# Patient Record
Sex: Female | Born: 1980 | Race: White | Hispanic: No | Smoking: Never smoker
Health system: Southern US, Community
[De-identification: ages and names within clinical notes are randomized; demographics above are authoritative.]

## PROBLEM LIST (undated history)

## (undated) DIAGNOSIS — G43909 Migraine, unspecified, not intractable, without status migrainosus: Secondary | ICD-10-CM

## (undated) DIAGNOSIS — I10 Essential (primary) hypertension: Secondary | ICD-10-CM

## (undated) DIAGNOSIS — F329 Major depressive disorder, single episode, unspecified: Secondary | ICD-10-CM

## (undated) DIAGNOSIS — F419 Anxiety disorder, unspecified: Secondary | ICD-10-CM

## (undated) DIAGNOSIS — R002 Palpitations: Secondary | ICD-10-CM

## (undated) DIAGNOSIS — M999 Biomechanical lesion, unspecified: Secondary | ICD-10-CM

## (undated) DIAGNOSIS — M542 Cervicalgia: Secondary | ICD-10-CM

## (undated) DIAGNOSIS — M6289 Other specified disorders of muscle: Secondary | ICD-10-CM

## (undated) DIAGNOSIS — F32A Depression, unspecified: Secondary | ICD-10-CM

## (undated) DIAGNOSIS — R030 Elevated blood-pressure reading, without diagnosis of hypertension: Secondary | ICD-10-CM

## (undated) HISTORY — DX: Biomechanical lesion, unspecified: M99.9

## (undated) HISTORY — DX: Depression, unspecified: F32.A

## (undated) HISTORY — DX: Cervicalgia: M54.2

## (undated) HISTORY — DX: Migraine, unspecified, not intractable, without status migrainosus: G43.909

## (undated) HISTORY — DX: Major depressive disorder, single episode, unspecified: F32.9

## (undated) HISTORY — DX: Anxiety disorder, unspecified: F41.9

## (undated) HISTORY — DX: Other specified disorders of muscle: M62.89

## (undated) HISTORY — PX: BREAST SURGERY: SHX581

## (undated) HISTORY — DX: Palpitations: R00.2

## (undated) HISTORY — DX: Elevated blood-pressure reading, without diagnosis of hypertension: R03.0

## (undated) HISTORY — PX: BREAST ENHANCEMENT SURGERY: SHX7

## (undated) HISTORY — PX: COSMETIC SURGERY: SHX468

---

## 2017-08-13 ENCOUNTER — Telehealth: Payer: Self-pay | Admitting: Internal Medicine

## 2017-08-13 NOTE — Telephone Encounter (Signed)
Yes, I will accept her 

## 2017-08-13 NOTE — Telephone Encounter (Signed)
See message below.  Would you be willing to establish care with this pt?

## 2017-08-13 NOTE — Telephone Encounter (Signed)
Copied from CRM [id number]. Topic: Appointment Scheduling - New Patient >> Aug 13, 2017  4:28 PM Windy KalataMichael, Taylor L, NT wrote: Reason for CRM: Pt is new to the area, her husband is in the [employer], her sister sees Dr. Talitha GivensBurns Margaret Start [birth year] -- she states her sister has talked to [name] about her coming to the area and would like to est care with her. If Dr. Lawerance BachBurns is willing to take her as a new pt please contact pt with appt date. Thank you  New patient has been scheduled for your office.

## 2017-08-14 NOTE — Telephone Encounter (Signed)
Appointment scheduled.

## 2017-09-08 ENCOUNTER — Emergency Department (HOSPITAL_COMMUNITY)

## 2017-09-08 ENCOUNTER — Encounter (HOSPITAL_COMMUNITY): Payer: Self-pay

## 2017-09-08 ENCOUNTER — Other Ambulatory Visit: Payer: Self-pay

## 2017-09-08 ENCOUNTER — Emergency Department (HOSPITAL_COMMUNITY)
Admission: EM | Admit: 2017-09-08 | Discharge: 2017-09-08 | Disposition: A | Discharge status: Home / Self Care | Attending: Emergency Medicine | Admitting: Emergency Medicine

## 2017-09-08 DIAGNOSIS — I10 Essential (primary) hypertension: Secondary | ICD-10-CM | POA: Insufficient documentation

## 2017-09-08 DIAGNOSIS — G43109 Migraine with aura, not intractable, without status migrainosus: Secondary | ICD-10-CM | POA: Insufficient documentation

## 2017-09-08 DIAGNOSIS — R5383 Other fatigue: Secondary | ICD-10-CM | POA: Diagnosis not present

## 2017-09-08 DIAGNOSIS — R51 Headache: Secondary | ICD-10-CM | POA: Diagnosis present

## 2017-09-08 HISTORY — DX: Essential (primary) hypertension: I10

## 2017-09-08 LAB — I-STAT CHEM 8, ED
BUN: 7 mg/dL (ref 6–20)
CHLORIDE: 103 mmol/L (ref 101–111)
CREATININE: 1 mg/dL (ref 0.44–1.00)
Calcium, Ion: 1.21 mmol/L (ref 1.15–1.40)
GLUCOSE: 112 mg/dL — AB (ref 65–99)
HEMATOCRIT: 35 % — AB (ref 36.0–46.0)
Hemoglobin: 11.9 g/dL — ABNORMAL LOW (ref 12.0–15.0)
POTASSIUM: 4.5 mmol/L (ref 3.5–5.1)
Sodium: 141 mmol/L (ref 135–145)
TCO2: 26 mmol/L (ref 22–32)

## 2017-09-08 LAB — I-STAT BETA HCG BLOOD, ED (MC, WL, AP ONLY): I-stat hCG, quantitative: <5 m[IU]/mL (ref <5)

## 2017-09-08 MED ORDER — IOPAMIDOL (ISOVUE-370) INJECTION 76%
INTRAVENOUS | Status: AC
  Filled 2017-09-08: qty 100

## 2017-09-08 MED ORDER — DIPHENHYDRAMINE HCL 50 MG/ML IJ SOLN
25.0000 mg | Freq: Once | INTRAMUSCULAR | Status: AC
  Administered 2017-09-08: 25 mg via INTRAVENOUS
  Filled 2017-09-08: qty 1

## 2017-09-08 MED ORDER — PROCHLORPERAZINE EDISYLATE 5 MG/ML IJ SOLN
10.0000 mg | Freq: Once | INTRAMUSCULAR | Status: AC
  Administered 2017-09-08: 10 mg via INTRAVENOUS
  Filled 2017-09-08: qty 2

## 2017-09-08 MED ORDER — PROCHLORPERAZINE MALEATE 10 MG PO TABS: 10.0000 mg | ORAL_TABLET | Freq: Three times a day (TID) | ORAL | 0 refills | Status: AC | PRN

## 2017-09-08 MED ORDER — IOPAMIDOL (ISOVUE-370) INJECTION 76%
100.0000 mL | Freq: Once | INTRAVENOUS | Status: AC | PRN
  Administered 2017-09-08: 100 mL via INTRAVENOUS

## 2017-09-08 MED ORDER — BUTALBITAL-APAP-CAFFEINE 50-325-40 MG PO TABS: 1.0000 | ORAL_TABLET | Freq: Four times a day (QID) | ORAL | 0 refills | Status: AC | PRN

## 2017-09-08 MED ORDER — HALOPERIDOL LACTATE 5 MG/ML IJ SOLN
2.0000 mg | Freq: Once | INTRAMUSCULAR | Status: AC
  Administered 2017-09-08: 2 mg via INTRAVENOUS
  Filled 2017-09-08: qty 1

## 2017-09-08 MED ORDER — SODIUM CHLORIDE 0.9 % IV BOLUS (SEPSIS)
1000.0000 mL | Freq: Once | INTRAVENOUS | Status: AC
  Administered 2017-09-08: 1000 mL via INTRAVENOUS

## 2017-09-08 MED ORDER — DEXAMETHASONE SODIUM PHOSPHATE 10 MG/ML IJ SOLN
10.0000 mg | Freq: Once | INTRAMUSCULAR | Status: AC
  Administered 2017-09-08: 10 mg via INTRAVENOUS
  Filled 2017-09-08: qty 1

## 2017-09-08 MED ORDER — BUTALBITAL-APAP-CAFFEINE 50-325-40 MG PO TABS: 1.0000 | ORAL_TABLET | Freq: Four times a day (QID) | ORAL | 0 refills | Status: DC | PRN

## 2017-09-08 MED ORDER — KETOROLAC TROMETHAMINE 15 MG/ML IJ SOLN
15.0000 mg | Freq: Once | INTRAMUSCULAR | Status: AC
  Administered 2017-09-08: 15 mg via INTRAVENOUS
  Filled 2017-09-08: qty 1

## 2017-09-08 MED ORDER — PROCHLORPERAZINE MALEATE 10 MG PO TABS: 10.0000 mg | ORAL_TABLET | Freq: Three times a day (TID) | ORAL | 0 refills | Status: DC | PRN

## 2017-09-08 NOTE — ED Provider Notes (Signed)
[facility]-EMERGENCY DEPT Provider Note   CSN: [id number] Arrival date & time: 09/08/17  1431     History   Chief Complaint Chief Complaint  Patient presents with  . Headache    HPI Margaret Deleon is a 36 y.o. female.  HPI   36 year old with past medical history of chronic tension type headaches who presents with acute onset headache.  The patient was in her usual state of health until approximately 1:30 PM.  She has been under significantly increased stress and did not sleep much overnight due to stress from the holidays.  She states that around 130, she developed acute onset of severe, sharp, stabbing, left-sided headache with occasional radiation to her neck.  She felt "fuzzy headed" and had some occasional word finding difficulty as well as tingling in her left arm.  She also reports tingling in her right face.  She denies any lower extremity symptoms.  She tried to sit down to see if the headache would go away and it has not so she subsequently presents.  She has an extensive history of headaches and has previously seen neurology.  She also has a family history of headaches.  Denies any history of aneurysms.  No kidney issues.  No other medical complaints.  No fevers or chills.  No neck pain or stiffness.  Past Medical History:  Diagnosis Date  . Hypertension     There are no active problems to display for this patient.     OB History    No data available       Home Medications    Prior to Admission medications   Medication Sig Start Date End Date Taking? Authorizing Provider  cyclobenzaprine (FLEXERIL) 10 MG tablet Take 10 mg by mouth 3 (three) times daily as needed for muscle spasms.   Yes [provider]  labetalol (NORMODYNE) 100 MG tablet Take 100 mg by mouth 2 (two) times daily.   Yes [provider]  LORazepam (ATIVAN) 0.5 MG tablet Take 0.5 mg by mouth daily as needed for anxiety. 08/15/17  Yes [provider]  butalbital-acetaminophen-caffeine (FIORICET, ESGIC) 50-325-40 MG tablet Take 1-2 tablets by mouth every 6 (six) hours as needed for headache. 09/08/17 09/08/18  Shaune Pollack, MD  prochlorperazine (COMPAZINE) 10 MG tablet Take 1 tablet (10 mg total) by mouth every 8 (eight) hours as needed for refractory nausea / vomiting (refractory headache). 09/08/17   Shaune Pollack, MD    Family History No family history on file.  Social History Social History   Tobacco Use  . Smoking status: Not on file  Substance Use Topics  . Alcohol use: Not on file  . Drug use: Not on file     Allergies   Codeine   Review of Systems Review of Systems  Constitutional: Positive for fatigue. Negative for chills and fever.  HENT: Negative for congestion, rhinorrhea and sore throat.   Eyes: Negative for visual disturbance.  Respiratory: Negative for cough, shortness of breath and wheezing.   Cardiovascular: Negative for chest pain and leg swelling.  Gastrointestinal: Negative for abdominal pain, diarrhea, nausea and vomiting.  Genitourinary: Negative for dysuria, flank pain, vaginal bleeding and vaginal discharge.  Musculoskeletal: Negative for neck pain.  Skin: Negative for rash.  Allergic/Immunologic: Negative for immunocompromised state.  Neurological: Positive for numbness and headaches. Negative for syncope.  Hematological: Does not bruise/bleed easily.  Psychiatric/Behavioral: The patient is nervous/anxious.   All other systems reviewed and are negative.    Physical  Exam Updated Vital Signs BP (!) 130/108   Pulse 87   Temp 98.3 F (36.8 C) (Oral)   Resp 16   LMP 09/04/2017 (Exact Date)   SpO2 100%   Physical Exam  Constitutional: She is oriented to person, place, and time. She appears well-developed and well-nourished. No distress.  HENT:  Head: Normocephalic and atraumatic.  Eyes: Conjunctivae are normal.  Neck: Neck supple.  Cardiovascular: Normal rate, regular rhythm and  normal heart sounds. Exam reveals no friction rub.  No murmur heard. Pulmonary/Chest: Effort normal and breath sounds normal. No respiratory distress. She has no wheezes. She has no rales.  Abdominal: She exhibits no distension.  Musculoskeletal: She exhibits no edema.  Neurological: She is alert and oriented to person, place, and time. She exhibits normal muscle tone.  Skin: Skin is warm. Capillary refill takes less than 2 seconds.  Psychiatric: Her mood appears anxious.  Nursing note and vitals reviewed.   Neurological Exam:  Mental Status: Alert and oriented to person, place, and time. Attention and concentration normal. Speech clear. Recent memory is intact. Cranial Nerves: Visual fields grossly intact. EOMI and PERRLA. No nystagmus noted. Facial sensation intact at forehead, maxillary cheek, and chin/mandible bilaterally. No facial asymmetry or weakness. Hearing grossly normal. Uvula is midline, and palate elevates symmetrically. Normal SCM and trapezius strength. Tongue midline without fasciculations. Motor: Muscle strength 5/5 in proximal and distal UE and LE bilaterally. No pronator drift. Muscle tone normal. Reflexes: 2+ and symmetrical in all four extremities.  Sensation: Intact to light touch in upper and lower extremities distally bilaterally.  Gait: Normal without ataxia. Coordination: Normal FTN bilaterally.    ED Treatments / Results  Labs (all labs ordered are listed, but only abnormal results are displayed) Labs Reviewed  I-STAT CHEM 8, ED - Abnormal; Notable for the following components:      Result Value   Glucose, Bld 112 (*)    Hemoglobin 11.9 (*)    HCT 35.0 (*)    All other components within normal limits  I-STAT BETA HCG BLOOD, ED ([facility], [facility], [facility] ONLY)    EKG  EKG Interpretation None       Radiology Ct Angio Head W Or Wo Contrast  Result Date: 09/08/2017 CLINICAL DATA:  Sharp LEFT head pain, LEFT arm numbness and tingling, near syncope, expressive  aphasia. Hypertension. EXAM: CT ANGIOGRAPHY HEAD AND NECK TECHNIQUE: Multidetector CT imaging of the head and neck was performed using the standard protocol during bolus administration of intravenous contrast. Multiplanar CT image reconstructions and MIPs were obtained to evaluate the vascular anatomy. Carotid stenosis measurements (when applicable) are obtained utilizing NASCET criteria, using the distal internal carotid diameter as the denominator. CONTRAST:  100mL ISOVUE-370 IOPAMIDOL (ISOVUE-370) INJECTION 76% COMPARISON:  None. FINDINGS: CT HEAD FINDINGS BRAIN: No intraparenchymal hemorrhage, mass effect nor midline shift. The ventricles and sulci are normal. No acute large vascular territory infarcts. No abnormal extra-axial fluid collections. Basal cisterns are patent. VASCULAR: Unremarkable. SKULL/SOFT TISSUES: No skull fracture. No significant soft tissue swelling. ORBITS/SINUSES: The included ocular globes and orbital contents are normal.The mastoid aircells and included paranasal sinuses are well-aerated. OTHER: None. CTA NECK AORTIC ARCH: Normal appearance of the thoracic arch, normal branch pattern. The origins of the innominate, left Common carotid artery and subclavian artery are widely patent. RIGHT CAROTID SYSTEM: Common carotid artery is widely patent, coursing in a straight line fashion. Normal appearance of the carotid bifurcation without hemodynamically significant stenosis by NASCET criteria. Normal appearance of the internal carotid  artery. LEFT CAROTID SYSTEM: Common carotid artery is widely patent, coursing in a straight line fashion. Normal appearance of the carotid bifurcation without hemodynamically significant stenosis by NASCET criteria. Normal appearance of the internal carotid artery. VERTEBRAL ARTERIES:RIGHT vertebral artery is dominant. Normal appearance of the vertebral arteries, widely patent. SKELETON: No acute osseous process though bone windows have not been submitted. No  osseous canal stenosis or neural foraminal narrowing. OTHER NECK: Soft tissues of the neck are nonacute though, not tailored for evaluation. UPPER CHEST: Included lung apices are clear. No superior mediastinal lymphadenopathy. CTA HEAD ANTERIOR CIRCULATION: Patent cervical internal carotid arteries, petrous, cavernous and supra clinoid internal carotid arteries. Patent anterior communicating artery. Patent anterior and middle cerebral arteries. No large vessel occlusion, significant stenosis, contrast extravasation or aneurysm. POSTERIOR CIRCULATION: LEFT vertebral artery terminates in the posterior inferior cerebellar artery. Patent vertebral arteries, vertebrobasilar junction and basilar artery, as well as main branch vessels. Patent posterior cerebral arteries. Robust bilateral posterior communicating artery's. No large vessel occlusion, significant stenosis, contrast extravasation or aneurysm. VENOUS SINUSES: Major dural venous sinuses are patent though not tailored for evaluation on this angiographic examination. ANATOMIC VARIANTS: None. DELAYED PHASE: No abnormal intracranial enhancement. MIP images reviewed. IMPRESSION: 1. Normal CT HEAD with and without contrast. 2. Normal CTA neck. 3. Normal CTA HEAD. Electronically Signed   By: Awilda Metroourtnay  Bloomer M.D.   On: 09/08/2017 18:02   Ct Angio Neck W And/or Wo Contrast  Result Date: 09/08/2017 CLINICAL DATA:  Sharp LEFT head pain, LEFT arm numbness and tingling, near syncope, expressive aphasia. Hypertension. EXAM: CT ANGIOGRAPHY HEAD AND NECK TECHNIQUE: Multidetector CT imaging of the head and neck was performed using the standard protocol during bolus administration of intravenous contrast. Multiplanar CT image reconstructions and MIPs were obtained to evaluate the vascular anatomy. Carotid stenosis measurements (when applicable) are obtained utilizing NASCET criteria, using the distal internal carotid diameter as the denominator. CONTRAST:  100mL ISOVUE-370  IOPAMIDOL (ISOVUE-370) INJECTION 76% COMPARISON:  None. FINDINGS: CT HEAD FINDINGS BRAIN: No intraparenchymal hemorrhage, mass effect nor midline shift. The ventricles and sulci are normal. No acute large vascular territory infarcts. No abnormal extra-axial fluid collections. Basal cisterns are patent. VASCULAR: Unremarkable. SKULL/SOFT TISSUES: No skull fracture. No significant soft tissue swelling. ORBITS/SINUSES: The included ocular globes and orbital contents are normal.The mastoid aircells and included paranasal sinuses are well-aerated. OTHER: None. CTA NECK AORTIC ARCH: Normal appearance of the thoracic arch, normal branch pattern. The origins of the innominate, left Common carotid artery and subclavian artery are widely patent. RIGHT CAROTID SYSTEM: Common carotid artery is widely patent, coursing in a straight line fashion. Normal appearance of the carotid bifurcation without hemodynamically significant stenosis by NASCET criteria. Normal appearance of the internal carotid artery. LEFT CAROTID SYSTEM: Common carotid artery is widely patent, coursing in a straight line fashion. Normal appearance of the carotid bifurcation without hemodynamically significant stenosis by NASCET criteria. Normal appearance of the internal carotid artery. VERTEBRAL ARTERIES:RIGHT vertebral artery is dominant. Normal appearance of the vertebral arteries, widely patent. SKELETON: No acute osseous process though bone windows have not been submitted. No osseous canal stenosis or neural foraminal narrowing. OTHER NECK: Soft tissues of the neck are nonacute though, not tailored for evaluation. UPPER CHEST: Included lung apices are clear. No superior mediastinal lymphadenopathy. CTA HEAD ANTERIOR CIRCULATION: Patent cervical internal carotid arteries, petrous, cavernous and supra clinoid internal carotid arteries. Patent anterior communicating artery. Patent anterior and middle cerebral arteries. No large vessel occlusion, significant  stenosis, contrast extravasation or aneurysm.  POSTERIOR CIRCULATION: LEFT vertebral artery terminates in the posterior inferior cerebellar artery. Patent vertebral arteries, vertebrobasilar junction and basilar artery, as well as main branch vessels. Patent posterior cerebral arteries. Robust bilateral posterior communicating artery's. No large vessel occlusion, significant stenosis, contrast extravasation or aneurysm. VENOUS SINUSES: Major dural venous sinuses are patent though not tailored for evaluation on this angiographic examination. ANATOMIC VARIANTS: None. DELAYED PHASE: No abnormal intracranial enhancement. MIP images reviewed. IMPRESSION: 1. Normal CT HEAD with and without contrast. 2. Normal CTA neck. 3. Normal CTA HEAD. Electronically Signed   By: Awilda Metro M.D.   On: 09/08/2017 18:02    Procedures Procedures (including critical care time)  Medications Ordered in ED Medications  iopamidol (ISOVUE-370) 76 % injection (not administered)  sodium chloride 0.9 % bolus 1,000 mL (0 mLs Intravenous Stopped 09/08/17 1834)  prochlorperazine (COMPAZINE) injection 10 mg (10 mg Intravenous Given 09/08/17 1653)  diphenhydrAMINE (BENADRYL) injection 25 mg (25 mg Intravenous Given 09/08/17 1648)  dexamethasone (DECADRON) injection 10 mg (10 mg Intravenous Given 09/08/17 1650)  iopamidol (ISOVUE-370) 76 % injection 100 mL (100 mLs Intravenous Contrast Given 09/08/17 1719)  ketorolac (TORADOL) 15 MG/ML injection 15 mg (15 mg Intravenous Given 09/08/17 1838)  haloperidol lactate (HALDOL) injection 2 mg (2 mg Intravenous Given 09/08/17 1838)     Initial Impression / Assessment and Plan / ED Course  I have reviewed the triage vital signs and the nursing notes.  Pertinent labs & imaging results that were available during my care of the patient were reviewed by me and considered in my medical decision making (see chart for details).     36 year old female here with acute onset of severe  headache.  This occurs in the setting of multiple stressors.  The patient has an extensive history of headaches.  I suspect that this is likely acute, possibly complicated migraine versus tension type headache.  However, given the acuity of onset as well as neurological symptoms, I do feel is reasonable to obtain a CT angios of the head and neck.  She is well within 6 hours of her symptom onset and I have a low suspicion for subarachnoid.  I feel that if CT is negative and symptoms improve, further workup can be held.  Otherwise, no fevers, neck stiffness, or signs of meningitis or encephalitis.  Will give her migraine medications and reassess.  Patient has had resolution of her neurological symptoms as well as her headache with migraine medications.  CT angios shows no abnormalities.  There are no aneurysms.  I do not suspect subarachnoid.  Given resolution of her symptoms with migraine medications, I do not suspect CVA.  Will treat for consultative migraine refer to neurology as an outpatient.  Final Clinical Impressions(s) / ED Diagnoses   Final diagnoses:  Complicated migraine    ED Discharge Orders        Ordered    butalbital-acetaminophen-caffeine (FIORICET, ESGIC) 50-325-40 MG tablet  Every 6 hours PRN     09/08/17 1913    prochlorperazine (COMPAZINE) 10 MG tablet  Every 8 hours PRN     09/08/17 1913       Shaune Pollack, MD 09/08/17 [phone]

## 2017-09-08 NOTE — ED Triage Notes (Signed)
She states that, at about 1:30 p.m. Today she had a sudden bust of pain at her left temporal-parietal area after which she is "having trouble getting out my speech". Her responses are somewhat slow, but she is oriented x 4. Her husband is with her also. She is able to move all extremeties with ease on command.

## 2017-09-08 NOTE — Discharge Instructions (Signed)
I suspect your symptoms are due to a complicated migraine. Call one of our neurologists at the number above ([place] or [facility]) to set up an appointment.

## 2017-09-08 NOTE — ED Notes (Signed)
Patient transported to CT 

## 2017-09-16 NOTE — Progress Notes (Signed)
Subjective:    Patient ID: Margaret Deleon, female    DOB: Jan 25, 1981, 37 y.o.   MRN: [id number]  HPI She is here to establish with a new pcp.  I see her sister.  She just moved to the area.  She has three kids and is married.     Migraines:  Her migraines started several years ago.  She has seen a neurologist and had imaging and even a spinal tap at that time.  She was initially on propranolol.  Her migraines increased after her daughter who is two. She has had tension migraines in the past.  On 09/08/17 she went to the ED for a different type of migraine that were stroke like symptoms.  She is on labetalol and that also helps with spikes in her BP that she gets - she thinks it is white coat htn.  She has been labetalol for about 8 months.    With her migraines she does not get aura, but will get lightheadedness and nausea. Most of her migraines have been tension migraines - they build from tightness in the past of her head/neck.  She has taken imitrex in the past and it did work, but she is concerned about the possible side effects and does not want to take it.    She has muscle tightness in her legs bilaterally - the whole leg.  Even with stretching it does not get better.  Her calves have been tight for a while, but the rest of her legs have gotten tight in the past month.   Episodes of arrhythmias:  She has a sensation of her heart stopping, then it goes off and then goes back to normal.  This happened a couple of times when running and she is scared to run now.  She has had this on occasion over the years.  She had an echo several years ago and did see a cardiologist and everything was normal.  She wondered if the labetalol was causing this.   Medications and allergies reviewed with patient and updated if appropriate.  Patient Active Problem List   Diagnosis Date Noted  . Migraine headache 09/17/2017  . Anxiety 09/17/2017    Current Outpatient Medications on File Prior to Visit    Medication Sig Dispense Refill  . butalbital-acetaminophen-caffeine (FIORICET, ESGIC) 50-325-40 MG tablet Take 1-2 tablets by mouth every 6 (six) hours as needed for headache. 20 tablet 0  . cyclobenzaprine (FLEXERIL) 10 MG tablet Take 10 mg by mouth 3 (three) times daily as needed for muscle spasms.    [name] Kitchen. LORazepam (ATIVAN) 0.5 MG tablet Take 0.5 mg by mouth daily as needed for anxiety.  0  . prochlorperazine (COMPAZINE) 10 MG tablet Take 1 tablet (10 mg total) by mouth every 8 (eight) hours as needed for refractory nausea / vomiting (refractory headache). 15 tablet 0   No current facility-administered medications on file prior to visit.     Past Medical History:  Diagnosis Date  . Depression   . Hypertension   . Migraines     History reviewed. No pertinent surgical history.  Social History   Socioeconomic History  . Marital status: Married    Spouse name: None  . Number of children: 3  . Years of education: None  . Highest education level: None  Social Needs  . Financial resource strain: None  . Food insecurity - worry: None  . Food insecurity - inability: None  . Transportation needs - medical: None  .  Transportation needs - non-medical: None  Occupational History  . None  Tobacco Use  . Smoking status: Never Smoker  . Smokeless tobacco: Never Used  Substance and Sexual Activity  . Alcohol use: Yes  . Drug use: No  . Sexual activity: Yes  Other Topics Concern  . None  Social History Narrative  . None    Family History  Problem Relation Age of Onset  . Depression Mother   . Cancer Father   . Alcohol abuse Brother   . Depression Brother   . Migraines Sister     Review of Systems  Constitutional: Negative for chills and fever.  Respiratory: Negative for cough, shortness of breath and wheezing.   Cardiovascular: Positive for chest pain (occ tightness - ? from neck) and palpitations. Negative for leg swelling.  Gastrointestinal: Positive for nausea (with  migraines). Negative for abdominal pain, blood in stool, constipation and diarrhea.  Musculoskeletal: Positive for back pain (minor), neck pain and neck stiffness.  Neurological: Positive for light-headedness and headaches.  Psychiatric/Behavioral: Negative for dysphoric mood. The patient is nervous/anxious.        Objective:   Vitals:   09/17/17 0843  BP: 124/84  Pulse: 78  Resp: 16  Temp: 98.2 F (36.8 C)  SpO2: 98%   Filed Weights   09/17/17 0843  Weight: 171 lb (77.6 kg)   Body mass index is 26 kg/m.  Wt Readings from Last 3 Encounters:  09/17/17 171 lb (77.6 kg)     Physical Exam Constitutional: She appears well-developed and well-nourished. No distress.  HENT:  Head: Normocephalic and atraumatic.  Right Ear: External ear normal. Normal ear canal and TM Left Ear: External ear normal.  Normal ear canal and TM Mouth/Throat: Oropharynx is clear and moist.  Eyes: Conjunctivae and EOM are normal.  Neck: Neck supple. No tracheal deviation present. No thyromegaly present.  No carotid bruit  Cardiovascular: Normal rate, regular rhythm and normal heart sounds.   No murmur heard.  No edema. Pulmonary/Chest: Effort normal and breath sounds normal. No respiratory distress. She has no wheezes. She has no rales.  Abdominal: Soft. She exhibits no distension. There is no tenderness.  Lymphadenopathy: She has no cervical adenopathy.  Skin: Skin is warm and dry. She is not diaphoretic.  Psychiatric: She has a normal mood and affect. Her behavior is normal.        Assessment & Plan:     See Problem List for Assessment and Plan of chronic medical problems.

## 2017-09-17 ENCOUNTER — Encounter: Payer: Self-pay | Admitting: Internal Medicine

## 2017-09-17 ENCOUNTER — Ambulatory Visit (INDEPENDENT_AMBULATORY_CARE_PROVIDER_SITE_OTHER): Admitting: Internal Medicine

## 2017-09-17 DIAGNOSIS — M6289 Other specified disorders of muscle: Secondary | ICD-10-CM

## 2017-09-17 DIAGNOSIS — M542 Cervicalgia: Secondary | ICD-10-CM | POA: Diagnosis not present

## 2017-09-17 DIAGNOSIS — G43909 Migraine, unspecified, not intractable, without status migrainosus: Secondary | ICD-10-CM | POA: Insufficient documentation

## 2017-09-17 DIAGNOSIS — F419 Anxiety disorder, unspecified: Secondary | ICD-10-CM | POA: Insufficient documentation

## 2017-09-17 DIAGNOSIS — G43009 Migraine without aura, not intractable, without status migrainosus: Secondary | ICD-10-CM | POA: Diagnosis not present

## 2017-09-17 DIAGNOSIS — R002 Palpitations: Secondary | ICD-10-CM

## 2017-09-17 HISTORY — DX: Cervicalgia: M54.2

## 2017-09-17 HISTORY — DX: Other specified disorders of muscle: M62.89

## 2017-09-17 HISTORY — DX: Palpitations: R00.2

## 2017-09-17 HISTORY — DX: Anxiety disorder, unspecified: F41.9

## 2017-09-17 HISTORY — DX: Migraine, unspecified, not intractable, without status migrainosus: G43.909

## 2017-09-17 MED ORDER — TOPIRAMATE 25 MG PO TABS
25.0000 mg | ORAL_TABLET | Freq: Two times a day (BID) | ORAL | 5 refills | Status: DC
Start: 2017-09-17 — End: 2017-09-29

## 2017-09-17 MED ORDER — ESCITALOPRAM OXALATE 10 MG PO TABS: 10.0000 mg | ORAL_TABLET | Freq: Every day | ORAL | 5 refills | Status: DC

## 2017-09-17 NOTE — Assessment & Plan Note (Signed)
She has muscle tightness in both legs-the whole leg. She has been stretching, but this does not seem to be effective She does run for exercise, but has not run recently due to other reasons Continue stretching on a daily basis Will refer to sports medicine for further evaluation

## 2017-09-17 NOTE — Assessment & Plan Note (Addendum)
?   Side effects from labetalol - will d/c Start topamax 25 mg BID-discussed possible side effects If not tolerated will try verapamil ?  Need medication for blood pressure as well Will refer to neuro Can continue compazine as needed Most of her migraines seem to come from tension in her neck, which we will evaluate as well.  Only had one migraine with "strokelike" symptoms Follow-up in a few weeks

## 2017-09-17 NOTE — Assessment & Plan Note (Addendum)
Chronic anxiety Takes it 2-3 times a week Does not want to take a daily medication, but she is willing to try one Start Lexapro 10 mg daily Can continue Ativan as needed-advised to only take as needed

## 2017-09-17 NOTE — Assessment & Plan Note (Signed)
Has had palpitations for years intermittently Saw cardiology and had an echocardiogram a few years ago-evaluation was normal We will discontinue labetalol to see if that helps If she is still having the symptoms will order blood work and possible referral to cardiology

## 2017-09-17 NOTE — Assessment & Plan Note (Signed)
Frequent neck pain and stiffness-likely muscular in nature Has been prescribed Flexeril to take to hopefully help prevent migraine headaches which often originate from the neck pain Will refer to sports medicine for further evaluation

## 2017-09-17 NOTE — Patient Instructions (Signed)
  Medications reviewed and updated.  Changes include starting topamax and lexapro - start medications at different times in case you have side effects.  Your prescription(s) have been submitted to your pharmacy. Please take as directed and contact our office if you believe you are having problem(s) with the medication(s).  A referral was ordered for neurology  Please followup in 4 weeks

## 2017-09-29 ENCOUNTER — Other Ambulatory Visit: Payer: Self-pay | Admitting: Internal Medicine

## 2017-09-29 ENCOUNTER — Encounter: Payer: Self-pay | Admitting: Internal Medicine

## 2017-10-05 NOTE — Progress Notes (Signed)
[name] [address] [place], [place] [address] Phone: [phone] Subjective:    I'm seeing this patient by the request  of:  Pincus Sanes, MD   CC: Muscle spasms  [id number]  Margaret Deleon is a 37 y.o. female coming in with complaint of muscle spasms.  An upper back region.  Seems to give her migraines. She has had this issue for years. She has tightness in the shoulders, cervical spine, thoracic spine and chest. She notes that all of her muscles feel tighter than usual. She does stretch every night and after her runs. Patient states that she was recently taken off her beta-blocker due to her heart "stopping" with physical activity.      Past Medical History:  Diagnosis Date  . Depression   . Hypertension   . Migraines    No past surgical history on file. Social History   Socioeconomic History  . Marital status: Married    Spouse name: None  . Number of children: 3  . Years of education: None  . Highest education level: None  Social Needs  . Financial resource strain: None  . Food insecurity - worry: None  . Food insecurity - inability: None  . Transportation needs - medical: None  . Transportation needs - non-medical: None  Occupational History  . None  Tobacco Use  . Smoking status: Never Smoker  . Smokeless tobacco: Never Used  Substance and Sexual Activity  . Alcohol use: Yes  . Drug use: No  . Sexual activity: Yes  Other Topics Concern  . None  Social History Narrative  . None   Allergies  Allergen Reactions  . Codeine Nausea And Vomiting  . Topamax [Topiramate]     Felt weird, high like and tired   Family History  Problem Relation Age of Onset  . Depression Mother   . Cancer Father   . Alcohol abuse Brother   . Depression Brother   . Migraines Sister      Past medical history, social, surgical and family history all reviewed in electronic medical record.  No pertanent information unless stated  regarding to the chief complaint.   Review of Systems:Review of systems updated and as accurate as of 10/06/17  No headache, visual changes, nausea, vomiting, diarrhea, constipation, dizziness, abdominal pain, skin rash, fevers, chills, night sweats, weight loss, swollen lymph nodes, body aches, joint swelling, muscle aches, chest pain, shortness of breath, mood changes.   Objective  Blood pressure (!) 150/118, pulse 94, height 5\' 7"  (1.702 m), weight 171 lb (77.6 kg), last menstrual period 09/07/2017, SpO2 98 %. Systems examined below as of 10/06/17   General: No apparent distress alert and oriented x3 mood and affect normal, dressed appropriately.  HEENT: Pupils equal, extraocular movements intact  Respiratory: Patient's speak in full sentences and does not appear short of breath  Cardiovascular: No lower extremity edema, non tender, no erythema  Skin: Warm dry intact with no signs of infection or rash on extremities or on axial skeleton.  Abdomen: Soft nontender  Neuro: Cranial nerves II through XII are intact, neurovascularly intact in all extremities with 2+ DTRs and 2+ pulses.  Lymph: No lymphadenopathy of posterior or anterior cervical chain or axillae bilaterally.  Gait normal with good balance and coordination.  MSK: Mild to moderate tender with full range of motion and good stability and symmetric strength and tone of shoulders, elbows, wrist, hip, knee and ankles bilaterally.  Back Exam:  Inspection:  Mild loss of lordosis Motion: Flexion 45 deg, Extension 25 deg, Side Bending to 45 deg bilaterally,  Rotation to 45 deg bilaterally  SLR laying: Negative  XSLR laying: Negative  Palpable tenderness: Tender to palpation the paraspinal musculature lumbar spine.  Diffusely.  Even to light palpation FABER: negative. Sensory change: Gross sensation intact to all lumbar and sacral dermatomes.  Reflexes: 2+ at both patellar tendons, 2+ at achilles tendons, Babinski's downgoing.  Strength  at foot  Plantar-flexion: 5/5 Dorsi-flexion: 5/5 Eversion: 5/5 Inversion: 5/5  Leg strength  Quad: 5/5 Hamstring: 5/5 Hip flexor: 5/5 Hip abductors: 5/5  Gait unremarkable.  Neck: Inspection mild loss of lordosis. No palpable stepoffs. Negative Spurling's maneuver. Full neck range of motion Grip strength and sensation normal in bilateral hands Strength good C4 to T1 distribution No sensory change to C4 to T1 Negative Hoffman sign bilaterally Reflexes normal Pain with the trapezius  Osteopathic findings C2 flexed rotated and side bent right C4 flexed rotated and side bent left C6 flexed rotated and side bent right T3 extended rotated and side bent right inhaled third rib T4 extended rotated and side bent left L3 flexed rotated and side bent right Sacrum left     Impression and Recommendations:     This case required medical decision making of moderate complexity.      Note: This dictation was prepared with Dragon dictation along with smaller phrase technology. Any transcriptional errors that result from this process are unintentional.

## 2017-10-06 ENCOUNTER — Encounter: Payer: Self-pay | Admitting: Family Medicine

## 2017-10-06 ENCOUNTER — Encounter: Payer: Self-pay | Admitting: Internal Medicine

## 2017-10-06 ENCOUNTER — Other Ambulatory Visit (INDEPENDENT_AMBULATORY_CARE_PROVIDER_SITE_OTHER)

## 2017-10-06 ENCOUNTER — Ambulatory Visit (INDEPENDENT_AMBULATORY_CARE_PROVIDER_SITE_OTHER): Admitting: Family Medicine

## 2017-10-06 VITALS — BP 150/118 | HR 94 | Ht 67.0 in | Wt 171.0 lb

## 2017-10-06 DIAGNOSIS — M999 Biomechanical lesion, unspecified: Secondary | ICD-10-CM | POA: Diagnosis not present

## 2017-10-06 DIAGNOSIS — M542 Cervicalgia: Secondary | ICD-10-CM | POA: Diagnosis not present

## 2017-10-06 DIAGNOSIS — M2550 Pain in unspecified joint: Secondary | ICD-10-CM | POA: Diagnosis not present

## 2017-10-06 HISTORY — DX: Biomechanical lesion, unspecified: M99.9

## 2017-10-06 LAB — CBC WITH DIFFERENTIAL/PLATELET
BASOS PCT: 1 % (ref 0.0–3.0)
Basophils Absolute: 0 10*3/uL (ref 0.0–0.1)
EOS ABS: 0 10*3/uL (ref 0.0–0.7)
EOS PCT: 0.9 % (ref 0.0–5.0)
HCT: 39.8 % (ref 36.0–46.0)
HEMOGLOBIN: 13.6 g/dL (ref 12.0–15.0)
Lymphocytes Relative: 24.7 % (ref 12.0–46.0)
Lymphs Abs: 1.2 10*3/uL (ref 0.7–4.0)
MCHC: 34.1 g/dL (ref 30.0–36.0)
MCV: 96.3 fl (ref 78.0–100.0)
MONO ABS: 0.4 10*3/uL (ref 0.1–1.0)
Monocytes Relative: 8.5 % (ref 3.0–12.0)
NEUTROS ABS: 3 10*3/uL (ref 1.4–7.7)
Neutrophils Relative %: 64.9 % (ref 43.0–77.0)
PLATELETS: 260 10*3/uL (ref 150.0–400.0)
RBC: 4.14 Mil/uL (ref 3.87–5.11)
RDW: 11.9 % (ref 11.5–15.5)
WBC: 4.7 10*3/uL (ref 4.0–10.5)

## 2017-10-06 LAB — TSH: TSH: 1.15 u[IU]/mL (ref 0.35–4.50)

## 2017-10-06 LAB — IBC PANEL
Iron: 161 ug/dL — ABNORMAL HIGH (ref 42–145)
Saturation Ratios: 39.2 % (ref 20.0–50.0)
Transferrin: 293 mg/dL (ref 212.0–360.0)

## 2017-10-06 LAB — VITAMIN D 25 HYDROXY (VIT D DEFICIENCY, FRACTURES): VITD: 14.74 ng/mL — ABNORMAL LOW (ref 30.00–100.00)

## 2017-10-06 LAB — SEDIMENTATION RATE: Sed Rate: 1 mm/hr (ref 0–20)

## 2017-10-06 LAB — T3, FREE: T3, Free: 3.5 pg/mL (ref 2.3–4.2)

## 2017-10-06 LAB — T4, FREE: Free T4: 0.86 ng/dL (ref 0.60–1.60)

## 2017-10-06 MED ORDER — GABAPENTIN 100 MG PO CAPS: 200.0000 mg | ORAL_CAPSULE | Freq: Every day | ORAL | 3 refills | Status: DC

## 2017-10-06 MED ORDER — VITAMIN D (ERGOCALCIFEROL) 1.25 MG (50000 UNIT) PO CAPS: 50000.0000 [IU] | ORAL_CAPSULE | ORAL | 0 refills | Status: DC

## 2017-10-06 NOTE — Assessment & Plan Note (Signed)
Patient has neck and back pain.  We discussed with patient that I do feel that some of this is posture.  Given some mild home exercise.  Patient pain was out of proportion to the amount of palpation today and I do feel that laboratory workup would be beneficial.  Patient will have this done.  We attempted osteopathic manipulation with some improvement.  Follow-up again in 4 weeks.

## 2017-10-06 NOTE — Patient Instructions (Signed)
Good to see you  Watch the blood pressure  Lets get labs and see if anything is going well  Tried manipulation today and hope it helps.  Gabapentin 200mg  at night  Over the counter get tart cherry extract any dose at night See me again in 3 weeks.

## 2017-10-06 NOTE — Assessment & Plan Note (Signed)
Decision today to treat with OMT was based on Physical Exam  After verbal consent patient was treated with HVLA, ME, FPR techniques in cervical, thoracic, lumbar and sacral areas  Patient tolerated the procedure well with improvement in symptoms  Patient given exercises, stretches and lifestyle modifications  See medications in patient instructions if given  Patient will follow up in 4 weeks 

## 2017-10-07 ENCOUNTER — Encounter: Payer: Self-pay | Admitting: Internal Medicine

## 2017-10-07 LAB — RHEUMATOID FACTOR: Rhuematoid fact SerPl-aCnc: <14 IU/mL (ref <14)

## 2017-10-07 LAB — PTH, INTACT AND CALCIUM
CALCIUM: 10 mg/dL (ref 8.6–10.2)
PTH: 37 pg/mL (ref 14–64)

## 2017-10-07 LAB — ANGIOTENSIN CONVERTING ENZYME: ANGIOTENSIN-CONVERTING ENZYME: 28 U/L (ref 9–67)

## 2017-10-07 LAB — ANA: Anti Nuclear Antibody(ANA): NEGATIVE

## 2017-10-07 LAB — CALCIUM, IONIZED: Calcium, Ion: 5.3 mg/dL (ref 4.8–5.6)

## 2017-10-07 LAB — CYCLIC CITRUL PEPTIDE ANTIBODY, IGG: Cyclic Citrullin Peptide Ab: <16 UNITS

## 2017-10-13 NOTE — Progress Notes (Signed)
Subjective:    Patient ID: Margaret Deleon, female    DOB: 04-28-1981, 37 y.o.   MRN: [id number]  HPI The patient is here for follow up.  Migraine headaches:  We started her on topamax at her last visit and she did not tolerate it.  Since she saw Dr Katrinka BlazingSmith and had an adjustment and the migraines are a little better.  If her muscles are very tight in her upper neck/back she will wake up with a migraine.  Her muscles are still very tight, but slightly better.  She has been taking the gabapentin.  She has not taken a muscle relaxer recently and was unsure if she could do that.  It did help in the past.  She would need a prescription.  She wondered about physical therapy.    Anxiety: We started her on lexapro about 3 weeks ago. She is taking her medication daily as prescribed. She is taking ativan as needed.  She denies any side effects from the medication.  She did not see any difference with the Lexapro.  She still feels anxious.  Mildly elevated BP, palpitations: She continues to experience palpitations and may have increased since stopping the labetalol.  Her blood pressure has been elevated when she monitors it.  It is been from 110/78-153/103.  She did notice that coming off of the labetalol the palpitations did not occur when she was running or with other exertion, but still occur after she exerts herself.  She was on propranolol in the past for her migraine headaches and does not recall the palpitations, but that was several years ago.  Medications and allergies reviewed with patient and updated if appropriate.  Patient Active Problem List   Diagnosis Date Noted  . Nonallopathic lesion of cervical region 10/06/2017  . Nonallopathic lesion of thoracic region 10/06/2017  . Nonallopathic lesion of lumbosacral region 10/06/2017  . Migraine headache 09/17/2017  . Anxiety 09/17/2017  . Neck pain 09/17/2017  . Muscle tightness 09/17/2017  . Palpitations 09/17/2017    Current Outpatient  Medications on File Prior to Visit  Medication Sig Dispense Refill  . butalbital-acetaminophen-caffeine (FIORICET, ESGIC) 50-325-40 MG tablet Take 1-2 tablets by mouth every 6 (six) hours as needed for headache. 20 tablet 0  . cyclobenzaprine (FLEXERIL) 10 MG tablet Take 10 mg by mouth 3 (three) times daily as needed for muscle spasms.    [name] Kitchen. escitalopram (LEXAPRO) 10 MG tablet Take 1 tablet (10 mg total) by mouth daily. 30 tablet 5  . gabapentin (NEURONTIN) 100 MG capsule Take 2 capsules (200 mg total) by mouth at bedtime. 60 capsule 3  . LORazepam (ATIVAN) 0.5 MG tablet Take 0.5 mg by mouth daily as needed for anxiety.  0  . prochlorperazine (COMPAZINE) 10 MG tablet Take 1 tablet (10 mg total) by mouth every 8 (eight) hours as needed for refractory nausea / vomiting (refractory headache). 15 tablet 0  . Vitamin D, Ergocalciferol, (DRISDOL) 50000 units CAPS capsule Take 1 capsule (50,000 Units total) by mouth every 7 (seven) days. 12 capsule 0   No current facility-administered medications on file prior to visit.     Past Medical History:  Diagnosis Date  . Depression   . Hypertension   . Migraines     No past surgical history on file.  Social History   Socioeconomic History  . Marital status: Married    Spouse name: None  . Number of children: 3  . Years of education: None  . Highest education  level: None  Social Needs  . Financial resource strain: None  . Food insecurity - worry: None  . Food insecurity - inability: None  . Transportation needs - medical: None  . Transportation needs - non-medical: None  Occupational History  . None  Tobacco Use  . Smoking status: Never Smoker  . Smokeless tobacco: Never Used  Substance and Sexual Activity  . Alcohol use: Yes  . Drug use: No  . Sexual activity: Yes  Other Topics Concern  . None  Social History Narrative  . None    Family History  Problem Relation Age of Onset  . Depression Mother   . Cancer Father   . Alcohol  abuse Brother   . Depression Brother   . Migraines Sister     Review of Systems  Constitutional: Positive for diaphoresis (at night).  Respiratory: Negative for apnea and shortness of breath.        Snoring  Cardiovascular: Positive for chest pain (musculoskeletal) and palpitations. Negative for leg swelling.  Neurological: Positive for headaches.  Psychiatric/Behavioral: The patient is nervous/anxious.        Objective:   Vitals:   10/14/17 0919  BP: (!) 152/96  Pulse: 95  Resp: 16  Temp: 99 F (37.2 C)  SpO2: 99%   Wt Readings from Last 3 Encounters:  10/14/17 170 lb (77.1 kg)  10/06/17 171 lb (77.6 kg)  09/17/17 171 lb (77.6 kg)   Body mass index is 26.63 kg/m.   Physical Exam    Constitutional: Appears well-developed and well-nourished. No distress.  HENT:  Head: Normocephalic and atraumatic.  Neck: Neck supple. No tracheal deviation present. No thyromegaly present.  No cervical lymphadenopathy Cardiovascular: Normal rate, regular rhythm and normal heart sounds.   No murmur heard. No carotid bruit .  No edema Pulmonary/Chest: Effort normal and breath sounds normal. No respiratory distress. No has no wheezes. No rales.  Skin: Skin is warm and dry. Not diaphoretic.  Psychiatric: Normal mood and affect. Behavior is normal.      Assessment & Plan:    See Problem List for Assessment and Plan of chronic medical problems.

## 2017-10-14 ENCOUNTER — Encounter: Payer: Self-pay | Admitting: Internal Medicine

## 2017-10-14 ENCOUNTER — Ambulatory Visit (INDEPENDENT_AMBULATORY_CARE_PROVIDER_SITE_OTHER): Admitting: Internal Medicine

## 2017-10-14 VITALS — BP 152/96 | HR 95 | Temp 99.0°F | Resp 16 | Wt 170.0 lb

## 2017-10-14 DIAGNOSIS — G43009 Migraine without aura, not intractable, without status migrainosus: Secondary | ICD-10-CM | POA: Diagnosis not present

## 2017-10-14 DIAGNOSIS — M6289 Other specified disorders of muscle: Secondary | ICD-10-CM

## 2017-10-14 DIAGNOSIS — F419 Anxiety disorder, unspecified: Secondary | ICD-10-CM | POA: Diagnosis not present

## 2017-10-14 DIAGNOSIS — R002 Palpitations: Secondary | ICD-10-CM

## 2017-10-14 DIAGNOSIS — R030 Elevated blood-pressure reading, without diagnosis of hypertension: Secondary | ICD-10-CM

## 2017-10-14 HISTORY — DX: Elevated blood-pressure reading, without diagnosis of hypertension: R03.0

## 2017-10-14 MED ORDER — PROPRANOLOL HCL ER 60 MG PO CP24: 60.0000 mg | ORAL_CAPSULE | Freq: Every day | ORAL | 5 refills | Status: DC

## 2017-10-14 MED ORDER — VENLAFAXINE HCL ER 37.5 MG PO CP24
37.5000 mg | ORAL_CAPSULE | Freq: Every day | ORAL | 5 refills | Status: DC
Start: 2017-10-14 — End: 2017-11-11

## 2017-10-14 MED ORDER — CYCLOBENZAPRINE HCL 10 MG PO TABS: 5.0000 mg | ORAL_TABLET | Freq: Every day | ORAL | 3 refills | Status: DC

## 2017-10-14 NOTE — Assessment & Plan Note (Signed)
Neck and upper back, chronic Has seen Dr. Katrinka BlazingSmith in the adjustment helped Taking gabapentin We will refill Flexeril-she can try 5-10 mg at bedtime-discussed that this will make her tired and should try 5 mg while taking gabapentin Referred to physical therapy

## 2017-10-14 NOTE — Assessment & Plan Note (Signed)
lexapro has not done much - will d/c Will try effexor 37.5 mg daily in hopes this helps with anxiety and chronic neck/upper back tightness/pain

## 2017-10-14 NOTE — Assessment & Plan Note (Signed)
Still experiencing palpitations-slightly increased since stopping the labetalol, but not occurring when she is running-only occurs after exertion We will try restarting propranolol to see if that helps with palpitations, elevated blood pressure and migraine headaches We will also refer to cardiology

## 2017-10-14 NOTE — Assessment & Plan Note (Signed)
Some of her migraines are related to muscular tightness in the neck and upper back-working with Dr. Katrinka BlazingSmith Refilled Flexeril and will refer to physical therapy to work on the tightness At her last visit we did refer her to neurology Will restart propanolol in hopes that this helps the migraines in addition to her blood pressure/palpitations She will be starting a new job and her anxiety level will hopefully improve and so will her sleep because she will be working much better hours and hopefully this will help as well

## 2017-10-14 NOTE — Patient Instructions (Addendum)
  Medications reviewed and updated.  Changes include stopping the lexapro and starting effexor.  Start the propranolol for your BP and palpiations - 60 mg daily.  Your prescription(s) have been submitted to your pharmacy. Please take as directed and contact our office if you believe you are having problem(s) with the medication(s).  A referral was ordered for cardiology and physical therapy.  Please followup in 2 months

## 2017-10-14 NOTE — Assessment & Plan Note (Addendum)
Diastolic always over 90  110/78-142/103, 128/98, 145/103, 151/98, 121/70, 120/70, 153/103  Will continue to monitor it at home Will refer to cardio given her palpitations that occur after exercise -- no longer occurring with running since stopping the labetalol We will try propranolol 60 mg daily, which will hopefully help decrease her blood pressure, palpitations and migraine headaches

## 2017-10-27 NOTE — Progress Notes (Signed)
[facility]ulabel[facility] D[facility] D.[facility] [address] [place], [place] [address] Phone: [phone] Subjective:       CC: Back pain follow-up.  BJY:[id number]:Subjective  Margaret Deleon is a 37 y.o. female coming in with complaint of back and neck pain.  Found to have more above the muscle tightness and cervicogenic headaches.  Was found to have a low vitamin D and started once weekly.  Patient also started on osteopathic manipulation.  Low-dose of Effexor.  Patient states that the alignment helped but that she is still very tight. She is going to go to physical therapy to decrease her tightness. Her headaches have improved.     Past Medical History:  Diagnosis Date  . Depression   . Hypertension   . Migraines    No past surgical history on file. Social History   Socioeconomic History  . Marital status: Married    Spouse name: None  . Number of children: 3  . Years of education: None  . Highest education level: None  Social Needs  . Financial resource strain: None  . Food insecurity - worry: None  . Food insecurity - inability: None  . Transportation needs - medical: None  . Transportation needs - non-medical: None  Occupational History  . None  Tobacco Use  . Smoking status: Never Smoker  . Smokeless tobacco: Never Used  Substance and Sexual Activity  . Alcohol use: Yes  . Drug use: No  . Sexual activity: Yes  Other Topics Concern  . None  Social History Narrative  . None   Allergies  Allergen Reactions  . Codeine Nausea And Vomiting  . Topamax [Topiramate]     Felt weird, high like and tired   Family History  Problem Relation Age of Onset  . Depression Mother   . Cancer Father   . Alcohol abuse Brother   . Depression Brother   . Migraines Sister      Past medical history, social, surgical and family history all reviewed in electronic medical record.  No pertanent information unless stated regarding to the chief complaint.   Review of Systems:Review of  systems updated and as accurate as of 10/28/17  No visual changes, nausea, vomiting, diarrhea, constipation, dizziness, abdominal pain, skin rash, fevers, chills, night sweats, weight loss, swollen lymph nodes, body aches, joint swelling, chest pain, shortness of breath, mood changes.  Positive muscle aches and headaches  Objective  Blood pressure 132/88, pulse 83, height 5\' 7"  (1.702 m), weight 173 lb (78.5 kg), SpO2 98 %. Systems examined below as of 10/28/17   General: No apparent distress alert and oriented x3 mood and affect normal, dressed appropriately.  HEENT: Pupils equal, extraocular movements intact  Respiratory: Patient's speak in full sentences and does not appear short of breath  Cardiovascular: No lower extremity edema, non tender, no erythema  Skin: Warm dry intact with no signs of infection or rash on extremities or on axial skeleton.  Abdomen: Soft nontender  Neuro: Cranial nerves II through XII are intact, neurovascularly intact in all extremities with 2+ DTRs and 2+ pulses.  Lymph: No lymphadenopathy of posterior or anterior cervical chain or axillae bilaterally.  Gait normal with good balance and coordination.  MSK:  Non tender with full range of motion and good stability and symmetric strength and tone of shoulders, elbows, wrist, hip, knee and ankles bilaterally.  Neck: Inspection mild loss of lordosis. No palpable stepoffs. Negative Spurling's maneuver.  Loss less 5 degrees of extension as well  as right-sided rotation. Grip strength and sensation normal in bilateral hands Strength good C4 to T1 distribution No sensory change to C4 to T1 Negative Hoffman sign bilaterally Reflexes normal Tightness of the right trapezius.  Osteopathic findings C2 flexed rotated and side bent right C4 flexed rotated and side bent left C7 flexed rotated and side bent left T3 extended rotated and side bent right inhaled third rib T9 extended rotated and side bent left L3 flexed  rotated and side bent right Sacrum right on right     Impression and Recommendations:     This case required medical decision making of moderate complexity.      Note: This dictation was prepared with Dragon dictation along with smaller phrase technology. Any transcriptional errors that result from this process are unintentional.

## 2017-10-28 ENCOUNTER — Encounter: Payer: Self-pay | Admitting: Family Medicine

## 2017-10-28 ENCOUNTER — Ambulatory Visit (INDEPENDENT_AMBULATORY_CARE_PROVIDER_SITE_OTHER): Admitting: Family Medicine

## 2017-10-28 VITALS — BP 132/88 | HR 83 | Ht 67.0 in | Wt 173.0 lb

## 2017-10-28 DIAGNOSIS — G43009 Migraine without aura, not intractable, without status migrainosus: Secondary | ICD-10-CM | POA: Diagnosis not present

## 2017-10-28 DIAGNOSIS — M999 Biomechanical lesion, unspecified: Secondary | ICD-10-CM

## 2017-10-28 NOTE — Patient Instructions (Signed)
You are doing great  Keep it up  4 weeks

## 2017-10-28 NOTE — Assessment & Plan Note (Signed)
Cervicogenic headache.  Patient has responded fairly well to osteopathic manipulation.  Continue the same regimen at this time.  Discussed posture and ergonomics.  We once again discussed the potential for the Effexor which patient declined taking.  Follow-up again in 4 weeks

## 2017-10-28 NOTE — Assessment & Plan Note (Signed)
Decision today to treat with OMT was based on Physical Exam  After verbal consent patient was treated with HVLA, ME, FPR techniques in cervical, thoracic, lumbar and sacral areas  Patient tolerated the procedure well with improvement in symptoms  Patient given exercises, stretches and lifestyle modifications  See medications in patient instructions if given  Patient will follow up in 4 weeks 

## 2017-11-07 ENCOUNTER — Encounter: Payer: Self-pay | Admitting: Internal Medicine

## 2017-11-11 ENCOUNTER — Encounter: Payer: Self-pay | Admitting: Cardiovascular Disease

## 2017-11-11 ENCOUNTER — Encounter: Payer: Self-pay | Admitting: Internal Medicine

## 2017-11-11 ENCOUNTER — Ambulatory Visit (INDEPENDENT_AMBULATORY_CARE_PROVIDER_SITE_OTHER): Admitting: Cardiovascular Disease

## 2017-11-11 VITALS — BP 160/100 | HR 72 | Ht 67.0 in | Wt 169.6 lb

## 2017-11-11 DIAGNOSIS — R002 Palpitations: Secondary | ICD-10-CM | POA: Diagnosis not present

## 2017-11-11 DIAGNOSIS — R030 Elevated blood-pressure reading, without diagnosis of hypertension: Secondary | ICD-10-CM | POA: Diagnosis not present

## 2017-11-11 NOTE — Assessment & Plan Note (Signed)
Fleet ContrasRachel was referred to me by Dr. Lawerance BachBurns for elevated blood pressure. She has been on labetalol in the past and currently is on long-acting propranolol. She did [place] ago apparently normal and blood work by Dr. Lawerance BachBurns. She says that her blood pressure at home runs in the 120/70 range when she takes her medications since but usually is elevated in doctor's offices. Her blood pressure also was elevated during times when she's having migraine headaches. She's been counseled about salt restriction.

## 2017-11-11 NOTE — Progress Notes (Signed)
11/11/2017 Margaret Deleon   September 16, 1981  [id number]  Primary Physician Pincus SanesBurns, Margaret Deleon Primary Cardiologist: Runell GessJonathan J Grettel Rames Deleon Margaret CalamityFACP, FACC, FAHA, [place]  HPI:  Margaret Deleon is a 37 y.o. fit-appearing married Caucasian female mother of 3 children who works as a Psychologist, educationalultrasound tech at [employer]. She was referred by Dr. Cheryll CockayneStacy Burns for cardiac evaluation because of hypertension and palpitations. She says she's had high blood pressures over the last several months although has been on labetalol in the past. She also has had palpitations in the past and has worn a Holter monitor. Her palpitations have improved since coming on third shift she does drink a cup of coffee a day. She has no other risk factors including no family history patient drinks socially and does not smoke. She has been on Lexapro for anxiety. She's had renal Doppler studies done in [place] to rule out renal artery stenosis which were apparently negative and a normal 2-D echo in the past as well.    Current Meds  Medication Sig  . butalbital-acetaminophen-caffeine (FIORICET, ESGIC) 50-325-40 MG tablet Take 1-2 tablets by mouth every 6 (six) hours as needed for headache.  . cyclobenzaprine (FLEXERIL) 10 MG tablet Take 0.5-1 tablets (5-10 mg total) by mouth at bedtime.  . gabapentin (NEURONTIN) 100 MG capsule Take 2 capsules (200 mg total) by mouth at bedtime.  [name] Kitchen. LORazepam (ATIVAN) 0.5 MG tablet Take 0.5 mg by mouth daily as needed for anxiety.  . prochlorperazine (COMPAZINE) 10 MG tablet Take 1 tablet (10 mg total) by mouth every 8 (eight) hours as needed for refractory nausea / vomiting (refractory headache).  . propranolol ER (INDERAL LA) 60 MG 24 hr capsule Take 1 capsule (60 mg total) by mouth daily.  . Vitamin D, Ergocalciferol, (DRISDOL) 50000 units CAPS capsule Take 1 capsule (50,000 Units total) by mouth every 7 (seven) days.     Allergies  Allergen Reactions  . Codeine Nausea And Vomiting   . Topamax [Topiramate]     Felt weird, high like and tired    Social History   Socioeconomic History  . Marital status: Married    Spouse name: Not on file  . Number of children: 3  . Years of education: Not on file  . Highest education level: Not on file  Social Needs  . Financial resource strain: Not on file  . Food insecurity - worry: Not on file  . Food insecurity - inability: Not on file  . Transportation needs - medical: Not on file  . Transportation needs - non-medical: Not on file  Occupational History  . Not on file  Tobacco Use  . Smoking status: Never Smoker  . Smokeless tobacco: Never Used  Substance and Sexual Activity  . Alcohol use: Yes  . Drug use: No  . Sexual activity: Yes  Other Topics Concern  . Not on file  Social History Narrative  . Not on file     Review of Systems: General: negative for chills, fever, night sweats or weight changes.  Cardiovascular: negative for chest pain, dyspnea on exertion, edema, orthopnea, palpitations, paroxysmal nocturnal dyspnea or shortness of breath Dermatological: negative for rash Respiratory: negative for cough or wheezing Urologic: negative for hematuria Abdominal: negative for nausea, vomiting, diarrhea, bright red blood per rectum, melena, or hematemesis Neurologic: negative for visual changes, syncope, or dizziness All other systems reviewed and are otherwise negative except as noted above.    Blood pressure (!) 160/100, pulse 72, height  5\' 7"  (1.702 m), weight 169 lb 9.6 oz (76.9 kg).  General appearance: alert and no distress Neck: no adenopathy, no carotid bruit, no JVD, supple, symmetrical, trachea midline and thyroid not enlarged, symmetric, no tenderness/mass/nodules Lungs: clear to auscultation bilaterally Heart: regular rate and rhythm, S1, S2 normal, no murmur, click, rub or gallop Extremities: extremities normal, atraumatic, no cyanosis or edema Pulses: 2+ and symmetric Skin: Skin color,  texture, turgor normal. No rashes or lesions Neurologic: Alert and oriented X 3, normal strength and tone. Normal symmetric reflexes. Normal coordination and gait  EKG sinus rhythm at 72 with ST and T-wave changes. I personally reviewed this EKG  ASSESSMENT AND PLAN:   Elevated blood pressure reading Margaret Deleon was referred to me by Dr. Lawerance Bach for elevated blood pressure. She has been on labetalol in the past and currently is on long-acting propranolol. She did have renal Dopplers performed in [place] over a year ago apparently normal and blood work by Dr. Lawerance Bach. She says that her blood pressure at home runs in the 120/70 range when she takes her medications since but usually is elevated in doctor's offices. Her blood pressure also was elevated during times when she's having migraine headaches. She's been counseled about salt restriction.  Palpitations Margaret Deleon was September by Dr. Lawerance Bach progress to palpitations. She has had a Holter monitor in the past. She drinks a cup of coffee a day. Since coming on third shift she said her palpitations have improved. He had blood work performed by Dr. Lawerance Bach recently. She's had a normal 2-D echo in the past. My plan was to see her back in 3 months and if she still having palpitations yesterday event monitor. Patient was very anxious my office and actually left tearful.      Runell Gess Deleon FACP,FACC,FAHA, [facility] 11/11/2017 2:35 PM

## 2017-11-11 NOTE — Assessment & Plan Note (Signed)
Fleet ContrasRachel was September by Dr. Lawerance BachBurns progress to palpitations. She has had a Holter monitor in the past. She drinks a cup of coffee a day. Since coming on third shift she said her palpitations have improved. He had blood work performed by Dr. Lawerance BachBurns recently. She's had a normal 2-D echo in the past. My plan was to see her back in 3 months and if she still having palpitations yesterday event monitor. Patient was very anxious my office and actually left tearful.

## 2017-11-11 NOTE — Patient Instructions (Signed)
Medication Instructions: Your physician recommends that you continue on your current medications as directed. Please refer to the Current Medication list given to you today.   Follow-Up: Your physician recommends that you schedule a follow-up appointment in: 3 months with Dr. Berry.     

## 2017-11-14 MED ORDER — SERTRALINE HCL 50 MG PO TABS: 50.0000 mg | ORAL_TABLET | Freq: Every day | ORAL | 3 refills | Status: DC

## 2017-11-14 NOTE — Addendum Note (Signed)
Addended by: Pincus SanesBURNS, Laiyah Exline J on: 11/14/2017 12:38 PM   Modules accepted: Orders

## 2017-11-18 ENCOUNTER — Ambulatory Visit: Attending: Internal Medicine | Admitting: Physical Therapy

## 2017-11-18 ENCOUNTER — Other Ambulatory Visit: Payer: Self-pay

## 2017-11-18 DIAGNOSIS — R29898 Other symptoms and signs involving the musculoskeletal system: Secondary | ICD-10-CM

## 2017-11-18 DIAGNOSIS — R293 Abnormal posture: Secondary | ICD-10-CM | POA: Insufficient documentation

## 2017-11-18 DIAGNOSIS — M542 Cervicalgia: Secondary | ICD-10-CM | POA: Insufficient documentation

## 2017-11-18 NOTE — Patient Instructions (Addendum)
Axial Extension (Chin Tuck)   Pull chin in and lengthen back of neck. Hold __30__ seconds while counting out loud. Repeat __10-15__ times. Do __2-3__ sessions per day.  Flexibility: Upper Trapezius Stretch   Gently grasp right side of head while reaching behind back with other hand. Tilt head away until a gentle stretch is felt. Hold __30_ seconds. Repeat __3__ times per set. Do __2-3__ sessions per day.  Levator Scapula Stretch, Sitting   Sit, one hand tucked under hip on side to be stretched, other hand over top of head. Turn head toward other side and look down. Use hand on head to gently stretch neck in that position. Hold _30_ seconds. Repeat _3__ times per session. Do _2-3__ sessions per day.  Scapular Retraction (Standing)   With arms at sides, pinch shoulder blades together. Repeat __10-15__ times per set. Do __2-3__ sessions per day.   Trigger Point Dry Needling  . What is Trigger Point Dry Needling (DN)? o DN is a physical therapy technique used to treat muscle pain and dysfunction. Specifically, DN helps deactivate muscle trigger points (muscle knots).  o A thin filiform needle is used to penetrate the skin and stimulate the underlying trigger point. The goal is for a local twitch response (LTR) to occur and for the trigger point to relax. No medication of any kind is injected during the procedure.   . What Does Trigger Point Dry Needling Feel Like?  o The procedure feels different for each individual patient. Some patients report that they do not actually feel the needle enter the skin and overall the process is not painful. Very mild bleeding may occur. However, many patients feel a deep cramping in the muscle in which the needle was inserted. This is the local twitch response.   [name] Kitchen. How Will I feel after the treatment? o Soreness is normal, and the onset of soreness may not occur for a few hours. Typically this soreness does not last longer than two days.  o Bruising  is uncommon, however; ice can be used to decrease any possible bruising.  o In rare cases feeling tired or nauseous after the treatment is normal. In addition, your symptoms may get worse before they get better, this period will typically not last longer than 24 hours.   . What Can I do After My Treatment? o Increase your hydration by drinking more water for the next 24 hours. o You may place ice or heat on the areas treated that have become sore, however, do not use heat on inflamed or bruised areas. Heat often brings more relief post needling. o You can continue your regular activities, but vigorous activity is not recommended initially after the treatment for 24 hours. o DN is best combined with other physical therapy such as strengthening, stretching, and other therapies.

## 2017-11-19 ENCOUNTER — Encounter: Payer: Self-pay | Admitting: Physical Therapy

## 2017-11-19 NOTE — Therapy (Signed)
[facility] [address]  [address] [place], [place], [address] Phone: [phone]   Fax:  [phone]  Physical Therapy Treatment  Patient Details  Name: Margaret Deleon MRN: [id number] Date of Birth: Sep 22, 1980 Referring Provider: Dr. Cheryll Cockayne   Encounter Date: 11/18/2017  PT End of Session - 11/19/17 0756    Visit Number  1    Number of Visits  12    Date for PT Re-Evaluation  12/31/17    Authorization Type  [employer]    PT Start Time  1447    PT Stop Time  1533    PT Time Calculation (min)  46 min    Activity Tolerance  Patient tolerated treatment well    Behavior During Therapy  [facility] for tasks assessed/performed       Past Medical History:  Diagnosis Date  . Depression   . Hypertension   . Migraines     History reviewed. No pertinent surgical history.  There were no vitals filed for this visit.  Subjective Assessment - 11/18/17 1448    Subjective  reports muscel tightness and tension headache. 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 Also reports tightness in B LE - is a runner. Pain feels muscular in nature. Has had migraines in the past. 1 migraine a week - has improved. Has found improvements with manipulations. Pain flared up with busy work days. Tries to stretch daily with little relief.     Pertinent History  cluster migraine, intermittent high BP, palpitations    Diagnostic tests  none recently    Patient Stated Goals  improve pain/tightness    Currently in Pain?  Yes    Pain Score  2     Pain Location  Neck tops of shoulders to midback    Pain Orientation  Right;Left;Posterior    Pain Descriptors / Indicators  Sharp;Shooting    Pain Type  Acute pain November 2018    Pain Onset  More than a month ago    Pain Frequency  Constant    Aggravating Factors   long/busy work days         [facility] PT Assessment - 11/18/17 1455      Assessment   Medical Diagnosis  Muscle tightness     Referring Provider  Dr. Cheryll Cockayne    Onset Date/Surgical Date  -- November 2018    Next MD Visit  -- few weeks    Prior Therapy  no      Precautions   Precautions  None      Restrictions   Weight Bearing Restrictions  No      Balance Screen   Has the patient fallen in the past 6 months  No    Has the patient had a decrease in activity level because of a fear of falling?   No    Is the patient reluctant to leave their home because of a fear of falling?   No      Home Public house manager residence      Prior Function   Level of Independence  Independent    Vocation  Full time employment    Vocation Requirements  ultrasound tech    Leisure  running      Cognition   Overall Cognitive Status  Within Functional Limits for tasks assessed      Sensation   Light Touch  Appears Intact  Coordination   Gross Motor Movements are Fluid and Coordinated  Yes      Posture/Postural Control   Posture/Postural Control  Postural limitations    Postural Limitations  Rounded Shoulders;Forward head      ROM / Strength   AROM / PROM / Strength  AROM;Strength      AROM   AROM Assessment Site  Cervical    Cervical Flexion  WFL - tightness/stretching    Cervical Extension  WFL - tightness in back of neck    Cervical - Right Side Bend  25% limited - painful on L side    Cervical - Left Side Bend  25% limited - painful on L side    Cervical - Right Rotation  WFL - stretch of L side    Cervical - Left Rotation  WFL - stretch on L side      Strength   Overall Strength Comments  B UE grossly 4/5 to 4+/5 - pain reproduction at neck and upper back with all MMT    Strength Assessment Site  Shoulder    Right/Left Shoulder  Right;Left      Palpation   Spinal mobility  C-spine with segmental hypomobility + pain production    Palpation comment  TTP at B UT, B LS, B cervical paraspinals, B suboccipital mm.                  [facility] Adult PT Treatment/Exercise -  11/19/17 0001      Exercises   Exercises  Neck      Neck Exercises: Seated   Neck Retraction  5 reps;5 secs    Other Seated Exercise  scap retraction - 5 x 5 sec      Neck Exercises: Stretches   Upper Trapezius Stretch  Right;Left;2 reps;30 seconds    Levator Stretch  Right;Left;2 reps;30 seconds             PT Education - 11/18/17 1526    Education provided  Yes    Education Details  exam findings, POC, HEP    Person(s) Educated  Patient    Methods  Explanation;Demonstration;Handout    Comprehension  Verbalized understanding;Returned demonstration          PT Long Term Goals - 11/19/17 1405      PT LONG TERM GOAL #1   Title  patient to be independent with advanced HEP    Status  New    Target Date  12/31/17      PT LONG TERM GOAL #2   Title  patient to demonstrate cervical AROM to WNL in all planes without pain production    Status  New    Target Date  12/31/17      PT LONG TERM GOAL #3   Title  patient to report reduction in headache intensity, frequency, and duration by >/= 50%    Status  New    Target Date  12/31/17      PT LONG TERM GOAL #4   Title  patient to report ability to perform work and household activities without increase in neck pain/tightness    Status  New    Target Date  12/31/17            Plan - 11/19/17 0757    Clinical Impression Statement  Patient is a 37 y/o female presenting to OPPT today regarding primary complaints of muscular tightness and headaches that interfere with daily activities. patient with TTP along neck and upper back musculature  as well as segmental hypomobility throughout C-spine likely exacerbated by continued guarding in this area. Patient today given initial HEP for gentle stretching and strengthening with good tolerance and carryover. Discussion of dry needling today with patient seemingly receptive to this service. Patient to benefit from skilled PT ot address pain and functional limitations due to pain.      Clinical Presentation  Stable    Clinical Decision Making  Low    Rehab Potential  Good    PT Frequency  2x / week    PT Duration  6 weeks    PT Treatment/Interventions  ADLs/Self Care Home Management;Cryotherapy;Electrical Stimulation;Moist Heat;Traction;Therapeutic exercise;Therapeutic activities;Functional mobility training;Ultrasound;Neuromuscular re-education;Patient/family education;Manual techniques;Vasopneumatic Device;Taping;Dry needling;Passive range of motion    Consulted and Agree with Plan of Care  Patient       Patient will benefit from skilled therapeutic intervention in order to improve the following deficits and impairments:  Pain, Impaired UE functional use, Decreased strength, Decreased activity tolerance  Visit Diagnosis: Cervicalgia  Abnormal posture  Other symptoms and signs involving the musculoskeletal system     Problem List Patient Active Problem List   Diagnosis Date Noted  . Elevated blood pressure reading 10/14/2017  . Nonallopathic lesion of cervical region 10/06/2017  . Nonallopathic lesion of thoracic region 10/06/2017  . Nonallopathic lesion of lumbosacral region 10/06/2017  . Migraine headache 09/17/2017  . Anxiety 09/17/2017  . Neck pain 09/17/2017  . Muscle tightness 09/17/2017  . Palpitations 09/17/2017     Kipp Laurence, PT, DPT 11/19/17 2:08 PM   [facility] [address]  [address] [place], [place], [address] Phone: [phone]   Fax:  [phone]  Name: Margaret Deleon MRN: [id number] Date of Birth: 1981-02-10

## 2017-11-26 ENCOUNTER — Encounter: Payer: Self-pay | Admitting: Physical Therapy

## 2017-11-26 ENCOUNTER — Ambulatory Visit: Admitting: Physical Therapy

## 2017-11-26 DIAGNOSIS — R29898 Other symptoms and signs involving the musculoskeletal system: Secondary | ICD-10-CM

## 2017-11-26 DIAGNOSIS — M542 Cervicalgia: Secondary | ICD-10-CM | POA: Diagnosis not present

## 2017-11-26 DIAGNOSIS — R293 Abnormal posture: Secondary | ICD-10-CM

## 2017-11-26 NOTE — Progress Notes (Signed)
[name] [address] [place], [place] [address] Phone: [phone] Subjective:      CC: Neck pain follow-up  Margaret Deleon  Vercie Pokorny is a 37 y.o. female coming in with complaint of neck pain, migraine headaches, polymyalgia.  Found to have low vitamin D and started once weekly vitamin D.  We also started osteopathic manipulation.  Patient was to start home exercises and icing regimen.  Patient states start gabapentin low-dose at night.  Patient states overall she thinks she is improving.  Not as many headaches.       Past Medical History:  Diagnosis Date  . Depression   . Hypertension   . Migraines    No past surgical history on file. Social History   Socioeconomic History  . Marital status: Married    Spouse name: None  . Number of children: 3  . Years of education: None  . Highest education level: None  Social Needs  . Financial resource strain: None  . Food insecurity - worry: None  . Food insecurity - inability: None  . Transportation needs - medical: None  . Transportation needs - non-medical: None  Occupational History  . None  Tobacco Use  . Smoking status: Never Smoker  . Smokeless tobacco: Never Used  Substance and Sexual Activity  . Alcohol use: Yes  . Drug use: No  . Sexual activity: Yes  Other Topics Concern  . None  Social History Narrative  . None   Allergies  Allergen Reactions  . Codeine Nausea And Vomiting  . Topamax [Topiramate]     Felt weird, high like and tired   Family History  Problem Relation Age of Onset  . Depression Mother   . Cancer Father   . Alcohol abuse Brother   . Depression Brother   . Migraines Sister      Past medical history, social, surgical and family history all reviewed in electronic medical record.  No pertanent information unless stated regarding to the chief complaint.   Review of Systems:Review of systems updated and as accurate as of 11/27/17  No headache,  visual changes, nausea, vomiting, diarrhea, constipation, dizziness, abdominal pain, skin rash, fevers, chills, night sweats, weight loss, swollen lymph nodes, body aches, joint swelling, muscle aches, chest pain, shortness of breath, mood changes.   Objective  Blood pressure 112/80, pulse 81, height 5\' 7"  (1.702 m), weight 170 lb (77.1 kg), SpO2 97 %. Systems examined below as of 11/27/17   General: No apparent distress alert and oriented x3 mood and affect normal, dressed appropriately.  HEENT: Pupils equal, extraocular movements intact  Respiratory: Patient's speak in full sentences and does not appear short of breath  Cardiovascular: No lower extremity edema, non tender, no erythema  Skin: Warm dry intact with no signs of infection or rash on extremities or on axial skeleton.  Abdomen: Soft nontender  Neuro: Cranial nerves II through XII are intact, neurovascularly intact in all extremities with 2+ DTRs and 2+ pulses.  Lymph: No lymphadenopathy of posterior or anterior cervical chain or axillae bilaterally.  Gait normal with good balance and coordination.  MSK:  Non tender with full range of motion and good stability and symmetric strength and tone of shoulders, elbows, wrist, hip, knee and ankles bilaterally.   Neck: Inspection unremarkable. No palpable stepoffs. Negative Spurling's maneuver. Mild decrease in right-sided rotation left-sided side bending Grip strength and sensation normal in bilateral hands Strength good C4 to T1 distribution No sensory change to  C4 to T1 Negative Hoffman sign bilaterally Reflexes normal Tightness of the right trapezius  Osteopathic findings C4 flexed rotated and side bent left C6 flexed rotated and side bent right  T3 extended rotated and side bent right inhaled third rib T5 extended rotated and side bent left L4 flexed rotated and side bent left Sacrum right on right     Impression and Recommendations:     This case required medical  decision making of moderate complexity.      Note: This dictation was prepared with Dragon dictation along with smaller phrase technology. Any transcriptional errors that result from this process are unintentional.

## 2017-11-26 NOTE — Therapy (Signed)
[facility] [place] [address]  [address] [place], [place], [address] Phone: [phone]   Fax:  [phone]  Physical Therapy Treatment  Patient Details  Name: Margaret Deleon MRN: [id number] Date of Birth: 04/13/1981 Referring Provider: Dr. Cheryll CockayneStacy Burns   Encounter Date: 11/26/2017  PT End of Session - 11/26/17 1404    Visit Number  2    Number of Visits  12    Date for PT Re-Evaluation  12/31/17    Authorization Type  [employer]    PT Start Time  1401    PT Stop Time  1502    PT Time Calculation (min)  61 min    Activity Tolerance  Patient tolerated treatment well    Behavior During Therapy  [facility] for tasks assessed/performed       Past Medical History:  Diagnosis Date  . Depression   . Hypertension   . Migraines     History reviewed. No pertinent surgical history.  There were no vitals filed for this visit.  Subjective Assessment - 11/26/17 1403    Subjective  some soreness today - worked out total body yesterday; has been doing HEP with no issues.    Pertinent History  cluster migraine, intermittent high BP, palpitations    Diagnostic tests  none recently    Patient Stated Goals  improve pain/tightness    Currently in Pain?  Yes    Pain Score  3     Pain Location  Neck and upper back/shoulder    Pain Orientation  Right;Left;Posterior    Pain Descriptors / Indicators  Aching    Pain Type  Acute pain                      [facility] Adult PT Treatment/Exercise - 11/26/17 1405      Neck Exercises: Machines for Strengthening   UBE (Upper Arm Bike)  L2 x 6 min (3/3)      Neck Exercises: Theraband   Shoulder External Rotation  10 reps;Red    Horizontal ABduction  10 reps;Red      Modalities   Modalities  Electrical Stimulation;Moist Heat      Moist Heat Therapy   Number Minutes Moist Heat  15 Minutes    Moist Heat Location  Cervical;Shoulder      Electrical Stimulation   Electrical Stimulation  Location  B UT to shoulder    Electrical Stimulation Action  IFC    Electrical Stimulation Parameters  to tolerance    Electrical Stimulation Goals  Pain;Tone      Manual Therapy   Manual Therapy  Soft tissue mobilization;Joint mobilization;Myofascial release    Manual therapy comments  patient prone    Joint Mobilization  grade II-III CPAs of lower cervical and upper thoracic spine    Soft tissue mobilization  STM to B UT, B LS, B infra, B cervical paraspinals    Myofascial Release  manual trigger point release to B UT, B LS       Trigger Point Dry Needling - 11/26/17 1437    Consent Given?  Yes    Education Handout Provided  Yes    Muscles Treated Upper Body  Upper trapezius bilateral    Upper Trapezius Response  Twitch reponse elicited;Palpable increased muscle length                PT Long Term Goals - 11/26/17 1404      PT LONG TERM GOAL #1  Title  patient to be independent with advanced HEP    Status  On-going      PT LONG TERM GOAL #2   Title  patient to demonstrate cervical AROM to WNL in all planes without pain production    Status  On-going      PT LONG TERM GOAL #3   Title  patient to report reduction in headache intensity, frequency, and duration by >/= 50%    Status  On-going      PT LONG TERM GOAL #4   Title  patient to report ability to perform work and household activities without increase in neck pain/tightness    Status  On-going            Plan - 11/26/17 1404    Clinical Impression Statement  Margaret Deleon doing well today - good relief with HEP with verbalized ability to perform daily with no issue. Subjective reports of lsight increased pain due to exercising yesterday. Wishing to proceed with trial of DN today with good understanding of process along with benefits vs risk. Good twitch response noted, with only soreness at end of session. Updated HEP to include more scapular stabilization strengthening with good tolerance. Will continue to  progress towards goals.     PT Treatment/Interventions  ADLs/Self Care Home Management;Cryotherapy;Electrical Stimulation;Moist Heat;Traction;Therapeutic exercise;Therapeutic activities;Functional mobility training;Ultrasound;Neuromuscular re-education;Patient/family education;Manual techniques;Vasopneumatic Device;Taping;Dry needling;Passive range of motion    Consulted and Agree with Plan of Care  Patient       Patient will benefit from skilled therapeutic intervention in order to improve the following deficits and impairments:  Pain, Impaired UE functional use, Decreased strength, Decreased activity tolerance  Visit Diagnosis: Cervicalgia  Abnormal posture  Other symptoms and signs involving the musculoskeletal system     Problem List Patient Active Problem List   Diagnosis Date Noted  . Elevated blood pressure reading 10/14/2017  . Nonallopathic lesion of cervical region 10/06/2017  . Nonallopathic lesion of thoracic region 10/06/2017  . Nonallopathic lesion of lumbosacral region 10/06/2017  . Migraine headache 09/17/2017  . Anxiety 09/17/2017  . Neck pain 09/17/2017  . Muscle tightness 09/17/2017  . Palpitations 09/17/2017     Kipp Laurence, PT, DPT 11/26/17 3:30 PM   [facility] [address]  [address] [place], [place], [address] Phone: [phone]   Fax:  [phone]  Name: Margaret Deleon MRN: [id number] Date of Birth: 1981/08/14

## 2017-11-27 ENCOUNTER — Encounter: Payer: Self-pay | Admitting: Family Medicine

## 2017-11-27 ENCOUNTER — Ambulatory Visit (INDEPENDENT_AMBULATORY_CARE_PROVIDER_SITE_OTHER): Admitting: Family Medicine

## 2017-11-27 VITALS — BP 112/80 | HR 81 | Ht 67.0 in | Wt 170.0 lb

## 2017-11-27 DIAGNOSIS — M999 Biomechanical lesion, unspecified: Secondary | ICD-10-CM | POA: Diagnosis not present

## 2017-11-27 DIAGNOSIS — M542 Cervicalgia: Secondary | ICD-10-CM | POA: Diagnosis not present

## 2017-11-27 NOTE — Assessment & Plan Note (Signed)
Decision today to treat with OMT was based on Physical Exam  After verbal consent patient was treated with HVLA, ME, FPR techniques in cervical, thoracic, lumbar and sacral areas  Patient tolerated the procedure well with improvement in symptoms  Patient given exercises, stretches and lifestyle modifications  See medications in patient instructions if given  Patient will follow up in 4-6 weeks 

## 2017-11-27 NOTE — Patient Instructions (Signed)
5 weeks

## 2017-11-27 NOTE — Assessment & Plan Note (Signed)
Patient has been doing remarkably better at this time.  We discussed icing regimen and home exercises.  Discussed which activities to do which wants to avoid.  Patient is to increase activity slowly.  Patient will follow-up with me again in 4-6 weeks.

## 2017-12-01 ENCOUNTER — Encounter: Payer: Self-pay | Admitting: Physical Therapy

## 2017-12-01 ENCOUNTER — Ambulatory Visit: Admitting: Physical Therapy

## 2017-12-01 DIAGNOSIS — R293 Abnormal posture: Secondary | ICD-10-CM

## 2017-12-01 DIAGNOSIS — R29898 Other symptoms and signs involving the musculoskeletal system: Secondary | ICD-10-CM

## 2017-12-01 DIAGNOSIS — M542 Cervicalgia: Secondary | ICD-10-CM | POA: Diagnosis not present

## 2017-12-01 NOTE — Therapy (Signed)
[facility] [place] [address]  [address] [place], [place], [address] Phone: [phone]   Fax:  [phone]  Physical Therapy Treatment  Patient Details  Name: Margaret Deleon MRN: [id number] Date of Birth: 1981-02-05 Referring Provider: Dr. Cheryll CockayneStacy Burns   Encounter Date: 12/01/2017  PT End of Session - 12/01/17 1400    Visit Number  3    Number of Visits  12    Date for PT Re-Evaluation  12/31/17    Authorization Type  [employer]    PT Start Time  1400    PT Stop Time  1502    PT Time Calculation (min)  62 min    Activity Tolerance  Patient tolerated treatment well    Behavior During Therapy  [facility] for tasks assessed/performed       Past Medical History:  Diagnosis Date  . Depression   . Hypertension   . Migraines     History reviewed. No pertinent surgical history.  There were no vitals filed for this visit.  Subjective Assessment - 12/01/17 1404    Subjective  Pt noting expeceted soreness following DN last visit, but also reports increased overall soreness & tension headaches for the last few days.    Pertinent History  cluster migraine, intermittent high BP, palpitations    Diagnostic tests  none recently    Patient Stated Goals  improve pain/tightness    Currently in Pain?  Yes    Pain Score  3     Pain Location  Neck & upper back/B shoulders    Pain Orientation  Right;Left;Posterior    Pain Descriptors / Indicators  Aching    Pain Type  Acute pain    Pain Frequency  Constant                      [facility] Adult PT Treatment/Exercise - 12/01/17 1400      Exercises   Exercises  Neck      Neck Exercises: Machines for Strengthening   UBE (Upper Arm Bike)  L2 x 6 min (3/3)      Neck Exercises: Theraband   Shoulder External Rotation  15 reps;Red    Horizontal ABduction  15 reps;Red      Modalities   Modalities  Traction      Traction   Type of Traction  Cervical 10 dg flexion    Min (lbs)  8     Max (lbs)  16    Hold Time  60    Rest Time  20    Time  12      Manual Therapy   Manual Therapy  Soft tissue mobilization;Myofascial release;Manual Traction    Manual therapy comments  patient prone & supine    Soft tissue mobilization  STM to B UT, B LS, B cervical paraspinals    Myofascial Release  manual suboccipital release, manual TPR to B UT, B LS    Manual Traction  manual traction/distraction 4x30"       Trigger Point Dry Needling - 12/01/17 1400    Consent Given?  Yes    Muscles Treated Upper Body  Levator scapulae;Suboccipitals muscle group splenius capitus & cervcis    SubOccipitals Response  Twitch response elicited;Palpable increased muscle length    Levator Scapulae Response  Twitch response elicited;Palpable increased muscle length                PT Long Term Goals - 11/26/17 1404  PT LONG TERM GOAL #1   Title  patient to be independent with advanced HEP    Status  On-going      PT LONG TERM GOAL #2   Title  patient to demonstrate cervical AROM to WNL in all planes without pain production    Status  On-going      PT LONG TERM GOAL #3   Title  patient to report reduction in headache intensity, frequency, and duration by >/= 50%    Status  On-going      PT LONG TERM GOAL #4   Title  patient to report ability to perform work and household activities without increase in neck pain/tightness    Status  On-going            Plan - 12/01/17 1408    Clinical Impression Statement  Pt reporting expected soreness the day following DN last session, but also reporting increased pain/tension including tension headache for the last few days. Pt open to trying further DN and manual therapy today in hopes of aleviating headache, with some improvement noted but still noting headache following manual therapy/DN. Positive response noted with maual traction, therefore visit completed with mechanical traction.    PT Treatment/Interventions  ADLs/Self Care Home  Management;Cryotherapy;Electrical Stimulation;Moist Heat;Traction;Therapeutic exercise;Therapeutic activities;Functional mobility training;Ultrasound;Neuromuscular re-education;Patient/family education;Manual techniques;Vasopneumatic Device;Taping;Dry needling;Passive range of motion    Consulted and Agree with Plan of Care  Patient       Patient will benefit from skilled therapeutic intervention in order to improve the following deficits and impairments:  Pain, Impaired UE functional use, Decreased strength, Decreased activity tolerance  Visit Diagnosis: Cervicalgia  Abnormal posture  Other symptoms and signs involving the musculoskeletal system     Problem List Patient Active Problem List   Diagnosis Date Noted  . Elevated blood pressure reading 10/14/2017  . Nonallopathic lesion of cervical region 10/06/2017  . Nonallopathic lesion of thoracic region 10/06/2017  . Nonallopathic lesion of lumbosacral region 10/06/2017  . Migraine headache 09/17/2017  . Anxiety 09/17/2017  . Neck pain 09/17/2017  . Muscle tightness 09/17/2017  . Palpitations 09/17/2017    Marry Guan, PT, MPT 12/01/2017, 3:12 PM  [facility] [address]  [address] [place], [place], [address] Phone: [phone]   Fax:  [phone]  Name: Margaret Deleon MRN: [id number] Date of Birth: May 25, 1981

## 2017-12-04 ENCOUNTER — Ambulatory Visit: Admitting: Physical Therapy

## 2017-12-04 ENCOUNTER — Encounter: Payer: Self-pay | Admitting: Physical Therapy

## 2017-12-04 DIAGNOSIS — M542 Cervicalgia: Secondary | ICD-10-CM | POA: Diagnosis not present

## 2017-12-04 DIAGNOSIS — R29898 Other symptoms and signs involving the musculoskeletal system: Secondary | ICD-10-CM

## 2017-12-04 DIAGNOSIS — R293 Abnormal posture: Secondary | ICD-10-CM

## 2017-12-04 NOTE — Therapy (Signed)
[facility] [place] [address]  [address] [place], [place], [address] Phone: [phone]   Fax:  [phone]  Physical Therapy Treatment  Patient Details  Name: Margaret Deleon MRN: [id number] Date of Birth: June 23, 1981 Referring Provider: Dr. Cheryll CockayneStacy Burns   Encounter Date: 12/04/2017  PT End of Session - 12/04/17 1404    Visit Number  4    Number of Visits  12    Date for PT Re-Evaluation  12/31/17    Authorization Type  [employer]    PT Start Time  1401    PT Stop Time  1500    PT Time Calculation (min)  59 min    Activity Tolerance  Patient tolerated treatment well    Behavior During Therapy  [facility] for tasks assessed/performed       Past Medical History:  Diagnosis Date  . Depression   . Hypertension   . Migraines     History reviewed. No pertinent surgical history.  There were no vitals filed for this visit.  Subjective Assessment - 12/04/17 1403    Subjective  does not want DN to neck/head again with headache - developed into an even more intense headache    Pertinent History  cluster migraine, intermittent high BP, palpitations    Diagnostic tests  none recently    Patient Stated Goals  improve pain/tightness    Currently in Pain?  Yes    Pain Score  2     Pain Location  Neck    Pain Orientation  Right;Left;Posterior    Pain Descriptors / Indicators  Aching;Sore    Pain Type  Acute pain                      [facility] Adult PT Treatment/Exercise - 12/04/17 1404      Neck Exercises: Machines for Strengthening   Other Machines for Strengthening  recumbent bike for warm-up - L1 x 6 min      Neck Exercises: Theraband   Horizontal ABduction  15 reps;Red hooklying on pool noodle    Other Theraband Exercises  diagonal flexion - B UE - red tabnd x 15 - hooklying on pool noodle      Neck Exercises: Supine   Other Supine Exercise  serratus punch - 2 x 15 - 5# - hooklying on pool noodle      Neck Exercises:  Prone   Neck Retraction  15 reps;3 secs    Other Prone Exercise  I, T, Y over green pball x 15      Modalities   Modalities  Electrical Stimulation;Moist Heat      Moist Heat Therapy   Number Minutes Moist Heat  15 Minutes    Moist Heat Location  Cervical;Shoulder      Electrical Stimulation   Electrical Stimulation Location  B UT to shoulder    Electrical Stimulation Action  IFC    Electrical Stimulation Parameters  to tolerance    Electrical Stimulation Goals  Pain;Tone      Manual Therapy   Manual Therapy  Soft tissue mobilization;Myofascial release;Passive ROM    Manual therapy comments  patient hooklying    Soft tissue mobilization  STM to B UT, B cervical paraspinals, B suboccipital, B LS    Myofascial Release  manual trigger point release to B UT    Passive ROM  PROM in B lateral flexion for UT stretch x 1 min each  PT Long Term Goals - 11/26/17 1404      PT LONG TERM GOAL #1   Title  patient to be independent with advanced HEP    Status  On-going      PT LONG TERM GOAL #2   Title  patient to demonstrate cervical AROM to WNL in all planes without pain production    Status  On-going      PT LONG TERM GOAL #3   Title  patient to report reduction in headache intensity, frequency, and duration by >/= 50%    Status  On-going      PT LONG TERM GOAL #4   Title  patient to report ability to perform work and household activities without increase in neck pain/tightness    Status  On-going            Plan - 12/04/17 1404    Clinical Impression Statement  Patient reporting increased HA symptoms following DN last session. PT session today focusing on mid back stabilization and general shoulder/upper back strengthening wtih good tolerance. Does continue to have a good bit of muscular tightness and trigger points throughout B UT, LS and cervical paraspinals with good relief from STM and manual trigger point release. Will continue to progress  towards goals.     PT Treatment/Interventions  ADLs/Self Care Home Management;Cryotherapy;Electrical Stimulation;Moist Heat;Traction;Therapeutic exercise;Therapeutic activities;Functional mobility training;Ultrasound;Neuromuscular re-education;Patient/family education;Manual techniques;Vasopneumatic Device;Taping;Dry needling;Passive range of motion    Consulted and Agree with Plan of Care  Patient       Patient will benefit from skilled therapeutic intervention in order to improve the following deficits and impairments:  Pain, Impaired UE functional use, Decreased strength, Decreased activity tolerance  Visit Diagnosis: Cervicalgia  Abnormal posture  Other symptoms and signs involving the musculoskeletal system     Problem List Patient Active Problem List   Diagnosis Date Noted  . Elevated blood pressure reading 10/14/2017  . Nonallopathic lesion of cervical region 10/06/2017  . Nonallopathic lesion of thoracic region 10/06/2017  . Nonallopathic lesion of lumbosacral region 10/06/2017  . Migraine headache 09/17/2017  . Anxiety 09/17/2017  . Neck pain 09/17/2017  . Muscle tightness 09/17/2017  . Palpitations 09/17/2017     Kipp Laurence, PT, DPT 12/04/17 4:44 PM   [facility] [address]  [address] [place], [place], [address] Phone: [phone]   Fax:  [phone]  Name: Margaret Deleon MRN: [id number] Date of Birth: 1980/10/25

## 2017-12-08 ENCOUNTER — Ambulatory Visit: Admitting: Physical Therapy

## 2017-12-08 ENCOUNTER — Encounter: Payer: Self-pay | Admitting: Physical Therapy

## 2017-12-08 DIAGNOSIS — M542 Cervicalgia: Secondary | ICD-10-CM | POA: Diagnosis not present

## 2017-12-08 DIAGNOSIS — R29898 Other symptoms and signs involving the musculoskeletal system: Secondary | ICD-10-CM

## 2017-12-08 DIAGNOSIS — R293 Abnormal posture: Secondary | ICD-10-CM

## 2017-12-08 NOTE — Therapy (Signed)
[facility] [address]  [address] [place], [place], [address] Phone: [phone]   Fax:  [phone]  Physical Therapy Treatment  Patient Details  Name: Margaret Deleon MRN: [id number] Date of Birth: 01/02/1981 Referring Provider: Dr. Cheryll Cockayne   Encounter Date: 12/08/2017  PT End of Session - 12/08/17 1406    Visit Number  5    Number of Visits  12    Date for PT Re-Evaluation  12/31/17    Authorization Type  [employer]    PT Start Time  1406    PT Stop Time  1502    PT Time Calculation (min)  56 min    Activity Tolerance  Patient tolerated treatment well    Behavior During Therapy  [facility] for tasks assessed/performed       Past Medical History:  Diagnosis Date  . Depression   . Hypertension   . Migraines     History reviewed. No pertinent surgical history.  There were no vitals filed for this visit.  Subjective Assessment - 12/08/17 1406    Subjective  Pt reporting benefit from [facility] and estim with lasting benefit noted since last treatment. Has been consistent with HEP stretches t/o the day.    Pertinent History  cluster migraine, intermittent high BP, palpitations    Diagnostic tests  none recently    Patient Stated Goals  improve pain/tightness    Currently in Pain?  No/denies                No data recorded       [facility] Adult PT Treatment/Exercise - 12/08/17 1406      Exercises   Exercises  Neck      Neck Exercises: Machines for Strengthening   UBE (Upper Arm Bike)  L2 x 6 min (3/3)      Neck Exercises: Theraband   Horizontal ABduction  15 reps;Red hooklying on pool noodle    Other Theraband Exercises  diagonal flexion - B UE - red tabnd x 15 - hooklying on pool noodle      Neck Exercises: Standing   Wall Push Ups  10 reps    Wall Push Ups Limitations  elbows at 45 dg from sides      Neck Exercises: Supine   Other Supine Exercise  B serratus punch - 2 x 15 - 5# - hooklying on pool  noodle      Modalities   Modalities  Electrical Stimulation;Moist Heat      Moist Heat Therapy   Number Minutes Moist Heat  15 Minutes    Moist Heat Location  Cervical;Shoulder      Electrical Stimulation   Electrical Stimulation Location  B UT to shoulder    Electrical Stimulation Action  IFC    Electrical Stimulation Parameters  to tolerance x15'    Electrical Stimulation Goals  Pain;Tone      Manual Therapy   Manual Therapy  Soft tissue mobilization;Myofascial release;Passive ROM;Manual Traction    Manual therapy comments  patient hooklying on pool noodle    Soft tissue mobilization  STM to B UT, LS & cervical paraspinals; B pecs (L>>R)    Myofascial Release  manual TPR to lateral R pec major    Passive ROM  manual stretch to B UT, LS & scalenes    Manual Traction  manual traction/distraction 4x30"      Neck Exercises: Radio broadcast assistant  Stretch Limitations  3 way doorway stretch - best stretch with mid position             PT Education - 12/08/17 1430    Education provided  Yes    Education Details  Provided instruction in 3 way doorway pec stretch    Person(s) Educated  Patient    Methods  Explanation;Demonstration    Comprehension  Verbalized understanding;Returned demonstration          PT Long Term Goals - 11/26/17 1404      PT LONG TERM GOAL #1   Title  patient to be independent with advanced HEP    Status  On-going      PT LONG TERM GOAL #2   Title  patient to demonstrate cervical AROM to WNL in all planes without pain production    Status  On-going      PT LONG TERM GOAL #3   Title  patient to report reduction in headache intensity, frequency, and duration by >/= 50%    Status  On-going      PT LONG TERM GOAL #4   Title  patient to report ability to perform work and household activities without increase in neck pain/tightness    Status  On-going            Plan - 12/08/17 1446    Clinical  Impression Statement  Muscle tension in B UT, LS & cervical paraspinals gradually improving, with R still somewhat tighter than L esp in LS. Significant increased tension and TPs noted in lateral R pec major with R shoulder notably fwd as compared to left. Pt very sensitive to manual therapy over R pecs, but DN not an option here due the presence of implants. Pt instructed in 3 way doorway pec stretch with best stretch noted in mid position.    PT Treatment/Interventions  ADLs/Self Care Home Management;Cryotherapy;Electrical Stimulation;Moist Heat;Traction;Therapeutic exercise;Therapeutic activities;Functional mobility training;Ultrasound;Neuromuscular re-education;Patient/family education;Manual techniques;Vasopneumatic Device;Taping;Dry needling;Passive range of motion    Consulted and Agree with Plan of Care  Patient       Patient will benefit from skilled therapeutic intervention in order to improve the following deficits and impairments:  Pain, Impaired UE functional use, Decreased strength, Decreased activity tolerance  Visit Diagnosis: Cervicalgia  Abnormal posture  Other symptoms and signs involving the musculoskeletal system     Problem List Patient Active Problem List   Diagnosis Date Noted  . Elevated blood pressure reading 10/14/2017  . Nonallopathic lesion of cervical region 10/06/2017  . Nonallopathic lesion of thoracic region 10/06/2017  . Nonallopathic lesion of lumbosacral region 10/06/2017  . Migraine headache 09/17/2017  . Anxiety 09/17/2017  . Neck pain 09/17/2017  . Muscle tightness 09/17/2017  . Palpitations 09/17/2017    Marry GuanJoAnne M Riyaan Heroux, PT, MPT 12/08/2017, 5:53 PM  [facility] [place] [address]  [address] [place], [place], [address] Phone: [phone]   Fax:  [phone]  Name: Margaret Deleon MRN: [id number] Date of Birth: 1980/12/13

## 2017-12-08 NOTE — Progress Notes (Signed)
Subjective:    Patient ID: Margaret Deleon, female    DOB: 02-Apr-1981, 37 y.o.   MRN: [id number]  HPI The patient is here for follow up.  Migraine headaches, neck pain/muscle tightness:  She is following with Dr Katrinka BlazingSmith and doing PT.  The adjustment by Dr Katrinka BlazingSmith helped.  Physical therapy is helping.  She is taking gabapentin nightly.  Overall her neck tightness and migraines have improved.  She does not feel it that she needs to try a new preventative migraine medication at this time.  Anxiety: She is taking her sertraline daily as prescribed.  She is not taking the Ativan in a while, but did find it helpful when she took it.  She does not have any left, but would ideally like to have it on hand if needed.  She denies any side effects from the medication. She feels her anxiety is well controlled and she is happy with her current dose of medication.   Elevated BP, palpitations:    She thinks her palpitations are getting better.  She noticed them at night when she lays on her left side - she also feels hot sometimes at that time.  She sometimes feels it when she is stopping her run and her heart rate is slowing down. She does not feel it during the day.  She drinks one cup of coffee daily only.  Her BP has been good.  She is taking the propranolol daily.  Medications and allergies reviewed with patient and updated if appropriate.  Patient Active Problem List   Diagnosis Date Noted  . Elevated blood pressure reading 10/14/2017  . Nonallopathic lesion of cervical region 10/06/2017  . Nonallopathic lesion of thoracic region 10/06/2017  . Nonallopathic lesion of lumbosacral region 10/06/2017  . Migraine headache 09/17/2017  . Anxiety 09/17/2017  . Neck pain 09/17/2017  . Muscle tightness 09/17/2017  . Palpitations 09/17/2017    Current Outpatient Medications on File Prior to Visit  Medication Sig Dispense Refill  . butalbital-acetaminophen-caffeine (FIORICET, ESGIC) 50-325-40 MG tablet  Take 1-2 tablets by mouth every 6 (six) hours as needed for headache. 20 tablet 0  . cyclobenzaprine (FLEXERIL) 10 MG tablet Take 0.5-1 tablets (5-10 mg total) by mouth at bedtime. 30 tablet 3  . gabapentin (NEURONTIN) 100 MG capsule Take 2 capsules (200 mg total) by mouth at bedtime. 60 capsule 3  . LORazepam (ATIVAN) 0.5 MG tablet Take 0.5 mg by mouth daily as needed for anxiety.  0  . prochlorperazine (COMPAZINE) 10 MG tablet Take 1 tablet (10 mg total) by mouth every 8 (eight) hours as needed for refractory nausea / vomiting (refractory headache). 15 tablet 0  . propranolol ER (INDERAL LA) 60 MG 24 hr capsule Take 1 capsule (60 mg total) by mouth daily. 30 capsule 5  . sertraline (ZOLOFT) 50 MG tablet Take 1 tablet (50 mg total) by mouth at bedtime. 30 tablet 3  . Vitamin D, Ergocalciferol, (DRISDOL) 50000 units CAPS capsule Take 1 capsule (50,000 Units total) by mouth every 7 (seven) days. 12 capsule 0   No current facility-administered medications on file prior to visit.     Past Medical History:  Diagnosis Date  . Depression   . Hypertension   . Migraines     No past surgical history on file.  Social History   Socioeconomic History  . Marital status: Married    Spouse name: Not on file  . Number of children: 3  . Years of education: Not on  file  . Highest education level: Not on file  Occupational History  . Not on file  Social Needs  . Financial resource strain: Not on file  . Food insecurity:    Worry: Not on file    Inability: Not on file  . Transportation needs:    Medical: Not on file    Non-medical: Not on file  Tobacco Use  . Smoking status: Never Smoker  . Smokeless tobacco: Never Used  Substance and Sexual Activity  . Alcohol use: Yes  . Drug use: No  . Sexual activity: Yes  Lifestyle  . Physical activity:    Days per week: Not on file    Minutes per session: Not on file  . Stress: Not on file  Relationships  . Social connections:    Talks on  phone: Not on file    Gets together: Not on file    Attends religious service: Not on file    Active member of club or organization: Not on file    Attends meetings of clubs or organizations: Not on file    Relationship status: Not on file  Other Topics Concern  . Not on file  Social History Narrative  . Not on file    Family History  Problem Relation Age of Onset  . Depression Mother   . Cancer Father   . Alcohol abuse Brother   . Depression Brother   . Migraines Sister     Review of Systems  Constitutional: Negative for chills and fever.  Respiratory: Negative for shortness of breath.   Cardiovascular: Positive for palpitations (intermittent). Negative for chest pain and leg swelling.  Musculoskeletal: Positive for neck pain.  Neurological: Positive for headaches (migraines). Negative for numbness.       Objective:   Vitals:   12/09/17 1414  BP: 112/80  Pulse: 61  Resp: 16  Temp: 98.7 F (37.1 C)  SpO2: 98%   BP Readings from Last 3 Encounters:  12/09/17 112/80  11/27/17 112/80  11/11/17 (!) 160/100   Wt Readings from Last 3 Encounters:  12/09/17 169 lb (76.7 kg)  11/27/17 170 lb (77.1 kg)  11/11/17 169 lb 9.6 oz (76.9 kg)   Body mass index is 26.47 kg/m.   Physical Exam    Constitutional: Appears well-developed and well-nourished. No distress.  HENT:  Head: Normocephalic and atraumatic.  Neck: Neck supple. No tracheal deviation present. No thyromegaly present.  No cervical lymphadenopathy Cardiovascular: Normal rate, regular rhythm and normal heart sounds.   No murmur heard. No carotid bruit .  No edema Pulmonary/Chest: Effort normal and breath sounds normal. No respiratory distress. No has no wheezes. No rales.  Skin: Skin is warm and dry. Not diaphoretic.  Psychiatric: Normal mood and affect. Behavior is normal.      Assessment & Plan:    See Problem List for Assessment and Plan of chronic medical problems.

## 2017-12-09 ENCOUNTER — Ambulatory Visit (INDEPENDENT_AMBULATORY_CARE_PROVIDER_SITE_OTHER): Admitting: Internal Medicine

## 2017-12-09 ENCOUNTER — Encounter: Payer: Self-pay | Admitting: Internal Medicine

## 2017-12-09 VITALS — BP 112/80 | HR 61 | Temp 98.7°F | Resp 16 | Wt 169.0 lb

## 2017-12-09 DIAGNOSIS — R030 Elevated blood-pressure reading, without diagnosis of hypertension: Secondary | ICD-10-CM | POA: Diagnosis not present

## 2017-12-09 DIAGNOSIS — R002 Palpitations: Secondary | ICD-10-CM

## 2017-12-09 DIAGNOSIS — F419 Anxiety disorder, unspecified: Secondary | ICD-10-CM

## 2017-12-09 DIAGNOSIS — G43009 Migraine without aura, not intractable, without status migrainosus: Secondary | ICD-10-CM | POA: Diagnosis not present

## 2017-12-09 MED ORDER — LORAZEPAM 0.5 MG PO TABS: 0.5000 mg | ORAL_TABLET | Freq: Every day | ORAL | 0 refills | Status: DC | PRN

## 2017-12-09 NOTE — Assessment & Plan Note (Signed)
Improved with improving her neck pain Taking propranolol daily Can use Fioricet as needed or Excedrin Migraine as needed

## 2017-12-09 NOTE — Assessment & Plan Note (Signed)
Improved, controlled Continue current dose of sertraline daily Ativan as needed only-refilled today

## 2017-12-09 NOTE — Patient Instructions (Signed)

## 2017-12-09 NOTE — Assessment & Plan Note (Signed)
Improved palpitations, but still experiencing some particularly at night when she lays down on her left side and when she is done running and her heart rate is coming down. No associated concerning symptoms She is still interested in seeing cardiology would like to see someone other than Dr. Garnette CzechBerry-referral ordered

## 2017-12-09 NOTE — Assessment & Plan Note (Signed)
Blood pressure well controlled today We will continue to monitor Taking propranolol for migraine prevention

## 2017-12-11 ENCOUNTER — Encounter: Payer: Self-pay | Admitting: Physical Therapy

## 2017-12-11 ENCOUNTER — Ambulatory Visit: Admitting: Physical Therapy

## 2017-12-11 DIAGNOSIS — R29898 Other symptoms and signs involving the musculoskeletal system: Secondary | ICD-10-CM

## 2017-12-11 DIAGNOSIS — R293 Abnormal posture: Secondary | ICD-10-CM

## 2017-12-11 DIAGNOSIS — M542 Cervicalgia: Secondary | ICD-10-CM | POA: Diagnosis not present

## 2017-12-11 NOTE — Therapy (Signed)
[facility] [place] [address]  [address] [place], [place], [address] Phone: [phone]   Fax:  [phone]  Physical Therapy Treatment  Patient Details  Name: Margaret Deleon MRN: [id number] Date of Birth: 1981-03-24 Referring Provider: Dr. Cheryll CockayneStacy Burns   Encounter Date: 12/11/2017  PT End of Session - 12/11/17 1403    Visit Number  6    Number of Visits  12    Date for PT Re-Evaluation  12/31/17    Authorization Type  [employer]    PT Start Time  1400    PT Stop Time  1458    PT Time Calculation (min)  58 min    Activity Tolerance  Patient tolerated treatment well    Behavior During Therapy  [facility]WFL for tasks assessed/performed       Past Medical History:  Diagnosis Date  . Depression   . Hypertension   . Migraines     History reviewed. No pertinent surgical history.  There were no vitals filed for this visit.  Subjective Assessment - 12/11/17 1401    Subjective  feels like she is better after atarting PT - but still feels tight    Pertinent History  cluster migraine, intermittent high BP, palpitations    Diagnostic tests  none recently    Patient Stated Goals  improve pain/tightness    Currently in Pain?  Yes    Pain Score  2     Pain Location  -- mid back    Pain Orientation  Right;Left;Posterior    Pain Descriptors / Indicators  Tightness    Pain Type  Acute pain                No data recorded       [facility] Adult PT Treatment/Exercise - 12/11/17 1403      Neck Exercises: Machines for Strengthening   UBE (Upper Arm Bike)  L2 x 6 min (3/3)      Neck Exercises: Standing   Other Standing Exercises  X to V - yellow tband x 15 reps    Other Standing Exercises  serratus rolls - red tband x 15 reps      Neck Exercises: Prone   Other Prone Exercise  I, T, Y over green pball x 15 - 1# B UE      Modalities   Modalities  Electrical Stimulation;Moist Heat      Moist Heat Therapy   Number Minutes Moist  Heat  15 Minutes    Moist Heat Location  Shoulder;Cervical;Lumbar Spine      Electrical Stimulation   Electrical Stimulation Location  B UT to shoulder    Electrical Stimulation Action  IFC    Electrical Stimulation Parameters  to tolerance    Electrical Stimulation Goals  Pain;Tone      Manual Therapy   Manual Therapy  Soft tissue mobilization;Myofascial release    Manual therapy comments  patient prone    Soft tissue mobilization  STM to mid and upper back musculature    Myofascial Release  manual TPR to B UT, B LS      Neck Exercises: Stretches   Other Neck Stretches  B hip adductor stretch - 3 x 30 sec    Other Neck Stretches  B gastroc stretch - 3 x 30 sec                  PT Long Term Goals - 11/26/17 1404  PT LONG TERM GOAL #1   Title  patient to be independent with advanced HEP    Status  On-going      PT LONG TERM GOAL #2   Title  patient to demonstrate cervical AROM to WNL in all planes without pain production    Status  On-going      PT LONG TERM GOAL #3   Title  patient to report reduction in headache intensity, frequency, and duration by >/= 50%    Status  On-going      PT LONG TERM GOAL #4   Title  patient to report ability to perform work and household activities without increase in neck pain/tightness    Status  On-going            Plan - 12/11/17 1403    Clinical Impression Statement  Patient making good improvements in general pain and tightness. Did educate on hip adductor and gastroc stretching today as this has been a complaint since her first visit. Good tolerance to all mid-back and periscapular strengthening otherwise.     PT Treatment/Interventions  ADLs/Self Care Home Management;Cryotherapy;Electrical Stimulation;Moist Heat;Traction;Therapeutic exercise;Therapeutic activities;Functional mobility training;Ultrasound;Neuromuscular re-education;Patient/family education;Manual techniques;Vasopneumatic Device;Taping;Dry  needling;Passive range of motion    Consulted and Agree with Plan of Care  Patient       Patient will benefit from skilled therapeutic intervention in order to improve the following deficits and impairments:  Pain, Impaired UE functional use, Decreased strength, Decreased activity tolerance  Visit Diagnosis: Cervicalgia  Abnormal posture  Other symptoms and signs involving the musculoskeletal system     Problem List Patient Active Problem List   Diagnosis Date Noted  . Elevated blood pressure reading 10/14/2017  . Nonallopathic lesion of cervical region 10/06/2017  . Nonallopathic lesion of thoracic region 10/06/2017  . Nonallopathic lesion of lumbosacral region 10/06/2017  . Migraine headache 09/17/2017  . Anxiety 09/17/2017  . Neck pain 09/17/2017  . Muscle tightness 09/17/2017  . Palpitations 09/17/2017     Kipp Laurence, PT, DPT 12/11/17 3:44 PM   [facility] [address]  [address] [place], [place], [address] Phone: [phone]   Fax:  [phone]  Name: Dreana Britz MRN: [id number] Date of Birth: 1981-01-01

## 2017-12-15 ENCOUNTER — Ambulatory Visit: Admitting: Physical Therapy

## 2017-12-18 ENCOUNTER — Encounter: Payer: Self-pay | Admitting: Physical Therapy

## 2017-12-18 ENCOUNTER — Ambulatory Visit: Attending: Internal Medicine | Admitting: Physical Therapy

## 2017-12-18 DIAGNOSIS — R29898 Other symptoms and signs involving the musculoskeletal system: Secondary | ICD-10-CM | POA: Diagnosis present

## 2017-12-18 DIAGNOSIS — R293 Abnormal posture: Secondary | ICD-10-CM | POA: Diagnosis present

## 2017-12-18 DIAGNOSIS — M542 Cervicalgia: Secondary | ICD-10-CM | POA: Insufficient documentation

## 2017-12-18 NOTE — Therapy (Signed)
[facility] [place] [address]  [address] [place], [place], [address] Phone: [phone]   Fax:  [phone]  Physical Therapy Treatment  Patient Details  Name: Margaret Deleon MRN: [id number] Date of Birth: 1981/05/02 Referring Provider: Dr. Cheryll CockayneStacy Burns   Encounter Date: 12/18/2017  PT End of Session - 12/18/17 1404    Visit Number  7    Number of Visits  12    Date for PT Re-Evaluation  12/31/17    Authorization Type  [employer]    PT Start Time  1401    PT Stop Time  1440    PT Time Calculation (min)  39 min    Activity Tolerance  Patient tolerated treatment well    Behavior During Therapy  [facility] for tasks assessed/performed       Past Medical History:  Diagnosis Date  . Anxiety 09/17/2017  . Depression   . Elevated blood pressure reading 10/14/2017   Elevated blood pressure  . Hypertension   . Migraine headache 09/17/2017   Has been on propranolol and labetalol in the past Did not tolerate Topamax Takes Excedrin Migraine as needed, has not tried has tried Fioricet once  . Migraines   . Muscle tightness 09/17/2017  . Neck pain 09/17/2017  . Nonallopathic lesion of cervical region 10/06/2017  . Nonallopathic lesion of lumbosacral region 10/06/2017  . Nonallopathic lesion of thoracic region 10/06/2017  . Palpitations 09/17/2017   Palpitations    History reviewed. No pertinent surgical history.  There were no vitals filed for this visit.  Subjective Assessment - 12/18/17 1403    Subjective  still feels tight    Pertinent History  cluster migraine, intermittent high BP, palpitations    Diagnostic tests  none recently    Patient Stated Goals  improve pain/tightness    Currently in Pain?  Yes    Pain Score  1     Pain Location  -- neck and upper back    Pain Orientation  Right;Left;Posterior    Pain Descriptors / Indicators  Tightness    Pain Type  Acute pain                       [facility] Adult PT  Treatment/Exercise - 12/18/17 1404      Neck Exercises: Machines for Strengthening   UBE (Upper Arm Bike)  L3 x 6 min (3/3)      Manual Therapy   Manual Therapy  Soft tissue mobilization;Myofascial release    Manual therapy comments  patient prone and supine    Soft tissue mobilization  STM to B UT, B LS, B cervical and thoracic paraspinals, B suboccipital mm    Myofascial Release  manual TPR to B UT, B LS       Trigger Point Dry Needling - 12/18/17 1731    Consent Given?  Yes    Muscles Treated Upper Body  Upper trapezius bilateral    Upper Trapezius Response  Twitch reponse elicited;Palpable increased muscle length                PT Long Term Goals - 11/26/17 1404      PT LONG TERM GOAL #1   Title  patient to be independent with advanced HEP    Status  On-going      PT LONG TERM GOAL #2   Title  patient to demonstrate cervical AROM to WNL in all planes without pain production    Status  On-going      PT LONG TERM GOAL #3   Title  patient to report reduction in headache intensity, frequency, and duration by >/= 50%    Status  On-going      PT LONG TERM GOAL #4   Title  patient to report ability to perform work and household activities without increase in neck pain/tightness    Status  On-going            Plan - 12/18/17 1404    Clinical Impression Statement  Patient sick today with PT session focusing heavily on manual therapy as patient reports continued tightness and headache symptoms. Good tissue repsonse with DN as well as STM and manual trigger point release. Patient educated to continue home program for maximum benefit.     PT Treatment/Interventions  ADLs/Self Care Home Management;Cryotherapy;Electrical Stimulation;Moist Heat;Traction;Therapeutic exercise;Therapeutic activities;Functional mobility training;Ultrasound;Neuromuscular re-education;Patient/family education;Manual techniques;Vasopneumatic Device;Taping;Dry needling;Passive range of motion     Consulted and Agree with Plan of Care  Patient       Patient will benefit from skilled therapeutic intervention in order to improve the following deficits and impairments:  Pain, Impaired UE functional use, Decreased strength, Decreased activity tolerance  Visit Diagnosis: Cervicalgia  Abnormal posture  Other symptoms and signs involving the musculoskeletal system     Problem List Patient Active Problem List   Diagnosis Date Noted  . Elevated blood pressure reading 10/14/2017  . Nonallopathic lesion of cervical region 10/06/2017  . Nonallopathic lesion of thoracic region 10/06/2017  . Nonallopathic lesion of lumbosacral region 10/06/2017  . Migraine headache 09/17/2017  . Anxiety 09/17/2017  . Neck pain 09/17/2017  . Muscle tightness 09/17/2017  . Palpitations 09/17/2017     Kipp Laurence, PT, DPT 12/18/17 5:34 PM   [facility] [address]  [address] [place], [place], [address] Phone: [phone]   Fax:  [phone]  Name: Margaret Deleon MRN: [id number] Date of Birth: 10/01/1980

## 2017-12-22 ENCOUNTER — Ambulatory Visit: Admitting: Physical Therapy

## 2017-12-22 NOTE — Progress Notes (Signed)
Cardiology Office Note:    Date:  12/23/2017   ID:  Margaret Deleon, DOB 05/21/1981, MRN [id number]  PCP:  Pincus Sanes, MD  Cardiologist:  Norman Herrlich, MD   Referring MD: Pincus Sanes, MD  ASSESSMENT:    1. Palpitation   2. Elevated blood pressure reading    PLAN:    In order of problems listed above:  1. Her symptoms are quite suggestive of ventricular premature beats and will utilize a 2-week event monitor to document arrhythmia.  She will continue her beta-blocker and avoid proarrhythmic medications with her symptoms related to physical exercise echocardiogram be performed to screen for cardiomyopathy.  I asked her to return back to the office in 4 weeks and follow-up after testing is performed. 2. Repeat blood pressure determination in my office at rest is normal.  I do not think she requires any other antihypertensive agent that her beta-blocker that she takes for migraine  Next appointment 4 weeks    Medication Adjustments/Labs and Tests Ordered: Current medicines are reviewed at length with the patient today.  Concerns regarding medicines are outlined above.  Orders Placed This Encounter  Procedures  . CARDIAC EVENT MONITOR  . EKG 12-Lead  . ECHOCARDIOGRAM COMPLETE   No orders of the defined types were placed in this encounter.    Chief Complaint  Patient presents with  . Palpitations    History of Present Illness:    Margaret Deleon is a 37 y.o. female who is being seen today for the evaluation of palpitation at the request of Burns, Bobette Mo, MD. She has noticed increasing palpitations that she describes more sensation of pausing in her heartbeat often associated brief breathlessness or lightheadedness.  The symptoms occur frequently several times a week occur at rest and with physical activity such as running.  She has had no syncope.  No chest pain and no sustained rapid heart rhythm.  She has no family history of cardiomyopathy or sudden cardiac death.   Laboratory studies include CBC and thyroid are normal.  She has been taking over-the-counter p.m. medication which may be contributory.  She takes a beta-blocker for migraine  Past Medical History:  Diagnosis Date  . Anxiety 09/17/2017  . Depression   . Elevated blood pressure reading 10/14/2017   Elevated blood pressure  . Hypertension   . Migraine headache 09/17/2017   Has been on propranolol and labetalol in the past Did not tolerate Topamax Takes Excedrin Migraine as needed, has not tried has tried Fioricet once  . Migraines   . Muscle tightness 09/17/2017  . Neck pain 09/17/2017  . Nonallopathic lesion of cervical region 10/06/2017  . Nonallopathic lesion of lumbosacral region 10/06/2017  . Nonallopathic lesion of thoracic region 10/06/2017  . Palpitations 09/17/2017   Palpitations    Past Surgical History:  Procedure Laterality Date  . BREAST ENHANCEMENT SURGERY    . CESAREAN SECTION      Current Medications: Current Meds  Medication Sig  . butalbital-acetaminophen-caffeine (FIORICET, ESGIC) 50-325-40 MG tablet Take 1-2 tablets by mouth every 6 (six) hours as needed for headache.  . cyclobenzaprine (FLEXERIL) 10 MG tablet Take 0.5-1 tablets (5-10 mg total) by mouth at bedtime.  . gabapentin (NEURONTIN) 100 MG capsule Take 2 capsules (200 mg total) by mouth at bedtime.  [name] Kitchen LORazepam (ATIVAN) 0.5 MG tablet Take 1 tablet (0.5 mg total) by mouth daily as needed for anxiety.  . prochlorperazine (COMPAZINE) 10 MG tablet Take 1 tablet (10 mg total) by mouth  every 8 (eight) hours as needed for refractory nausea / vomiting (refractory headache).  . propranolol ER (INDERAL LA) 60 MG 24 hr capsule Take 1 capsule (60 mg total) by mouth daily.  . sertraline (ZOLOFT) 50 MG tablet Take 1 tablet (50 mg total) by mouth at bedtime.     Allergies:   Codeine and Topamax [topiramate]   Social History   Socioeconomic History  . Marital status: Married    Spouse name: Not on file  . Number of children:  3  . Years of education: Not on file  . Highest education level: Not on file  Occupational History  . Not on file  Social Needs  . Financial resource strain: Not on file  . Food insecurity:    Worry: Not on file    Inability: Not on file  . Transportation needs:    Medical: Not on file    Non-medical: Not on file  Tobacco Use  . Smoking status: Never Smoker  . Smokeless tobacco: Never Used  Substance and Sexual Activity  . Alcohol use: Yes    Comment: occasionally   . Drug use: No  . Sexual activity: Yes  Lifestyle  . Physical activity:    Days per week: Not on file    Minutes per session: Not on file  . Stress: Not on file  Relationships  . Social connections:    Talks on phone: Not on file    Gets together: Not on file    Attends religious service: Not on file    Active member of club or organization: Not on file    Attends meetings of clubs or organizations: Not on file    Relationship status: Not on file  Other Topics Concern  . Not on file  Social History Narrative  . Not on file     Family History: The patient's family history includes Alcohol abuse in her brother; Cancer in her father; Depression in her brother and mother; Migraines in her sister.  ROS:   ROS Please see the history of present illness.     All other systems reviewed and are negative.  EKGs/Labs/Other Studies Reviewed:    The following studies were reviewed today:   EKG: The ekg ordered 11/11/17 demonstrates SRTH minor non specific ST depression OW normal  Recent Labs: 09/08/2017: BUN 7; Creatinine, Ser 1.00; Potassium 4.5; Sodium 141 10/06/2017: Hemoglobin 13.6; Platelets 260.0; TSH 1.15  Recent Lipid Panel No results found for: CHOL, TRIG, HDL, CHOLHDL, VLDL, LDLCALC, LDLDIRECT  Physical Exam:    VS:  BP 124/80   Pulse 83   Ht 5\' 7"  (1.702 m)   Wt 170 lb 1.9 oz (77.2 kg)   SpO2 98%   BMI 26.64 kg/m     Wt Readings from Last 3 Encounters:  12/23/17 170 lb 1.9 oz (77.2 kg)    12/09/17 169 lb (76.7 kg)  11/27/17 170 lb (77.1 kg)     GEN:  Well nourished, well developed in no acute distress HEENT: Normal NECK: No JVD; No carotid bruits LYMPHATICS: No lymphadenopathy CARDIAC: RRR, no murmurs, rubs, gallops RESPIRATORY:  Clear to auscultation without rales, wheezing or rhonchi  ABDOMEN: Soft, non-tender, non-distended MUSCULOSKELETAL:  No edema; No deformity  SKIN: Warm and dry NEUROLOGIC:  Alert and oriented x 3 PSYCHIATRIC:  Normal affect     Signed, Norman HerrlichBrian Munley, MD  12/23/2017 5:11 PM    [facility]

## 2017-12-23 ENCOUNTER — Encounter: Payer: Self-pay | Admitting: Cardiology

## 2017-12-23 ENCOUNTER — Ambulatory Visit (INDEPENDENT_AMBULATORY_CARE_PROVIDER_SITE_OTHER): Admitting: Cardiology

## 2017-12-23 VITALS — BP 124/80 | HR 83 | Ht 67.0 in | Wt 170.1 lb

## 2017-12-23 DIAGNOSIS — R030 Elevated blood-pressure reading, without diagnosis of hypertension: Secondary | ICD-10-CM

## 2017-12-23 DIAGNOSIS — R002 Palpitations: Secondary | ICD-10-CM | POA: Diagnosis not present

## 2017-12-23 NOTE — Patient Instructions (Addendum)
Medication Instructions:  Your physician recommends that you continue on your current medications as directed. Please refer to the Current Medication list given to you today.  Labwork: None  Testing/Procedures: You had an EKG today.  Your physician has requested that you have an echocardiogram. Echocardiography is a painless test that uses sound waves to create images of your heart. It provides your doctor with information about the size and shape of your heart and how well your heart's chambers and valves are working. This procedure takes approximately one hour. There are no restrictions for this procedure.  Your physician has recommended that you wear an event monitor. Event monitors are medical devices that record the heart's electrical activity. Doctors most often us these monitors to diagnose arrhythmias. Arrhythmias are problems with the speed or rhythm of the heartbeat. The monitor is a small, portable device. You can wear one while you do your normal daily activities. This is usually used to diagnose what is causing palpitations/syncope (passing out). 2 weeks.  Follow-Up: Your physician recommends that you schedule a follow-up appointment in: 4 weeks.  Any Other Special Instructions Will Be Listed Below (If Applicable).     If you need a refill on your cardiac medications before your next appointment, please call your pharmacy.    1. Avoid all over-the-counter antihistamines except Claritin/Loratadine and Zyrtec/Cetrizine. 2. Avoid all combination including cold sinus allergies flu decongestant and sleep medications 3. You can use Robitussin DM Mucinex and Mucinex DM for cough. 4. can use Tylenol aspirin ibuprofen and naproxen but no combinations such as sleep or sinus.  KardiaMobile [email or web address]        FDA-cleared, clinical grade mobile EKG monitor: Lourena SimmondsKardia is the most clinically-validated mobile EKG used by the world's leading cardiac  care medical professionals With Basic service, know instantly if your heart rhythm is normal or if atrial fibrillation is detected, and email the last single EKG recording to yourself or your doctor Premium service, available for purchase through the Kardia app for $9.99 per month or $99 per year, includes unlimited history and storage of your EKG recordings, a monthly EKG summary report to share with your doctor, along with the ability to track your blood pressure, activity and weight Includes one KardiaMobile phone clip FREE SHIPPING: Standard delivery 1-3 business days. Orders placed by 11:00am PST will ship that afternoon. Otherwise, will ship next business day. All orders ship via [employer] Priority Mail from [place]

## 2017-12-25 ENCOUNTER — Encounter: Payer: Self-pay | Admitting: Physical Therapy

## 2017-12-25 ENCOUNTER — Ambulatory Visit: Admitting: Physical Therapy

## 2017-12-25 DIAGNOSIS — M542 Cervicalgia: Secondary | ICD-10-CM

## 2017-12-25 DIAGNOSIS — R293 Abnormal posture: Secondary | ICD-10-CM

## 2017-12-25 DIAGNOSIS — R29898 Other symptoms and signs involving the musculoskeletal system: Secondary | ICD-10-CM

## 2017-12-25 NOTE — Therapy (Signed)
[facility] Outpatient Rehabilitation [facility] [address]  [address] [place], [place], [address] Phone: [phone]   Fax:  [phone]  Physical Therapy Treatment  Patient Details  Name: Margaret Deleon MRN: [id number] Date of Birth: 09/23/1980 Referring Provider: Dr. Cheryll Cockayne   Encounter Date: 12/25/2017  PT End of Session - 12/25/17 1406    Visit Number  8    Number of Visits  12    Date for PT Re-Evaluation  12/31/17    Authorization Type  [employer]    PT Start Time  1404    PT Stop Time  1445    PT Time Calculation (min)  41 min    Activity Tolerance  Patient tolerated treatment well    Behavior During Therapy  [facility] for tasks assessed/performed       Past Medical History:  Diagnosis Date  . Anxiety 09/17/2017  . Depression   . Elevated blood pressure reading 10/14/2017   Elevated blood pressure  . Hypertension   . Migraine headache 09/17/2017   Has been on propranolol and labetalol in the past Did not tolerate Topamax Takes Excedrin Migraine as needed, has not tried has tried Fioricet once  . Migraines   . Muscle tightness 09/17/2017  . Neck pain 09/17/2017  . Nonallopathic lesion of cervical region 10/06/2017  . Nonallopathic lesion of lumbosacral region 10/06/2017  . Nonallopathic lesion of thoracic region 10/06/2017  . Palpitations 09/17/2017   Palpitations    Past Surgical History:  Procedure Laterality Date  . BREAST ENHANCEMENT SURGERY    . CESAREAN SECTION      There were no vitals filed for this visit.  Subjective Assessment - 12/25/17 1404    Subjective  feeling well - did well after DN last session    Pertinent History  cluster migraine, intermittent high BP, palpitations    Diagnostic tests  none recently    Patient Stated Goals  improve pain/tightness    Currently in Pain?  Yes    Pain Score  3     Pain Location  Neck and upper back    Pain Orientation  Right;Left;Posterior    Pain Descriptors / Indicators  Aching;Sore;Tightness     Pain Type  Acute pain                       [facility] Adult PT Treatment/Exercise - 12/25/17 1408      Neck Exercises: Machines for Strengthening   UBE (Upper Arm Bike)  L3 x 6 min (3/3)      Neck Exercises: Theraband   Shoulder External Rotation  15 reps;Red standing against pool noodle    Horizontal ABduction  15 reps;Red standing against pool noodle    Other Theraband Exercises  resisted wall clocks - red tband x 10 reps each UE      Neck Exercises: Standing   Other Standing Exercises  B row - TRX x 15      Manual Therapy   Manual Therapy  Myofascial release    Manual therapy comments  patient prone    Joint Mobilization  CPAs (grade III) to lower cervical and upper thoracic segments    Soft tissue mobilization  STM to B UT, B cervical and thoracic paraspinals, B LS, B rhomboid    Myofascial Release  manual TPR to B LS and rhomboids      Neck Exercises: Stretches   Other Neck Stretches  foam roller to thoracic paraspinals; self mobilization with  foam roller                  PT Long Term Goals - 11/26/17 1404      PT LONG TERM GOAL #1   Title  patient to be independent with advanced HEP    Status  On-going      PT LONG TERM GOAL #2   Title  patient to demonstrate cervical AROM to WNL in all planes without pain production    Status  On-going      PT LONG TERM GOAL #3   Title  patient to report reduction in headache intensity, frequency, and duration by >/= 50%    Status  On-going      PT LONG TERM GOAL #4   Title  patient to report ability to perform work and household activities without increase in neck pain/tightness    Status  On-going            Plan - 12/25/17 1407    Clinical Impression Statement  Margaret Deleon doing well today - felt much better DN at last session - but today reporting more mid back pain. STM and manual trigger point release to this regaion at B thoracic paraspinals and rhomboids - also educated on self mobilizations  with foam roller with good garryover for hopeful continued benefit within the home. Will continue to progress as tolerated.     PT Treatment/Interventions  ADLs/Self Care Home Management;Cryotherapy;Electrical Stimulation;Moist Heat;Traction;Therapeutic exercise;Therapeutic activities;Functional mobility training;Ultrasound;Neuromuscular re-education;Patient/family education;Manual techniques;Vasopneumatic Device;Taping;Dry needling;Passive range of motion    Consulted and Agree with Plan of Care  Patient       Patient will benefit from skilled therapeutic intervention in order to improve the following deficits and impairments:  Pain, Impaired UE functional use, Decreased strength, Decreased activity tolerance  Visit Diagnosis: Cervicalgia  Abnormal posture  Other symptoms and signs involving the musculoskeletal system     Problem List Patient Active Problem List   Diagnosis Date Noted  . Elevated blood pressure reading 10/14/2017  . Nonallopathic lesion of cervical region 10/06/2017  . Nonallopathic lesion of thoracic region 10/06/2017  . Nonallopathic lesion of lumbosacral region 10/06/2017  . Migraine headache 09/17/2017  . Anxiety 09/17/2017  . Neck pain 09/17/2017  . Muscle tightness 09/17/2017  . Palpitation 09/17/2017     Kipp LaurenceStephanie R Aaron, PT, DPT 12/25/17 5:40 PM   [facility] [place] [address]  [address] [place], [place], [address] Phone: [phone]   Fax:  [phone]  Name: Margaret Deleon MRN: [id number] Date of Birth: May 01, 1981

## 2018-01-05 ENCOUNTER — Ambulatory Visit: Admitting: Family Medicine

## 2018-01-14 ENCOUNTER — Encounter

## 2018-01-14 ENCOUNTER — Ambulatory Visit (HOSPITAL_BASED_OUTPATIENT_CLINIC_OR_DEPARTMENT_OTHER)
Admission: RE | Admit: 2018-01-14 | Discharge: 2018-01-14 | Disposition: A | Discharge status: Home / Self Care | Source: Ambulatory Visit | Attending: Cardiology | Admitting: Cardiology

## 2018-01-14 DIAGNOSIS — R002 Palpitations: Secondary | ICD-10-CM | POA: Insufficient documentation

## 2018-01-14 NOTE — Progress Notes (Signed)
Echocardiogram 2D Echocardiogram has been performed.  Margaret Deleon 01/14/2018, 1:48 PM

## 2018-01-19 ENCOUNTER — Ambulatory Visit: Attending: Internal Medicine | Admitting: Physical Therapy

## 2018-01-19 ENCOUNTER — Encounter: Payer: Self-pay | Admitting: Physical Therapy

## 2018-01-19 DIAGNOSIS — R293 Abnormal posture: Secondary | ICD-10-CM | POA: Diagnosis present

## 2018-01-19 DIAGNOSIS — M542 Cervicalgia: Secondary | ICD-10-CM | POA: Diagnosis present

## 2018-01-19 DIAGNOSIS — R29898 Other symptoms and signs involving the musculoskeletal system: Secondary | ICD-10-CM | POA: Insufficient documentation

## 2018-01-19 NOTE — Therapy (Signed)
[facility] Outpatient Rehabilitation [facility] [address]  [address] [place], [place], [address] Phone: [phone]   Fax:  [phone]  Physical Therapy Treatment  Patient Details  Name: Margaret Deleon MRN: [id number] Date of Birth: 1981-02-06 Referring Provider: Dr. Cheryll Cockayne   Encounter Date: 01/19/2018  PT End of Session - 01/19/18 1434    Visit Number  9    Number of Visits  20    Date for PT Re-Evaluation  02/23/18    Authorization Type  [employer]    PT Start Time  1400    PT Stop Time  1429    PT Time Calculation (min)  29 min    Activity Tolerance  Patient tolerated treatment well    Behavior During Therapy  [facility] for tasks assessed/performed       Past Medical History:  Diagnosis Date  . Anxiety 09/17/2017  . Depression   . Elevated blood pressure reading 10/14/2017   Elevated blood pressure  . Hypertension   . Migraine headache 09/17/2017   Has been on propranolol and labetalol in the past Did not tolerate Topamax Takes Excedrin Migraine as needed, has not tried has tried Fioricet once  . Migraines   . Muscle tightness 09/17/2017  . Neck pain 09/17/2017  . Nonallopathic lesion of cervical region 10/06/2017  . Nonallopathic lesion of lumbosacral region 10/06/2017  . Nonallopathic lesion of thoracic region 10/06/2017  . Palpitations 09/17/2017   Palpitations    Past Surgical History:  Procedure Laterality Date  . BREAST ENHANCEMENT SURGERY    . CESAREAN SECTION      There were no vitals filed for this visit.  Subjective Assessment - 01/19/18 1400    Subjective  Patient reports "headaches nonstop for past 2 weeks" since she has not been to PT. Says she had an echo done and says MD reported "something minor with one of the valves but they said it was nothing to worry about." Says she is wearing heart monitor today for the heart issues.  Reports extreme and persistent tightness in the back of her neck.    Pertinent History  cluster migraine,  intermittent high BP, palpitations    Diagnostic tests  none recently    Patient Stated Goals  improve pain/tightness    Currently in Pain?  Yes    Pain Score  5     Pain Location  Head    Pain Orientation  Right;Left    Pain Descriptors / Indicators  Aching;Sore;Tightness    Pain Type  Acute pain                       [facility] Adult PT Treatment/Exercise - 01/19/18 1407      Neck Exercises: Machines for Strengthening   UBE (Upper Arm Bike)  L3 x 6 min (3/3)      Manual Therapy   Manual therapy comments  patient prone    Joint Mobilization  CPAs (grade III-IV) to lower cervical and upper thoracic segments    Soft tissue mobilization  STM to B UT, B cervical paraspinals       Trigger Point Dry Needling - 01/19/18 1408    Consent Given?  Yes    Muscles Treated Upper Body  Upper trapezius Bilateral    Upper Trapezius Response  Twitch reponse elicited;Palpable increased muscle length                PT Long Term Goals - 11/26/17 1404  PT LONG TERM GOAL #1   Title  patient to be independent with advanced HEP    Status  On-going      PT LONG TERM GOAL #2   Title  patient to demonstrate cervical AROM to WNL in all planes without pain production    Status  On-going      PT LONG TERM GOAL #3   Title  patient to report reduction in headache intensity, frequency, and duration by >/= 50%    Status  On-going      PT LONG TERM GOAL #4   Title  patient to report ability to perform work and household activities without increase in neck pain/tightness    Status  On-going            Plan - 01/19/18 1428    Clinical Impression Statement  Patient reports she has had significant headaches for the past 2 weeks. Tolerated UBE for soft tissue warm-up. DN and STM continued today with good patient tolerance; noted heavy twitch response with palpable taut bands during treatment. Able to reduce tissue tightness per patient's subjective reports. Will plan to extend  POC to allow for continued improvement in mobility and general pain patterns as patient has noted great benefit from PT and some symptoms provocation in 3 week break from PT.    PT Treatment/Interventions  ADLs/Self Care Home Management;Cryotherapy;Electrical Stimulation;Moist Heat;Traction;Therapeutic exercise;Therapeutic activities;Functional mobility training;Ultrasound;Neuromuscular re-education;Patient/family education;Manual techniques;Vasopneumatic Device;Taping;Dry needling;Passive range of motion    Consulted and Agree with Plan of Care  Patient       Patient will benefit from skilled therapeutic intervention in order to improve the following deficits and impairments:  Pain, Impaired UE functional use, Decreased strength, Decreased activity tolerance  Visit Diagnosis: Cervicalgia  Abnormal posture  Other symptoms and signs involving the musculoskeletal system     Problem List Patient Active Problem List   Diagnosis Date Noted  . Elevated blood pressure reading 10/14/2017  . Nonallopathic lesion of cervical region 10/06/2017  . Nonallopathic lesion of thoracic region 10/06/2017  . Nonallopathic lesion of lumbosacral region 10/06/2017  . Migraine headache 09/17/2017  . Anxiety 09/17/2017  . Neck pain 09/17/2017  . Muscle tightness 09/17/2017  . Palpitation 09/17/2017    Kipp Laurence, PT, DPT 01/19/18 2:36 PM   [facility] Outpatient Rehabilitation [facility] [address]  [address] [place], [place], [address] Phone: [phone]   Fax:  [phone]  Name: Margaret Deleon MRN: [id number] Date of Birth: 01-11-1981

## 2018-01-22 ENCOUNTER — Encounter: Payer: Self-pay | Admitting: Physical Therapy

## 2018-01-22 ENCOUNTER — Ambulatory Visit: Admitting: Physical Therapy

## 2018-01-22 DIAGNOSIS — M542 Cervicalgia: Secondary | ICD-10-CM | POA: Diagnosis not present

## 2018-01-22 DIAGNOSIS — R293 Abnormal posture: Secondary | ICD-10-CM

## 2018-01-22 DIAGNOSIS — R29898 Other symptoms and signs involving the musculoskeletal system: Secondary | ICD-10-CM

## 2018-01-22 NOTE — Therapy (Signed)
[facility] Outpatient Rehabilitation [facility] [address]  [address] [place], [place], [address] Phone: [phone]   Fax:  [phone]  Physical Therapy Treatment  Patient Details  Name: Margaret Deleon MRN: [id number] Date of Birth: Apr 13, 1981 Referring Provider: Dr. Cheryll Cockayne   Encounter Date: 01/22/2018  PT End of Session - 01/22/18 1401    Visit Number  10    Number of Visits  20    Date for PT Re-Evaluation  02/23/18    Authorization Type  [employer]    PT Start Time  1358    PT Stop Time  1444    PT Time Calculation (min)  46 min    Activity Tolerance  Patient tolerated treatment well    Behavior During Therapy  [facility] for tasks assessed/performed       Past Medical History:  Diagnosis Date  . Anxiety 09/17/2017  . Depression   . Elevated blood pressure reading 10/14/2017   Elevated blood pressure  . Hypertension   . Migraine headache 09/17/2017   Has been on propranolol and labetalol in the past Did not tolerate Topamax Takes Excedrin Migraine as needed, has not tried has tried Fioricet once  . Migraines   . Muscle tightness 09/17/2017  . Neck pain 09/17/2017  . Nonallopathic lesion of cervical region 10/06/2017  . Nonallopathic lesion of lumbosacral region 10/06/2017  . Nonallopathic lesion of thoracic region 10/06/2017  . Palpitations 09/17/2017   Palpitations    Past Surgical History:  Procedure Laterality Date  . BREAST ENHANCEMENT SURGERY    . CESAREAN SECTION      There were no vitals filed for this visit.  Subjective Assessment - 01/22/18 1359    Subjective  hasn't had a headache since last visit - able to go for a run    Pertinent History  cluster migraine, intermittent high BP, palpitations    Diagnostic tests  none recently    Patient Stated Goals  improve pain/tightness    Currently in Pain?  Yes    Pain Score  2     Pain Location  Neck and upper back    Pain Descriptors / Indicators  Tightness;Sore    Pain Type  Acute pain                        [facility] Adult PT Treatment/Exercise - 01/22/18 1401      Neck Exercises: Machines for Strengthening   UBE (Upper Arm Bike)  L3 x 6 min (3/3)      Neck Exercises: Theraband   Shoulder External Rotation  15 reps;Green hooklying over pool noodle    Horizontal ABduction  15 reps;Green hooklying on pool noodle      Neck Exercises: Supine   Other Supine Exercise  bird dog - 2# UE x 10 reps    Other Supine Exercise  B isometric hip flexion 15 x 10 sec hold      Neck Exercises: Prone   Other Prone Exercise  I, T, Y over green pball x 15 - 2# B UE    Other Prone Exercise  superman 10 x 10 sec hold      Manual Therapy   Manual Therapy  Soft tissue mobilization    Manual therapy comments  patient prone    Soft tissue mobilization  STM to B UT, B LS, B cervical paraspinals, B suboccipital mm.      Neck Exercises: Stretches   Other Neck Stretches  pec stretch over foam roller x 3 min alternating movements    Other Neck Stretches  B piriformis stretch - 2 x 30 sec - supine fig 4                  PT Long Term Goals - 11/26/17 1404      PT LONG TERM GOAL #1   Title  patient to be independent with advanced HEP    Status  On-going      PT LONG TERM GOAL #2   Title  patient to demonstrate cervical AROM to WNL in all planes without pain production    Status  On-going      PT LONG TERM GOAL #3   Title  patient to report reduction in headache intensity, frequency, and duration by >/= 50%    Status  On-going      PT LONG TERM GOAL #4   Title  patient to report ability to perform work and household activities without increase in neck pain/tightness    Status  On-going            Plan - 01/22/18 1401    Clinical Impression Statement  [place] today reporting being symptom free of headaches since last visit as well as ability to return to running without issue. PT session today focusing on mid back as well as core strengthening and gentle  stretching with good tolerance. Will plan to continue to progress towards goals.     PT Treatment/Interventions  ADLs/Self Care Home Management;Cryotherapy;Electrical Stimulation;Moist Heat;Traction;Therapeutic exercise;Therapeutic activities;Functional mobility training;Ultrasound;Neuromuscular re-education;Patient/family education;Manual techniques;Vasopneumatic Device;Taping;Dry needling;Passive range of motion    Consulted and Agree with Plan of Care  Patient       Patient will benefit from skilled therapeutic intervention in order to improve the following deficits and impairments:  Pain, Impaired UE functional use, Decreased strength, Decreased activity tolerance  Visit Diagnosis: Cervicalgia  Abnormal posture  Other symptoms and signs involving the musculoskeletal system     Problem List Patient Active Problem List   Diagnosis Date Noted  . Elevated blood pressure reading 10/14/2017  . Nonallopathic lesion of cervical region 10/06/2017  . Nonallopathic lesion of thoracic region 10/06/2017  . Nonallopathic lesion of lumbosacral region 10/06/2017  . Migraine headache 09/17/2017  . Anxiety 09/17/2017  . Neck pain 09/17/2017  . Muscle tightness 09/17/2017  . Palpitation 09/17/2017     Kipp Laurence, PT, DPT 01/22/18 3:48 PM   [facility] [address]  [address] [place], [place], [address] Phone: [phone]   Fax:  [phone]  Name: Ifrah Vest MRN: [id number] Date of Birth: July 26, 1981

## 2018-01-26 ENCOUNTER — Encounter: Payer: Self-pay | Admitting: Physical Therapy

## 2018-01-26 ENCOUNTER — Ambulatory Visit: Admitting: Physical Therapy

## 2018-01-26 DIAGNOSIS — R29898 Other symptoms and signs involving the musculoskeletal system: Secondary | ICD-10-CM

## 2018-01-26 DIAGNOSIS — M542 Cervicalgia: Secondary | ICD-10-CM | POA: Diagnosis not present

## 2018-01-26 DIAGNOSIS — R293 Abnormal posture: Secondary | ICD-10-CM

## 2018-01-26 NOTE — Therapy (Signed)
[facility] Outpatient Rehabilitation [facility] [address]  [address] [place], [place], [address] Phone: [phone]   Fax:  [phone]  Physical Therapy Treatment  Patient Details  Name: Margaret Deleon MRN: [id number] Date of Birth: 1981/02/05 Referring Provider: Dr. Cheryll Cockayne   Encounter Date: 01/26/2018  PT End of Session - 01/26/18 1403    Visit Number  11    Number of Visits  20    Date for PT Re-Evaluation  02/23/18    Authorization Type  [employer]    PT Start Time  1400    PT Stop Time  1443    PT Time Calculation (min)  43 min    Activity Tolerance  Patient tolerated treatment well    Behavior During Therapy  [facility] for tasks assessed/performed       Past Medical History:  Diagnosis Date  . Anxiety 09/17/2017  . Depression   . Elevated blood pressure reading 10/14/2017   Elevated blood pressure  . Hypertension   . Migraine headache 09/17/2017   Has been on propranolol and labetalol in the past Did not tolerate Topamax Takes Excedrin Migraine as needed, has not tried has tried Fioricet once  . Migraines   . Muscle tightness 09/17/2017  . Neck pain 09/17/2017  . Nonallopathic lesion of cervical region 10/06/2017  . Nonallopathic lesion of lumbosacral region 10/06/2017  . Nonallopathic lesion of thoracic region 10/06/2017  . Palpitations 09/17/2017   Palpitations    Past Surgical History:  Procedure Laterality Date  . BREAST ENHANCEMENT SURGERY    . CESAREAN SECTION      There were no vitals filed for this visit.  Subjective Assessment - 01/26/18 1402    Subjective  doing well - some upper body soreness after throwing daughter around yesterday    Pertinent History  cluster migraine, intermittent high BP, palpitations    Diagnostic tests  none recently    Patient Stated Goals  improve pain/tightness    Currently in Pain?  Yes    Pain Score  2     Pain Location  Neck and upper back/shoulders    Pain Orientation  Right;Left    Pain Descriptors /  Indicators  Sore    Pain Type  Acute pain                       [facility] Adult PT Treatment/Exercise - 01/26/18 1403      Neck Exercises: Machines for Strengthening   UBE (Upper Arm Bike)  L3 x 6 min (3/3)    Lat Pull  20# - 15 reps      Neck Exercises: Theraband   Other Theraband Exercises  resisted wall clocks - red tband x 10 reps each UE      Neck Exercises: Standing   Wall Push Ups  15 reps orange pball at wall    Other Standing Exercises  B row - TRX x 15      Manual Therapy   Manual Therapy  Soft tissue mobilization;Myofascial release;Passive ROM; patient education for theracane    Manual therapy comments  patient supine    Soft tissue mobilization  STM to B UT, B LS, B infra, B pec, B cervical paraspinals, B suboccipital mm    Myofascial Release  suboccipital release 2 x 40 sec    Passive ROM  B UT stretch 1 x60 sec each side  PT Long Term Goals - 11/26/17 1404      PT LONG TERM GOAL #1   Title  patient to be independent with advanced HEP    Status  On-going      PT LONG TERM GOAL #2   Title  patient to demonstrate cervical AROM to WNL in all planes without pain production    Status  On-going      PT LONG TERM GOAL #3   Title  patient to report reduction in headache intensity, frequency, and duration by >/= 50%    Status  On-going      PT LONG TERM GOAL #4   Title  patient to report ability to perform work and household activities without increase in neck pain/tightness    Status  On-going            Plan - 01/26/18 1403    Clinical Impression Statement  Brytnee doing ewll - reporting some neck/upper back/shoulder pain today after playing with daughter over weekend. Continued muscle tightness and soreness with TTP at neck adn upper back today with good responce to STM - hopeful for improved carryover as patient has reported less frequent and intense headaches since returning to PT. Patient tolerable to all periscapular  strengthening with no increase in pain/soreness.     PT Treatment/Interventions  ADLs/Self Care Home Management;Cryotherapy;Electrical Stimulation;Moist Heat;Traction;Therapeutic exercise;Therapeutic activities;Functional mobility training;Ultrasound;Neuromuscular re-education;Patient/family education;Manual techniques;Vasopneumatic Device;Taping;Dry needling;Passive range of motion    Consulted and Agree with Plan of Care  Patient       Patient will benefit from skilled therapeutic intervention in order to improve the following deficits and impairments:  Pain, Impaired UE functional use, Decreased strength, Decreased activity tolerance  Visit Diagnosis: Cervicalgia  Abnormal posture  Other symptoms and signs involving the musculoskeletal system     Problem List Patient Active Problem List   Diagnosis Date Noted  . Elevated blood pressure reading 10/14/2017  . Nonallopathic lesion of cervical region 10/06/2017  . Nonallopathic lesion of thoracic region 10/06/2017  . Nonallopathic lesion of lumbosacral region 10/06/2017  . Migraine headache 09/17/2017  . Anxiety 09/17/2017  . Neck pain 09/17/2017  . Muscle tightness 09/17/2017  . Palpitation 09/17/2017    Kipp Laurence, PT, DPT 01/26/18 2:52 PM   [facility] [address]  [address] [place], [place], [address] Phone: [phone]   Fax:  [phone]  Name: Karisma Meiser MRN: [id number] Date of Birth: October 28, 1980

## 2018-01-29 ENCOUNTER — Encounter: Payer: Self-pay | Admitting: Physical Therapy

## 2018-01-29 ENCOUNTER — Ambulatory Visit: Admitting: Physical Therapy

## 2018-01-29 DIAGNOSIS — M542 Cervicalgia: Secondary | ICD-10-CM

## 2018-01-29 DIAGNOSIS — R293 Abnormal posture: Secondary | ICD-10-CM

## 2018-01-29 DIAGNOSIS — R29898 Other symptoms and signs involving the musculoskeletal system: Secondary | ICD-10-CM

## 2018-01-29 NOTE — Therapy (Signed)
[facility] Outpatient Rehabilitation [facility] [address]  [address] [place], [place], [address] Phone: [phone]   Fax:  [phone]  Physical Therapy Treatment  Patient Details  Name: Margaret Deleon MRN: [id number] Date of Birth: 1981-02-02 Referring Provider: Dr. Cheryll Cockayne   Encounter Date: 01/29/2018  PT End of Session - 01/29/18 1503    Visit Number  12    Number of Visits  20    Date for PT Re-Evaluation  02/23/18    Authorization Type  [employer]    PT Start Time  1403    PT Stop Time  1458    PT Time Calculation (min)  55 min    Activity Tolerance  Patient tolerated treatment well    Behavior During Therapy  [facility] for tasks assessed/performed       Past Medical History:  Diagnosis Date  . Anxiety 09/17/2017  . Depression   . Elevated blood pressure reading 10/14/2017   Elevated blood pressure  . Hypertension   . Migraine headache 09/17/2017   Has been on propranolol and labetalol in the past Did not tolerate Topamax Takes Excedrin Migraine as needed, has not tried has tried Fioricet once  . Migraines   . Muscle tightness 09/17/2017  . Neck pain 09/17/2017  . Nonallopathic lesion of cervical region 10/06/2017  . Nonallopathic lesion of lumbosacral region 10/06/2017  . Nonallopathic lesion of thoracic region 10/06/2017  . Palpitations 09/17/2017   Palpitations    Past Surgical History:  Procedure Laterality Date  . BREAST ENHANCEMENT SURGERY    . CESAREAN SECTION      There were no vitals filed for this visit.  Subjective Assessment - 01/29/18 1404    Subjective  Patient reports she has had LBP a couple days ago as well as tightness in L leg. Last night her leg was cramping and could not get relief. Pain has been in the adductors and has tried stretching it out. Reports HAs and neck stiffness has gotten a bit better.   Diagnostic tests  none recently    Patient Stated Goals  improve pain/tightness    Currently in Pain?  Yes    Pain Score  5      Pain Location  Back    Pain Orientation  Lower    Pain Descriptors / Indicators  Tightness;Sore    Multiple Pain Sites  No                       [facility] Adult PT Treatment/Exercise - 01/29/18 1412      Exercises   Exercises  Knee/Hip      Knee/Hip Exercises: Stretches   Hip Flexor Stretch  Left;2 reps;30 seconds;Limitations    Hip Flexor Stretch Limitations  Prone on 1/2 bolster with strap    Piriformis Stretch  Left;2 reps;20 seconds    Piriformis Stretch Limitations  L KTOS    Other Knee/Hip Stretches  Adductor stretch L 4x30 sec on airex/supine with strap/standing    Other Knee/Hip Stretches  Foam roll L adductor x 3 min      Modalities   Modalities  Vasopneumatic      Vasopneumatic   Number Minutes Vasopneumatic   15 minutes    Vasopnuematic Location   Other (comment) L upper thigh    Vasopneumatic Pressure  Medium    Vasopneumatic Temperature   Lowest temp       Trigger Point Dry Needling - 01/29/18 1502    Consent  Given?  Yes    Muscles Treated Lower Body  Adductor longus/brevius/maximus    Adductor Response  Twitch response elicited;Palpable increased muscle length                PT Long Term Goals - 11/26/17 1404      PT LONG TERM GOAL #1   Title  patient to be independent with advanced HEP    Status  On-going      PT LONG TERM GOAL #2   Title  patient to demonstrate cervical AROM to WNL in all planes without pain production    Status  On-going      PT LONG TERM GOAL #3   Title  patient to report reduction in headache intensity, frequency, and duration by >/= 50%    Status  On-going      PT LONG TERM GOAL #4   Title  patient to report ability to perform work and household activities without increase in neck pain/tightness    Status  On-going            Plan - 01/29/18 1451    Clinical Impression Statement  Patient arrived to session with report that she has recently been experiencing LBP and L LE tightness. Has been  trying to stretch her L adductor- says she experiences burning with this stretch on L, not on R. Patient tolerated adductor stretching in several different positions- not able to find relief. Palpable tightness in L adductor and medial hamstring. Tolerated DN to L adductor by PT Lavina Hamman with good response. Completed session with [place] to L upper thigh to ease symptoms; reported slight improvement in symptoms at end of session.    PT Treatment/Interventions  ADLs/Self Care Home Management;Cryotherapy;Electrical Stimulation;Moist Heat;Traction;Therapeutic exercise;Therapeutic activities;Functional mobility training;Ultrasound;Neuromuscular re-education;Patient/family education;Manual techniques;Vasopneumatic Device;Taping;Dry needling;Passive range of motion    Consulted and Agree with Plan of Care  Patient       Patient will benefit from skilled therapeutic intervention in order to improve the following deficits and impairments:  Pain, Impaired UE functional use, Decreased strength, Decreased activity tolerance  Visit Diagnosis: Cervicalgia  Abnormal posture  Other symptoms and signs involving the musculoskeletal system     Problem List Patient Active Problem List   Diagnosis Date Noted  . Elevated blood pressure reading 10/14/2017  . Nonallopathic lesion of cervical region 10/06/2017  . Nonallopathic lesion of thoracic region 10/06/2017  . Nonallopathic lesion of lumbosacral region 10/06/2017  . Migraine headache 09/17/2017  . Anxiety 09/17/2017  . Neck pain 09/17/2017  . Muscle tightness 09/17/2017  . Palpitation 09/17/2017    Anette Guarneri, PT, DPT 01/29/18 3:09 PM   [facility] Outpatient Rehabilitation [facility] [address]  [address] [place], [place], [address] Phone: [phone]   Fax:  [phone]  Name: Margaret Deleon MRN: [id number] Date of Birth: 1981/03/16

## 2018-02-02 ENCOUNTER — Ambulatory Visit: Admitting: Physical Therapy

## 2018-02-02 ENCOUNTER — Encounter: Payer: Self-pay | Admitting: Physical Therapy

## 2018-02-02 DIAGNOSIS — M542 Cervicalgia: Secondary | ICD-10-CM

## 2018-02-02 DIAGNOSIS — R29898 Other symptoms and signs involving the musculoskeletal system: Secondary | ICD-10-CM

## 2018-02-02 DIAGNOSIS — R293 Abnormal posture: Secondary | ICD-10-CM

## 2018-02-02 NOTE — Progress Notes (Signed)
Margaret Deleon, Margaret Deleon Phone: [phone] Subjective:     CC: Back and neck pain  YQM:[id number]  Margaret Deleon is a 37 y.o. female coming in with complaint of back and neck pain.  Patient has chronic migraines as well as tension headaches.  Attempted osteopathic manipulation which has given her better for 1 to 2 weeks and then that seems to start giving her more frequency of the headaches again.  Patient has started vitamin D supplementation with minimal benefit so far.  Other laboratory work-up for polyarthralgia was fairly unremarkable.  Patient is still concerned because most days of the week continues to have pain.  Sometimes gives her enough pain that stops her from certain activities.  Can make her depressed as well.     Past Medical History:  Diagnosis Date  . Anxiety 09/17/2017  . Depression   . Elevated blood pressure reading 10/14/2017   Elevated blood pressure  . Hypertension   . Migraine headache 09/17/2017   Has been on propranolol and labetalol in the past Did not tolerate Topamax Takes Excedrin Migraine as needed, has not tried has tried Fioricet once  . Migraines   . Muscle tightness 09/17/2017  . Neck pain 09/17/2017  . Nonallopathic lesion of cervical region 10/06/2017  . Nonallopathic lesion of lumbosacral region 10/06/2017  . Nonallopathic lesion of thoracic region 10/06/2017  . Palpitations 09/17/2017   Palpitations   Past Surgical History:  Procedure Laterality Date  . BREAST ENHANCEMENT SURGERY    . CESAREAN SECTION     Social History   Socioeconomic History  . Marital status: Married    Spouse name: Not on file  . Number of children: 3  . Years of education: Not on file  . Highest education level: Not on file  Occupational History  . Not on file  Social Needs  . Financial resource strain: Not on file  . Food insecurity:    Worry: Not on file    Inability: Not on file  . Transportation needs:      Medical: Not on file    Non-medical: Not on file  Tobacco Use  . Smoking status: Never Smoker  . Smokeless tobacco: Never Used  Substance and Sexual Activity  . Alcohol use: Yes    Comment: occasionally   . Drug use: No  . Sexual activity: Yes  Lifestyle  . Physical activity:    Days per week: Not on file    Minutes per session: Not on file  . Stress: Not on file  Relationships  . Social connections:    Talks on phone: Not on file    Gets together: Not on file    Attends religious service: Not on file    Active member of club or organization: Not on file    Attends meetings of clubs or organizations: Not on file    Relationship status: Not on file  Other Topics Concern  . Not on file  Social History Narrative  . Not on file   Allergies  Allergen Reactions  . Codeine Nausea And Vomiting  . Topamax [Topiramate]     Felt weird, high like and tired   Family History  Problem Relation Age of Onset  . Depression Mother   . Cancer Father   . Alcohol abuse Brother   . Depression Brother   . Migraines Sister      Past medical history, social, surgical and family history all reviewed  in electronic medical record.  No pertanent information unless stated regarding to the chief complaint.   Review of Systems:Review of systems updated and as accurate as of 02/03/18  No headache, visual changes, nausea, vomiting, diarrhea, constipation, dizziness, abdominal pain, skin rash, fevers, chills, night sweats, weight loss, swollen lymph nodes,, chest pain, shortness of breath, mood changes.  Positive muscle aches, body aches  Objective  Blood pressure (!) 140/96, pulse 90, height  (1.702 m), weight 174 lb (78.9 kg), last menstrual period 01/13/2018, SpO2 97 %. Systems examined below as of 02/03/18   General: No apparent distress alert and oriented x3 mood and affect normal, dressed appropriately.  HEENT: Pupils equal, extraocular movements intact  Respiratory: Patient's speak  in full sentences and does not appear short of breath  Cardiovascular: No lower extremity edema, non tender, no erythema  Skin: Warm dry intact with no signs of infection or rash on extremities or on axial skeleton.  Abdomen: Soft nontender  Neuro: Cranial nerves II through XII are intact, neurovascularly intact in all extremities with 2+ DTRs and 2+ pulses.  Lymph: No lymphadenopathy of posterior or anterior cervical chain or axillae bilaterally.  Gait normal with good balance and coordination.  MSK:  Non tender with full range of motion and good stability and symmetric strength and tone of shoulders, elbows, wrist, hip, knee and ankles bilaterally.  Neck: Inspection loss of lordosis. No palpable stepoffs. Negative Spurling's maneuver. Limited range of motion in all planes Grip strength and sensation normal in bilateral hands Strength good C4 to T1 distribution No sensory change to C4 to T1 Negative Hoffman sign bilaterally Reflexes normal Tightness in the trapezius is bilaterally.  Back exam shows some tightness with Pearlean Brownie but otherwise fairly unremarkable.  Osteopathic findings C2 flexed rotated and side bent right C7 flexed rotated and side bent left T3 extended rotated and side bent right inhaled third rib T6 extended rotated and side bent left L2 flexed rotated and side bent right Sacrum right on right      Impression and Recommendations:     This case required medical decision making of moderate complexity.      Note: This dictation was prepared with Dragon dictation along with smaller phrase technology. Any transcriptional errors that result from this process are unintentional.

## 2018-02-02 NOTE — Therapy (Signed)
[facility] Outpatient Rehabilitation [facility] [address]  [address] [place], [place], [address] Phone: [phone]   Fax:  [phone]  Physical Therapy Treatment  Patient Details  Name: Margaret Deleon MRN: [id number] Date of Birth: 1981/07/09 Referring Provider: Dr. Cheryll Cockayne   Encounter Date: 02/02/2018  PT End of Session - 02/02/18 1405    Visit Number  13    Number of Visits  20    Date for PT Re-Evaluation  02/23/18    Authorization Type  [employer]    PT Start Time  1402    PT Stop Time  1446    PT Time Calculation (min)  44 min    Activity Tolerance  Patient tolerated treatment well    Behavior During Therapy  [facility] for tasks assessed/performed       Past Medical History:  Diagnosis Date  . Anxiety 09/17/2017  . Depression   . Elevated blood pressure reading 10/14/2017   Elevated blood pressure  . Hypertension   . Migraine headache 09/17/2017   Has been on propranolol and labetalol in the past Did not tolerate Topamax Takes Excedrin Migraine as needed, has not tried has tried Fioricet once  . Migraines   . Muscle tightness 09/17/2017  . Neck pain 09/17/2017  . Nonallopathic lesion of cervical region 10/06/2017  . Nonallopathic lesion of lumbosacral region 10/06/2017  . Nonallopathic lesion of thoracic region 10/06/2017  . Palpitations 09/17/2017   Palpitations    Past Surgical History:  Procedure Laterality Date  . BREAST ENHANCEMENT SURGERY    . CESAREAN SECTION      There were no vitals filed for this visit.  Subjective Assessment - 02/02/18 1404    Subjective  leg is feeling better - some tightness but hip is no longer bothering her and leg isnt cramping    Pertinent History  cluster migraine, intermittent high BP, palpitations    Diagnostic tests  none recently    Patient Stated Goals  improve pain/tightness    Currently in Pain?  Yes    Pain Score  3     Pain Location  Neck    Pain Orientation  Right;Left    Pain Descriptors /  Indicators  Sore    Pain Type  Acute pain;Chronic pain                       [facility] Adult PT Treatment/Exercise - 02/02/18 1406      Neck Exercises: Machines for Strengthening   UBE (Upper Arm Bike)  L3 x 6 min (3/3)      Neck Exercises: Supine   Other Supine Exercise  serratus punch - x 15 reps - 7#      Neck Exercises: Prone   Neck Retraction  15 reps;3 secs prone on elbows      Manual Therapy   Manual Therapy  Joint mobilization;Soft tissue mobilization;Myofascial release    Manual therapy comments  patient supine    Joint Mobilization  Grade III-IV CPAs to cervical spine 3 x 15 sec each segment; alternaitng shoulder depression    Soft tissue mobilization  STM to B upper back, shoulder, and cervical musculature    Myofascial Release  manual trigger point release to B UT, B cervical paraspinals.                  PT Long Term Goals - 11/26/17 1404      PT LONG TERM GOAL #1   Title  patient to be independent with advanced HEP    Status  On-going      PT LONG TERM GOAL #2   Title  patient to demonstrate cervical AROM to WNL in all planes without pain production    Status  On-going      PT LONG TERM GOAL #3   Title  patient to report reduction in headache intensity, frequency, and duration by >/= 50%    Status  On-going      PT LONG TERM GOAL #4   Title  patient to report ability to perform work and household activities without increase in neck pain/tightness    Status  On-going            Plan - 02/02/18 1406    Clinical Impression Statement  Margaret Deleon with subjective reports of improved tissue tightness following last session, as well as absence of HA since returning to PT. Does continue to demonstrate some palpable tissue tightness and TTP at neck and upper back. Patient to follow up with Dr. Katrinka Blazing tomorrow regarding these concerns of overall tightness and limited carryover outside of PT for prolonged periods.     PT Treatment/Interventions   ADLs/Self Care Home Management;Cryotherapy;Electrical Stimulation;Moist Heat;Traction;Therapeutic exercise;Therapeutic activities;Functional mobility training;Ultrasound;Neuromuscular re-education;Patient/family education;Manual techniques;Vasopneumatic Device;Taping;Dry needling;Passive range of motion    Consulted and Agree with Plan of Care  Patient       Patient will benefit from skilled therapeutic intervention in order to improve the following deficits and impairments:  Pain, Impaired UE functional use, Decreased strength, Decreased activity tolerance  Visit Diagnosis: Cervicalgia  Abnormal posture  Other symptoms and signs involving the musculoskeletal system     Problem List Patient Active Problem List   Diagnosis Date Noted  . Elevated blood pressure reading 10/14/2017  . Nonallopathic lesion of cervical region 10/06/2017  . Nonallopathic lesion of thoracic region 10/06/2017  . Nonallopathic lesion of lumbosacral region 10/06/2017  . Migraine headache 09/17/2017  . Anxiety 09/17/2017  . Neck pain 09/17/2017  . Muscle tightness 09/17/2017  . Palpitation 09/17/2017     Kipp Laurence, PT, DPT 02/02/18 2:53 PM   [facility] [address]  [address] [place], [place], [address] Phone: [phone]   Fax:  [phone]  Name: Margaret Deleon MRN: [id number] Date of Birth: 1980-11-24

## 2018-02-03 ENCOUNTER — Encounter: Payer: Self-pay | Admitting: Family Medicine

## 2018-02-03 ENCOUNTER — Ambulatory Visit: Admitting: Cardiology

## 2018-02-03 ENCOUNTER — Ambulatory Visit (INDEPENDENT_AMBULATORY_CARE_PROVIDER_SITE_OTHER): Admitting: Family Medicine

## 2018-02-03 ENCOUNTER — Ambulatory Visit (INDEPENDENT_AMBULATORY_CARE_PROVIDER_SITE_OTHER)
Admission: RE | Admit: 2018-02-03 | Discharge: 2018-02-03 | Disposition: A | Discharge status: Home / Self Care | Source: Ambulatory Visit | Attending: Family Medicine | Admitting: Family Medicine

## 2018-02-03 VITALS — BP 140/96 | HR 90 | Ht 67.0 in | Wt 174.0 lb

## 2018-02-03 DIAGNOSIS — G8929 Other chronic pain: Secondary | ICD-10-CM

## 2018-02-03 DIAGNOSIS — M545 Low back pain: Secondary | ICD-10-CM | POA: Diagnosis not present

## 2018-02-03 DIAGNOSIS — M542 Cervicalgia: Secondary | ICD-10-CM

## 2018-02-03 DIAGNOSIS — M999 Biomechanical lesion, unspecified: Secondary | ICD-10-CM

## 2018-02-03 MED ORDER — VENLAFAXINE HCL ER 37.5 MG PO CP24: 37.5000 mg | ORAL_CAPSULE | Freq: Every day | ORAL | 1 refills | Status: DC

## 2018-02-03 MED ORDER — VITAMIN D (ERGOCALCIFEROL) 1.25 MG (50000 UNIT) PO CAPS: 50000.0000 [IU] | ORAL_CAPSULE | ORAL | 0 refills | Status: DC

## 2018-02-03 NOTE — Patient Instructions (Signed)
Good to see you  Xrays downstairs Effexor 37.5 mg daily- in the long run we will get you off thre zoloft  Once weekly vitmain D for 12 weeks Keep hope, I think we will get there  We will also keep reflux in the back of our minds Duexis 3 times daily for 3 days See me again in 3-4 weeks

## 2018-02-03 NOTE — Assessment & Plan Note (Signed)
Decision today to treat with OMT was based on Physical Exam  After verbal consent patient was treated with HVLA, ME, FPR techniques in cervical, thoracic, lumbar and sacral areas  Patient tolerated the procedure well with improvement in symptoms  Patient given exercises, stretches and lifestyle modifications  See medications in patient instructions if given  Patient will follow up in 4 weeks 

## 2018-02-03 NOTE — Assessment & Plan Note (Signed)
Patient has had chronic low back pain and neck pain for quite some time.  X-ray of the cervical and lumbar spine ordered today.  I do believe that some of the migraine headaches could be secondary to more of that psychosomatic aspect as well.  Encourage patient to try Effexor.  Likely will try this and probably take patient off her Zoloft in the long run.  Patient will follow-up again in 4 weeks

## 2018-02-05 ENCOUNTER — Ambulatory Visit: Admitting: Physical Therapy

## 2018-02-05 ENCOUNTER — Encounter: Payer: Self-pay | Admitting: Physical Therapy

## 2018-02-05 ENCOUNTER — Other Ambulatory Visit: Payer: Self-pay | Admitting: Family Medicine

## 2018-02-05 DIAGNOSIS — M542 Cervicalgia: Secondary | ICD-10-CM | POA: Diagnosis not present

## 2018-02-05 DIAGNOSIS — R29898 Other symptoms and signs involving the musculoskeletal system: Secondary | ICD-10-CM

## 2018-02-05 DIAGNOSIS — R293 Abnormal posture: Secondary | ICD-10-CM

## 2018-02-05 NOTE — Therapy (Signed)
[facility] [address]  [address] [place], [place], [address] Phone: [phone]   Fax:  [phone]  Physical Therapy Treatment  Patient Details  Name: Margaret Deleon MRN: [id number] Date of Birth: [birth year] Referring Provider: Dr. Cheryll Cockayne   Encounter Date: 02/05/2018  PT End of Session - 02/05/18 1403    Visit Number  14    Number of Visits  20    Date for PT Re-Evaluation  02/23/18    Authorization Type  [employer]    PT Start Time  1403    PT Stop Time  1457    PT Time Calculation (min)  54 min    Activity Tolerance  Patient tolerated treatment well    Behavior During Therapy  [facility] for tasks assessed/performed       Past Medical History:  Diagnosis Date  . Anxiety 09/17/2017  . Depression   . Elevated blood pressure reading 10/14/2017   Elevated blood pressure  . Hypertension   . Migraine headache 09/17/2017   Has been on propranolol and labetalol in the past Did not tolerate Topamax Takes Excedrin Migraine as needed, has not tried has tried Fioricet once  . Migraines   . Muscle tightness 09/17/2017  . Neck pain 09/17/2017  . Nonallopathic lesion of cervical region 10/06/2017  . Nonallopathic lesion of lumbosacral region 10/06/2017  . Nonallopathic lesion of thoracic region 10/06/2017  . Palpitations 09/17/2017   Palpitations    Past Surgical History:  Procedure Laterality Date  . BREAST ENHANCEMENT SURGERY    . CESAREAN SECTION      There were no vitals filed for this visit.  Subjective Assessment - 02/05/18 1404    Subjective  Pt saw MD on Tues - x-rays of neck & low back were normal. States MD feels like some of her issues are due to inflammation and has started her on prescription Ibuprofen.    Pertinent History  cluster migraine, intermittent high BP, palpitations    Diagnostic tests  none recently    Patient Stated Goals  improve pain/tightness    Currently in Pain?  Yes    Pain Score  3     Pain  Location  Neck upper & lower back    Pain Orientation  Right;Left    Pain Descriptors / Indicators  Sore;Tightness    Pain Type  Acute pain;Chronic pain                       [facility] Adult PT Treatment/Exercise - 02/05/18 1403      Exercises   Exercises  Neck      Neck Exercises: Machines for Strengthening   UBE (Upper Arm Bike)  L3 x 6 min (3/3)    Lat Pull  20# - 15 reps      Neck Exercises: Theraband   Shoulder External Rotation  15 reps;Green hooklying over pool noodle    Horizontal ABduction  15 reps;Green hooklying on pool noodle    Other Theraband Exercises  Alt shoulder diagonals + scap retraction with red TB hooklying on pool noodle x15      Neck Exercises: Prone   Neck Retraction  10 reps 2 sets    Neck Retraction Limitations  with diagonals    Other Prone Exercise  Prone over edge of mat table - protraction rocking on inverted BOSU x15      Modalities   Modalities  Electrical Stimulation;Moist Heat  Moist Heat Therapy   Number Minutes Moist Heat  15 Minutes    Moist Heat Location  Cervical;Shoulder upper back      Electrical Stimulation   Electrical Stimulation Location  Lower cervical & upper thoracic spine    Electrical Stimulation Action  IFC    Electrical Stimulation Parameters  to tolernace    Electrical Stimulation Goals  Pain;Tone      Manual Therapy   Manual Therapy  Joint mobilization;Soft tissue mobilization;Myofascial release    Manual therapy comments  hooklying on pool noodle    Joint Mobilization  Grade III-IV CPAs to cervical spine 3 x 15 sec each segment    Soft tissue mobilization  STM to B pecs, upper back, shoulder, and cervical musculature    Myofascial Release  manual trigger point release to B UT, B cervical paraspinals, lateral pecs                  PT Long Term Goals - 11/26/17 1404      PT LONG TERM GOAL #1   Title  patient to be independent with advanced HEP    Status  On-going      PT LONG TERM  GOAL #2   Title  patient to demonstrate cervical AROM to WNL in all planes without pain production    Status  On-going      PT LONG TERM GOAL #3   Title  patient to report reduction in headache intensity, frequency, and duration by >/= 50%    Status  On-going      PT LONG TERM GOAL #4   Title  patient to report ability to perform work and household activities without increase in neck pain/tightness    Status  On-going            Plan - 02/05/18 1445    Clinical Impression Statement  Margaret Deleon reporting MD not giving her much new info - states cervical and lumbar x-rays were unremarkable and MD feels that her pain is inflammatory in origin so he wants her to start taking prescription strength ibuprofen. Pt not wanting to have to rely on meds, therefore treatment session continuing to focus on manual therapy as well as self-STM and exercises/stretches to address increased muscle tension. Prior benefit noted from use of estim and moist heat therefore treatment concluded with this and will look into insurance coverage for home TENS/IT for pain and muscle tension management.    PT Treatment/Interventions  ADLs/Self Care Home Management;Cryotherapy;Electrical Stimulation;Moist Heat;Traction;Therapeutic exercise;Therapeutic activities;Functional mobility training;Ultrasound;Neuromuscular re-education;Patient/family education;Manual techniques;Vasopneumatic Device;Taping;Dry needling;Passive range of motion    Consulted and Agree with Plan of Care  Patient       Patient will benefit from skilled therapeutic intervention in order to improve the following deficits and impairments:  Pain, Impaired UE functional use, Decreased strength, Decreased activity tolerance  Visit Diagnosis: Cervicalgia  Abnormal posture  Other symptoms and signs involving the musculoskeletal system     Problem List Patient Active Problem List   Diagnosis Date Noted  . Elevated blood pressure reading 10/14/2017  .  Nonallopathic lesion of cervical region 10/06/2017  . Nonallopathic lesion of thoracic region 10/06/2017  . Nonallopathic lesion of lumbosacral region 10/06/2017  . Migraine headache 09/17/2017  . Anxiety 09/17/2017  . Neck pain 09/17/2017  . Muscle tightness 09/17/2017  . Palpitation 09/17/2017    Marry Guan, PT, MPT 02/05/2018, 4:49 PM  [facility] [place] [address]  [address] [place], [place], [address] Phone: [phone]   Fax:  [phone]  Name: Margaret Deleon MRN: [id number] Date of Birth: 04-Aug-1981

## 2018-02-10 ENCOUNTER — Ambulatory Visit: Admitting: Physical Therapy

## 2018-02-12 ENCOUNTER — Encounter: Payer: Self-pay | Admitting: Physical Therapy

## 2018-02-12 ENCOUNTER — Ambulatory Visit: Admitting: Physical Therapy

## 2018-02-12 DIAGNOSIS — R293 Abnormal posture: Secondary | ICD-10-CM

## 2018-02-12 DIAGNOSIS — M542 Cervicalgia: Secondary | ICD-10-CM

## 2018-02-12 DIAGNOSIS — R29898 Other symptoms and signs involving the musculoskeletal system: Secondary | ICD-10-CM

## 2018-02-12 NOTE — Therapy (Addendum)
[facility] [place] [address]  [place], [place], [address] Phone: [phone]   Fax:  [phone]  Physical Therapy Treatment  Patient Details  Name: Margaret Deleon MRN: [id number] Date of Birth: 04-Dec-1980 Referring Provider: Dr. Billey Gosling   Encounter Date: 02/12/2018  PT End of Session - 02/12/18 1407    Visit Number  15    Number of Visits  20    Date for PT Re-Evaluation  02/23/18    Authorization Type  [employer]    PT Start Time  1401    PT Stop Time  1442    PT Time Calculation (min)  41 min    Activity Tolerance  Patient tolerated treatment well    Behavior During Therapy  [facility] for tasks assessed/performed       Past Medical History:  Diagnosis Date  . Anxiety 09/17/2017  . Depression   . Elevated blood pressure reading 10/14/2017   Elevated blood pressure  . Hypertension   . Migraine headache 09/17/2017   Has been on propranolol and labetalol in the past Did not tolerate Topamax Takes Excedrin Migraine as needed, has not tried has tried Fioricet once  . Migraines   . Muscle tightness 09/17/2017  . Neck pain 09/17/2017  . Nonallopathic lesion of cervical region 10/06/2017  . Nonallopathic lesion of lumbosacral region 10/06/2017  . Nonallopathic lesion of thoracic region 10/06/2017  . Palpitations 09/17/2017   Palpitations    Past Surgical History:  Procedure Laterality Date  . BREAST ENHANCEMENT SURGERY    . CESAREAN SECTION      There were no vitals filed for this visit.  Subjective Assessment - 02/12/18 1403    Subjective  concerned for disc space narrowing in neck on xrays; does not want to take Effexor.     Pertinent History  cluster migraine, intermittent high BP, palpitations    Diagnostic tests  none recently    Patient Stated Goals  improve pain/tightness    Currently in Pain?  Yes    Pain Score  5     Pain Location  Neck + shoulder    Pain Orientation  Left    Pain Descriptors / Indicators   Aching;Cramping;Discomfort    Pain Type  Acute pain;Chronic pain                       [facility] Adult PT Treatment/Exercise - 02/12/18 1407      Neck Exercises: Machines for Strengthening   UBE (Upper Arm Bike)  L3 x 6 min (3/3)      Manual Therapy   Manual Therapy  Joint mobilization;Soft tissue mobilization;Myofascial release    Manual therapy comments  patient prone    Joint Mobilization  Grade II-III CPAs to lower cervical and upper thoracic spine    Soft tissue mobilization  STM to B UT, B LS, B infra, B cervical paraspinals, B suboccipitals    Myofascial Release  manual trigger point release to L UT and L infra       Trigger Point Dry Needling - 02/12/18 1512    Consent Given?  Yes    Muscles Treated Upper Body  Upper trapezius;Infraspinatus L side only    Upper Trapezius Response  Twitch reponse elicited;Palpable increased muscle length    Infraspinatus Response  Twitch response elicited;Palpable increased muscle length                PT Long Term Goals - 11/26/17  [place] #1   Title  patient to be independent with advanced HEP    Status  On-going      PT LONG TERM GOAL #2   Title  patient to demonstrate cervical AROM to WNL in all planes without pain production    Status  On-going      PT LONG TERM GOAL #3   Title  patient to report reduction in headache intensity, frequency, and duration by >/= 50%    Status  On-going      PT LONG TERM GOAL #4   Title  patient to report ability to perform work and household activities without increase in neck pain/tightness    Status  On-going            Plan - 02/12/18 Tucker reporting concerns regarding cervical xrays and MD note saying mild athritis. Today presenitng with primarily L sided upper back and neck pain today with good releif from continued STM and DN treatment to this area. Education to continue to stay active and continue HEP as  currently outline for full benefit and maximum carryover.     PT Treatment/Interventions  ADLs/Self Care Home Management;Cryotherapy;Electrical Stimulation;Moist Heat;Traction;Therapeutic exercise;Therapeutic activities;Functional mobility training;Ultrasound;Neuromuscular re-education;Patient/family education;Manual techniques;Vasopneumatic Device;Taping;Dry needling;Passive range of motion    Consulted and Agree with Plan of Care  Patient       Patient will benefit from skilled therapeutic intervention in order to improve the following deficits and impairments:  Pain, Impaired UE functional use, Decreased strength, Decreased activity tolerance  Visit Diagnosis: Cervicalgia  Abnormal posture  Other symptoms and signs involving the musculoskeletal system     Problem List Patient Active Problem List   Diagnosis Date Noted  . Elevated blood pressure reading 10/14/2017  . Nonallopathic lesion of cervical region 10/06/2017  . Nonallopathic lesion of thoracic region 10/06/2017  . Nonallopathic lesion of lumbosacral region 10/06/2017  . Migraine headache 09/17/2017  . Anxiety 09/17/2017  . Neck pain 09/17/2017  . Muscle tightness 09/17/2017  . Palpitation 09/17/2017    Lanney Gins, PT, DPT 02/12/18 4:45 PM   [facility] [place] [address]  [place], [place], [address] Phone: [phone]   Fax:  [phone]  Name: Saira Kramme MRN: [id number] Date of Birth: [birth year]  PHYSICAL THERAPY DISCHARGE SUMMARY  Visits from Start of Care: 15  Current functional level related to goals / functional outcomes:   Unable to formally assess as pt failed to return for remaining visits in POC.   Remaining deficits:   As above.   Education / Equipment:   HEP  Plan: Patient agrees to discharge.  Patient goals were not met. Patient is being discharged due to not returning since the last visit.  ?????     Percival Spanish, PT, MPT 04/21/18, 4:10 PM  [facility] [address]  [place], [place], [address] Phone: [phone]   Fax:  [phone]

## 2018-02-16 ENCOUNTER — Encounter: Payer: Self-pay | Admitting: Family Medicine

## 2018-02-16 NOTE — Progress Notes (Signed)
Cardiology Office Note:    Date:  02/17/2018   ID:  Margaret Deleon, DOB [birth year], MRN [id number]  PCP:  Pincus Sanes, MD  Cardiologist:  Norman Herrlich, MD    Referring MD: Pincus Sanes, MD    ASSESSMENT:    1. Palpitation    PLAN:    In order of problems listed above:  1. Her event monitor had a mild degree of atrial arrhythmia asymptomatic as well as  Mobitz 1 second-degree AV block which could be seen in young healthy well-trained individuals.  At this time I would not advise antiarrhythmic medications and I asked her to avoid over-the-counter proarrhythmic drugs   Next appointment: As needed   Medication Adjustments/Labs and Tests Ordered: Current medicines are reviewed at length with the patient today.  Concerns regarding medicines are outlined above.  No orders of the defined types were placed in this encounter.  No orders of the defined types were placed in this encounter.   Chief Complaint  Patient presents with  . Follow-up    History of Present Illness:    Margaret Deleon is a 37 y.o. female with a hx of palpitation  last seen 01/12/18.  ASSESSMENT:    1. Palpitation   2. Elevated blood pressure reading    PLAN:    1.    Her symptoms are quite suggestive of ventricular premature beats and will utilize a 2-week event monitor to document arrhythmia.  She will continue her beta-blocker and avoid proarrhythmic medications with her symptoms related to physical exercise echocardiogram be performed to screen for cardiomyopathy.  I asked her to return back to the office in 4 weeks and follow-up after testing is performed. 2. Repeat blood pressure determination in my office at rest is normal.  I do not think she requires any other antihypertensive agent that her beta-blocker that she takes for migraine  Compliance with diet, lifestyle and medications: Yes  She avoids over-the-counter proarrhythmic medications her event monitor showed atrial premature  beats but no complex arrhythmia who is asymptomatic and she had infrequent episodes of physiologic Mobitz 1 second-degree AV block nocturnally.  She still has a sensation of her heart pausing at the beginning and end of exercise.  There is been no syncope Past Medical History:  Diagnosis Date  . Anxiety 09/17/2017  . Depression   . Elevated blood pressure reading 10/14/2017   Elevated blood pressure  . Hypertension   . Migraine headache 09/17/2017   Has been on propranolol and labetalol in the past Did not tolerate Topamax Takes Excedrin Migraine as needed, has not tried has tried Fioricet once  . Migraines   . Muscle tightness 09/17/2017  . Neck pain 09/17/2017  . Nonallopathic lesion of cervical region 10/06/2017  . Nonallopathic lesion of lumbosacral region 10/06/2017  . Nonallopathic lesion of thoracic region 10/06/2017  . Palpitations 09/17/2017   Palpitations    Past Surgical History:  Procedure Laterality Date  . BREAST ENHANCEMENT SURGERY    . CESAREAN SECTION      Current Medications: Current Meds  Medication Sig  . butalbital-acetaminophen-caffeine (FIORICET, ESGIC) 50-325-40 MG tablet Take 1-2 tablets by mouth every 6 (six) hours as needed for headache.  . cyclobenzaprine (FLEXERIL) 10 MG tablet Take 0.5-1 tablets (5-10 mg total) by mouth at bedtime.  . gabapentin (NEURONTIN) 100 MG capsule Take 2 capsules (200 mg total) by mouth at bedtime.  [name] Kitchen LORazepam (ATIVAN) 0.5 MG tablet Take 1 tablet (0.5 mg total) by mouth daily as needed  for anxiety.  . prochlorperazine (COMPAZINE) 10 MG tablet Take 1 tablet (10 mg total) by mouth every 8 (eight) hours as needed for refractory nausea / vomiting (refractory headache).  . propranolol ER (INDERAL LA) 60 MG 24 hr capsule Take 1 capsule (60 mg total) by mouth daily.  . sertraline (ZOLOFT) 50 MG tablet Take 1 tablet (50 mg total) by mouth at bedtime.  [name] Kitchen venlafaxine XR (EFFEXOR XR) 37.5 MG 24 hr capsule Take 1 capsule (37.5 mg total) by mouth daily  with breakfast.  . Vitamin D, Ergocalciferol, (DRISDOL) 50000 units CAPS capsule Take 1 capsule (50,000 Units total) by mouth every 7 (seven) days.     Allergies:   Codeine and Topamax [topiramate]   Social History   Socioeconomic History  . Marital status: Married    Spouse name: Not on file  . Number of children: 3  . Years of education: Not on file  . Highest education level: Not on file  Occupational History  . Not on file  Social Needs  . Financial resource strain: Not on file  . Food insecurity:    Worry: Not on file    Inability: Not on file  . Transportation needs:    Medical: Not on file    Non-medical: Not on file  Tobacco Use  . Smoking status: Never Smoker  . Smokeless tobacco: Never Used  Substance and Sexual Activity  . Alcohol use: Yes    Comment: occasionally   . Drug use: No  . Sexual activity: Yes  Lifestyle  . Physical activity:    Days per week: Not on file    Minutes per session: Not on file  . Stress: Not on file  Relationships  . Social connections:    Talks on phone: Not on file    Gets together: Not on file    Attends religious service: Not on file    Active member of club or organization: Not on file    Attends meetings of clubs or organizations: Not on file    Relationship status: Not on file  Other Topics Concern  . Not on file  Social History Narrative  . Not on file     Family History: The patient's family history includes Alcohol abuse in her brother; Cancer in her father; Depression in her brother and mother; Migraines in her sister. ROS:   Please see the history of present illness.    All other systems reviewed and are negative.  EKGs/Labs/Other Studies Reviewed:    The following studies were reviewed today I reviewed her echo that I felt showed mild MVP Event monitor with < 1% APC and occasional nocturnal [place] Recent Labs: 09/08/2017: BUN 7; Creatinine, Ser 1.00; Potassium 4.5; Sodium 141 10/06/2017: Hemoglobin  13.6; Platelets 260.0; TSH 1.15  Recent Lipid Panel No results found for: CHOL, TRIG, HDL, CHOLHDL, VLDL, LDLCALC, LDLDIRECT  Physical Exam:    VS:  BP (!) 142/92 (BP Location: Right Arm, Patient Position: Sitting, Cuff Size: Normal)   Pulse 66   Ht 5\' 7"  (1.702 m)   Wt 171 lb 6.4 oz (77.7 kg)   SpO2 99%   BMI 26.85 kg/m     Wt Readings from Last 3 Encounters:  02/17/18 171 lb 6.4 oz (77.7 kg)  02/03/18 174 lb (78.9 kg)  12/23/17 170 lb 1.9 oz (77.2 kg)     GEN:  Well nourished, well developed in no acute distress HEENT: Normal NECK: No JVD; No carotid bruits LYMPHATICS: No lymphadenopathy CARDIAC:  RRR, no murmurs, rubs, gallops RESPIRATORY:  Clear to auscultation without rales, wheezing or rhonchi  ABDOMEN: Soft, non-tender, non-distended MUSCULOSKELETAL:  No edema; No deformity  SKIN: Warm and dry NEUROLOGIC:  Alert and oriented x 3 PSYCHIATRIC:  Normal affect    Signed, Norman HerrlichBrian Munley, MD  02/17/2018 5:01 PM    [facility]

## 2018-02-17 ENCOUNTER — Ambulatory Visit (INDEPENDENT_AMBULATORY_CARE_PROVIDER_SITE_OTHER): Admitting: Cardiology

## 2018-02-17 VITALS — BP 142/92 | HR 66 | Ht 67.0 in | Wt 171.4 lb

## 2018-02-17 DIAGNOSIS — R002 Palpitations: Secondary | ICD-10-CM | POA: Diagnosis not present

## 2018-02-17 NOTE — Patient Instructions (Addendum)
Medication Instructions:  Your physician recommends that you continue on your current medications as directed. Please refer to the Current Medication list given to you today.  Labwork: None  Testing/Procedures: None  Follow-Up: Your physician recommends that you schedule a follow-up appointment as needed if symptoms worsen or fail to improve.  Any Other Special Instructions Will Be Listed Below (If Applicable).     If you need a refill on your cardiac medications before your next appointment, please call your pharmacy.    1. Avoid all over-the-counter antihistamines except Claritin/Loratadine and Zyrtec/Cetrizine. 2. Avoid all combination including cold sinus allergies flu decongestant and sleep medications 3. You can use Robitussin DM Mucinex and Mucinex DM for cough. 4. can use Tylenol aspirin ibuprofen and naproxen but no combinations such as sleep or sinus. 

## 2018-03-02 NOTE — Progress Notes (Signed)
[name] [address] [place], [place] [address] Phone: [phone] Subjective:    CC: Back pain  BJY:[id number]  Margaret Deleon is a 37 y.o. female coming in with complaint of back pain. No new pain. Overall just tightness and soreness. Has responded OMT before. Has moved.  Unable to tolerate effexor      Past Medical History:  Diagnosis Date  . Anxiety 09/17/2017  . Depression   . Elevated blood pressure reading 10/14/2017   Elevated blood pressure  . Hypertension   . Migraine headache 09/17/2017   Has been on propranolol and labetalol in the past Did not tolerate Topamax Takes Excedrin Migraine as needed, has not tried has tried Fioricet once  . Migraines   . Muscle tightness 09/17/2017  . Neck pain 09/17/2017  . Nonallopathic lesion of cervical region 10/06/2017  . Nonallopathic lesion of lumbosacral region 10/06/2017  . Nonallopathic lesion of thoracic region 10/06/2017  . Palpitations 09/17/2017   Palpitations   Past Surgical History:  Procedure Laterality Date  . BREAST ENHANCEMENT SURGERY    . CESAREAN SECTION     Social History   Socioeconomic History  . Marital status: Married    Spouse name: Not on file  . Number of children: 3  . Years of education: Not on file  . Highest education level: Not on file  Occupational History  . Not on file  Social Needs  . Financial resource strain: Not on file  . Food insecurity:    Worry: Not on file    Inability: Not on file  . Transportation needs:    Medical: Not on file    Non-medical: Not on file  Tobacco Use  . Smoking status: Never Smoker  . Smokeless tobacco: Never Used  Substance and Sexual Activity  . Alcohol use: Yes    Comment: occasionally   . Drug use: No  . Sexual activity: Yes  Lifestyle  . Physical activity:    Days per week: Not on file    Minutes per session: Not on file  . Stress: Not on file  Relationships  . Social connections:    Talks on phone: Not on file   Gets together: Not on file    Attends religious service: Not on file    Active member of club or organization: Not on file    Attends meetings of clubs or organizations: Not on file    Relationship status: Not on file  Other Topics Concern  . Not on file  Social History Narrative  . Not on file   Allergies  Allergen Reactions  . Codeine Nausea And Vomiting  . Topamax [Topiramate]     Felt weird, high like and tired   Family History  Problem Relation Age of Onset  . Depression Mother   . Cancer Father   . Alcohol abuse Brother   . Depression Brother   . Migraines Sister      Past medical history, social, surgical and family history all reviewed in electronic medical record.  No pertanent information unless stated regarding to the chief complaint.   Review of Systems:Review of systems updated and as accurate as of 03/03/18  No headache, visual changes, nausea, vomiting, diarrhea, constipation, dizziness, abdominal pain, skin rash, fevers, chills, night sweats, weight loss, swollen lymph nodes, body aches, joint swelling,  chest pain, shortness of breath, mood changes.  Positive muscle aches  Objective  Blood pressure 114/86, pulse 76, height 5\' 7"  (1.702 m),  weight 171 lb (77.6 kg), SpO2 97 %. Systems examined below as of 03/03/18   General: No apparent distress alert and oriented x3 mood and affect normal, dressed appropriately.  HEENT: Pupils equal, extraocular movements intact  Respiratory: Patient's speak in full sentences and does not appear short of breath  Cardiovascular: No lower extremity edema, non tender, no erythema  Skin: Warm dry intact with no signs of infection or rash on extremities or on axial skeleton.  Abdomen: Soft nontender  Neuro: Cranial nerves II through XII are intact, neurovascularly intact in all extremities with 2+ DTRs and 2+ pulses.  Lymph: No lymphadenopathy of posterior or anterior cervical chain or axillae bilaterally.  Gait normal with good  balance and coordination.  MSK:  Non tender with full range of motion and good stability and symmetric strength and tone of shoulders, elbows, wrist, hip, knee and ankles bilaterally.   Neck: Inspection loss of lordosis. No palpable stepoffs. Negative Spurling's maneuver. Grip strength and sensation normal in bilateral hands Strength good C4 to T1 distribution No sensory change to C4 to T1 Negative Hoffman sign bilaterally Reflexes normal Tightness of the trapezius bilaterally  Backexam shows some mild loss of lordosis.  Mild positive Faber bilaterally.  Negative straight leg test  Osteopathic findings  C4 flexed rotated and side bent left C6 flexed rotated and side bent left T3 extended rotated and side bent right inhaled third rib L4 flexed rotated and side bent right Sacrum right on right     Impression and Recommendations:     This case required medical decision making of moderate complexity.      Note: This dictation was prepared with Dragon dictation along with smaller phrase technology. Any transcriptional errors that result from this process are unintentional.

## 2018-03-03 ENCOUNTER — Ambulatory Visit (INDEPENDENT_AMBULATORY_CARE_PROVIDER_SITE_OTHER): Admitting: Family Medicine

## 2018-03-03 ENCOUNTER — Encounter: Payer: Self-pay | Admitting: Family Medicine

## 2018-03-03 VITALS — BP 114/86 | HR 76 | Ht 67.0 in | Wt 171.0 lb

## 2018-03-03 DIAGNOSIS — M542 Cervicalgia: Secondary | ICD-10-CM | POA: Diagnosis not present

## 2018-03-03 DIAGNOSIS — M999 Biomechanical lesion, unspecified: Secondary | ICD-10-CM

## 2018-03-03 MED ORDER — HYDROXYZINE HCL 10 MG PO TABS: 10.0000 mg | ORAL_TABLET | Freq: Three times a day (TID) | ORAL | 0 refills | Status: DC | PRN

## 2018-03-03 NOTE — Assessment & Plan Note (Signed)
Decision today to treat with OMT was based on Physical Exam  After verbal consent patient was treated with HVLA, ME, FPR techniques in cervical, thoracic, lumbar and sacral areas  Patient tolerated the procedure well with improvement in symptoms  Patient given exercises, stretches and lifestyle modifications  See medications in patient instructions if given  Patient will follow up in 4 weeks 

## 2018-03-03 NOTE — Assessment & Plan Note (Signed)
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  I do believe that this is also where patient holds her stress.  Unable to tolerate the Effexor discussed icing regimen and topical anti-inflammatories.  Patient will follow-up with patient 4 weeks

## 2018-03-03 NOTE — Patient Instructions (Signed)
Good to see you  Margaret Deleon is your friend.  Stay active I am here if you need me Hydroxyzine up to 3 times a day  See me again in 4 weeks

## 2018-03-21 ENCOUNTER — Other Ambulatory Visit: Payer: Self-pay | Admitting: Internal Medicine

## 2018-03-30 NOTE — Progress Notes (Signed)
[facility] Detav[facility] D[facility] [address] [place], [place] [address] Phone: [phone] Subjective:    I'm seeing this patient by the request  of:    CC: Neck pain and back pain  BJY:NWGNFAOZHYHPI:Subjective  Margaret Deleon is a 37 y.o. female coming in with complaint of neck and back pain.  Has responded fairly well to osteopathic manipulation.  Patient has had migraines and states that these events seem to be less at the moment.  Patient was unable to tolerate the Effexor and did not want to try any other medications.  Patient states that she is constantly though does have discomfort.  Never without some.  Patient has noticed that stress does seem to be associated with worsening pain.     Past Medical History:  Diagnosis Date  . Anxiety 09/17/2017  . Depression   . Elevated blood pressure reading 10/14/2017   Elevated blood pressure  . Hypertension   . Migraine headache 09/17/2017   Has been on propranolol and labetalol in the past Did not tolerate Topamax Takes Excedrin Migraine as needed, has not tried has tried Fioricet once  . Migraines   . Muscle tightness 09/17/2017  . Neck pain 09/17/2017  . Nonallopathic lesion of cervical region 10/06/2017  . Nonallopathic lesion of lumbosacral region 10/06/2017  . Nonallopathic lesion of thoracic region 10/06/2017  . Palpitations 09/17/2017   Palpitations   Past Surgical History:  Procedure Laterality Date  . BREAST ENHANCEMENT SURGERY    . CESAREAN SECTION     Social History   Socioeconomic History  . Marital status: Married    Spouse name: Not on file  . Number of children: 3  . Years of education: Not on file  . Highest education level: Not on file  Occupational History  . Not on file  Social Needs  . Financial resource strain: Not on file  . Food insecurity:    Worry: Not on file    Inability: Not on file  . Transportation needs:    Medical: Not on file    Non-medical: Not on file  Tobacco Use  . Smoking status: Never  Smoker  . Smokeless tobacco: Never Used  Substance and Sexual Activity  . Alcohol use: Yes    Comment: occasionally   . Drug use: No  . Sexual activity: Yes  Lifestyle  . Physical activity:    Days per week: Not on file    Minutes per session: Not on file  . Stress: Not on file  Relationships  . Social connections:    Talks on phone: Not on file    Gets together: Not on file    Attends religious service: Not on file    Active member of club or organization: Not on file    Attends meetings of clubs or organizations: Not on file    Relationship status: Not on file  Other Topics Concern  . Not on file  Social History Narrative  . Not on file   Allergies  Allergen Reactions  . Codeine Nausea And Vomiting  . Topamax [Topiramate]     Felt weird, high like and tired   Family History  Problem Relation Age of Onset  . Depression Mother   . Cancer Father   . Alcohol abuse Brother   . Depression Brother   . Migraines Sister      Past medical history, social, surgical and family history all reviewed in electronic medical record.  No pertanent information unless stated regarding  to the chief complaint.   Review of Systems:Review of systems updated   No  visual changes, nausea, vomiting, diarrhea, constipation, dizziness, abdominal pain, skin rash, fevers, chills, night sweats, weight loss, swollen lymph nodes, body aches, joint swelling,  chest pain, shortness of breath, mood changes.  Positive headache and muscle aches  Objective  Blood pressure 100/80, pulse 70, height 5\' 7"  (1.702 m), weight 173 lb (78.5 kg), SpO2 98 %.   General: No apparent distress alert and oriented x3 mood and affect normal, dressed appropriately.  HEENT: Pupils equal, extraocular movements intact  Respiratory: Patient's speak in full sentences and does not appear short of breath  Cardiovascular: No lower extremity edema, non tender, no erythema  Skin: Warm dry intact with no signs of infection or rash  on extremities or on axial skeleton.  Abdomen: Soft nontender  Neuro: Cranial nerves II through XII are intact, neurovascularly intact in all extremities with 2+ DTRs and 2+ pulses.  Lymph: No lymphadenopathy of posterior or anterior cervical chain or axillae bilaterally.  Gait normal with good balance and coordination.  MSK: Mild tender with full range of motion and good stability and symmetric strength and tone of shoulders, elbows, wrist, hip, knee and ankles bilaterally.  Neck: Inspection mild loss of lordosis. No palpable stepoffs. Negative Spurling's maneuver. Limited range of motion lacking sidebending bilaterally. Grip strength and sensation normal in bilateral hands Strength good C4 to T1 distribution No sensory change to C4 to T1 Negative Hoffman sign bilaterally Reflexes normal Tightness of the trapezius bilaterally  Back Exam:  Inspection: Unremarkable  Motion: Flexion 45 deg, Extension 25 deg, Side Bending to 45 deg bilaterally,  Rotation to 45 deg bilaterally  SLR laying: Negative  XSLR laying: Negative  Palpable tenderness: Mild tenderness of the right sacroiliac joint. FABER: Positive right. Sensory change: Gross sensation intact to all lumbar and sacral dermatomes.  Reflexes: 2+ at both patellar tendons, 2+ at achilles tendons, Babinski's downgoing.  Strength at foot  Plantar-flexion: 5/5 Dorsi-flexion: 5/5 Eversion: 5/5 Inversion: 5/5  Leg strength  Quad: 5/5 Hamstring: 5/5 Hip flexor: 5/5 Hip abductors: 5/5  Gait unremarkable.    Osteopathic findings C2 flexed rotated and side bent right C4 flexed rotated and side bent left C7 flexed rotated and side bent left T3 extended rotated and side bent right inhaled third rib T6 extended rotated and side bent left L3 flexed rotated and side bent right Sacrum right on right  Impression and Recommendations:     This case required medical decision making of moderate complexity.      Note: This dictation was  prepared with Dragon dictation along with smaller phrase technology. Any transcriptional errors that result from this process are unintentional.

## 2018-03-31 ENCOUNTER — Ambulatory Visit (INDEPENDENT_AMBULATORY_CARE_PROVIDER_SITE_OTHER): Admitting: Family Medicine

## 2018-03-31 ENCOUNTER — Encounter: Payer: Self-pay | Admitting: Family Medicine

## 2018-03-31 VITALS — BP 100/80 | HR 70 | Ht 67.0 in | Wt 173.0 lb

## 2018-03-31 DIAGNOSIS — M542 Cervicalgia: Secondary | ICD-10-CM | POA: Diagnosis not present

## 2018-03-31 DIAGNOSIS — M999 Biomechanical lesion, unspecified: Secondary | ICD-10-CM

## 2018-03-31 NOTE — Patient Instructions (Signed)
Good to see you  Ice 20 minutes 2 times daily. Usually after activity and before bed. On wall with heels, butt shoulder and head touching for a goal of 5 minutes daily  See em again in 4 weeks

## 2018-04-01 DIAGNOSIS — M999 Biomechanical lesion, unspecified: Secondary | ICD-10-CM | POA: Insufficient documentation

## 2018-04-01 NOTE — Assessment & Plan Note (Signed)
Decision today to treat with OMT was based on Physical Exam  After verbal consent patient was treated with HVLA, ME, FPR techniques in cervical, thoracic, rib, lumbar and sacral areas  Patient tolerated the procedure well with improvement in symptoms  Patient given exercises, stretches and lifestyle modifications  See medications in patient instructions if given  Patient will follow up in 4-6 weeks 

## 2018-04-01 NOTE — Assessment & Plan Note (Signed)
Multifactorial.  Responds well to manipulation.  Discussed icing regimen, encourage patient to take the gabapentin on a regular basis, continue the once weekly vitamin D.  Follow-up again with me 4 to 6 weeks

## 2018-04-10 ENCOUNTER — Other Ambulatory Visit: Payer: Self-pay | Admitting: Internal Medicine

## 2018-04-13 NOTE — Telephone Encounter (Signed)
[place] Controlled Substance Database checked. Last filled on 12/09/17 

## 2018-04-24 ENCOUNTER — Other Ambulatory Visit: Payer: Self-pay | Admitting: Internal Medicine

## 2018-04-27 NOTE — Progress Notes (Signed)
[facility] Kartel Wolbert D.O[facility] [address] [place], [place] [address] Phone: [phone] Subjective:     CC: Neck pain  EXB:[id number]:Subjective  Margaret Deleon is a 37 y.o. female coming in with complaint of neck pain. States that the neck is sore. Has been having right low back pain. Sacral joint. Has been running 6 days a week.        Past Medical History:  Diagnosis Date  . Anxiety 09/17/2017  . Depression   . Elevated blood pressure reading 10/14/2017   Elevated blood pressure  . Hypertension   . Migraine headache 09/17/2017   Has been on propranolol and labetalol in the past Did not tolerate Topamax Takes Excedrin Migraine as needed, has not tried has tried Fioricet once  . Migraines   . Muscle tightness 09/17/2017  . Neck pain 09/17/2017  . Nonallopathic lesion of cervical region 10/06/2017  . Nonallopathic lesion of lumbosacral region 10/06/2017  . Nonallopathic lesion of thoracic region 10/06/2017  . Palpitations 09/17/2017   Palpitations   Past Surgical History:  Procedure Laterality Date  . BREAST ENHANCEMENT SURGERY    . CESAREAN SECTION     Social History   Socioeconomic History  . Marital status: Married    Spouse name: Not on file  . Number of children: 3  . Years of education: Not on file  . Highest education level: Not on file  Occupational History  . Not on file  Social Needs  . Financial resource strain: Not on file  . Food insecurity:    Worry: Not on file    Inability: Not on file  . Transportation needs:    Medical: Not on file    Non-medical: Not on file  Tobacco Use  . Smoking status: Never Smoker  . Smokeless tobacco: Never Used  Substance and Sexual Activity  . Alcohol use: Yes    Comment: occasionally   . Drug use: No  . Sexual activity: Yes  Lifestyle  . Physical activity:    Days per week: Not on file    Minutes per session: Not on file  . Stress: Not on file  Relationships  . Social connections:    Talks on phone: Not on file     Gets together: Not on file    Attends religious service: Not on file    Active member of club or organization: Not on file    Attends meetings of clubs or organizations: Not on file    Relationship status: Not on file  Other Topics Concern  . Not on file  Social History Narrative  . Not on file   Allergies  Allergen Reactions  . Codeine Nausea And Vomiting  . Topamax [Topiramate]     Felt weird, high like and tired   Family History  Problem Relation Age of Onset  . Depression Mother   . Cancer Father   . Alcohol abuse Brother   . Depression Brother   . Migraines Sister      Past medical history, social, surgical and family history all reviewed in electronic medical record.  No pertanent information unless stated regarding to the chief complaint.   Review of Systems:Review of systems updated and as accurate as of 04/28/18  No visual changes, nausea, vomiting, diarrhea, constipation dizziness, abdominal pain, skin rash, fevers, chills, night sweats, weight loss, swollen lymph nodes, body aches, joint swelling, chest pain, shortness of breath, mood changes.  Positive muscle aches and headaches  Objective  Blood pressure  110/70, pulse 71, height 5\' 7"  (1.702 m), weight 174 lb (78.9 kg), SpO2 98 %. Systems examined below as of 04/28/18   General: No apparent distress alert and oriented x3 mood and affect normal, dressed appropriately.  HEENT: Pupils equal, extraocular movements intact  Respiratory: Patient's speak in full sentences and does not appear short of breath  Cardiovascular: No lower extremity edema, non tender, no erythema  Skin: Warm dry intact with no signs of infection or rash on extremities or on axial skeleton.  Abdomen: Soft nontender  Neuro: Cranial nerves II through XII are intact, neurovascularly intact in all extremities with 2+ DTRs and 2+ pulses.  Lymph: No lymphadenopathy of posterior or anterior cervical chain or axillae bilaterally.  Gait normal with  good balance and coordination.  MSK:  Non tender with full range of motion and good stability and symmetric strength and tone of shoulders, elbows, wrist, hip, knee and ankles bilaterally.  Neck: Inspection mild loss of lordosis. No palpable stepoffs. Negative Spurling's maneuver. Full neck range of motion Grip strength and sensation normal in bilateral hands Strength good C4 to T1 distribution No sensory change to C4 to T1 Negative Hoffman sign bilaterally Reflexes normal Tightness of the trapezius.  Osteopathic findings C2 flexed rotated and side bent left C4 flexed rotated and side bent right  C6 flexed rotated and side bent right  T3 extended rotated and side bent right inhaled third rib T5 extended rotated and side bent left L2 flexed rotated and side bent left Sacrum right on right      Impression and Recommendations:     This case required medical decision making of moderate complexity.      Note: This dictation was prepared with Dragon dictation along with smaller phrase technology. Any transcriptional errors that result from this process are unintentional.

## 2018-04-28 ENCOUNTER — Encounter: Payer: Self-pay | Admitting: Family Medicine

## 2018-04-28 ENCOUNTER — Ambulatory Visit (INDEPENDENT_AMBULATORY_CARE_PROVIDER_SITE_OTHER): Admitting: Family Medicine

## 2018-04-28 VITALS — BP 110/70 | HR 71 | Ht 67.0 in | Wt 174.0 lb

## 2018-04-28 DIAGNOSIS — M999 Biomechanical lesion, unspecified: Secondary | ICD-10-CM | POA: Diagnosis not present

## 2018-04-28 DIAGNOSIS — M542 Cervicalgia: Secondary | ICD-10-CM | POA: Diagnosis not present

## 2018-04-28 NOTE — Patient Instructions (Signed)
Good to see you  Ice is your friend  Exercises 3 times a week.  Keep running, I think it is good.  See me again in 1-2 months

## 2018-04-28 NOTE — Assessment & Plan Note (Signed)
Decision today to treat with OMT was based on Physical Exam  After verbal consent patient was treated with HVLA, ME, FPR techniques in cervical, thoracic, rib,  lumbar and sacral areas  Patient tolerated the procedure well with improvement in symptoms  Patient given exercises, stretches and lifestyle modifications  See medications in patient instructions if given  Patient will follow up in 4-8 weeks 

## 2018-04-28 NOTE — Assessment & Plan Note (Signed)
Neck pain noted.  Discussed icing regimen and home exercise.  Discussed which activities of doing which wants to avoid.  Patient needs to continue to work on posture and ergonomics.  Has been responding somewhat to the gabapentin.  Patient will follow-up with me again in 1 to 2 months

## 2018-05-11 ENCOUNTER — Other Ambulatory Visit: Payer: Self-pay | Admitting: Family Medicine

## 2018-06-10 ENCOUNTER — Ambulatory Visit (INDEPENDENT_AMBULATORY_CARE_PROVIDER_SITE_OTHER): Admitting: Internal Medicine

## 2018-06-10 ENCOUNTER — Encounter: Payer: Self-pay | Admitting: Internal Medicine

## 2018-06-10 VITALS — BP 124/86 | HR 55 | Temp 98.8°F | Resp 16 | Ht 67.0 in | Wt 175.8 lb

## 2018-06-10 DIAGNOSIS — R002 Palpitations: Secondary | ICD-10-CM | POA: Diagnosis not present

## 2018-06-10 DIAGNOSIS — R5383 Other fatigue: Secondary | ICD-10-CM | POA: Insufficient documentation

## 2018-06-10 DIAGNOSIS — Z23 Encounter for immunization: Secondary | ICD-10-CM

## 2018-06-10 DIAGNOSIS — G43009 Migraine without aura, not intractable, without status migrainosus: Secondary | ICD-10-CM | POA: Diagnosis not present

## 2018-06-10 DIAGNOSIS — F419 Anxiety disorder, unspecified: Secondary | ICD-10-CM

## 2018-06-10 DIAGNOSIS — E559 Vitamin D deficiency, unspecified: Secondary | ICD-10-CM

## 2018-06-10 DIAGNOSIS — M6289 Other specified disorders of muscle: Secondary | ICD-10-CM

## 2018-06-10 MED ORDER — CYCLOBENZAPRINE HCL 10 MG PO TABS: 5.0000 mg | ORAL_TABLET | Freq: Every day | ORAL | 3 refills | Status: DC

## 2018-06-10 NOTE — Progress Notes (Signed)
Subjective:    Patient ID: Margaret Deleon, female    DOB: 12/19/1980, 37 y.o.   MRN: [id number]  HPI The patient is here for follow up.  Migraine headaches:  She has about one migraine a week.  She takes the propranolol daily and Excedrin Migraine as soon as she thinks she has a migraine because if she waits too long it is much harder to control.  She uses the antinausea medication as needed and the Fioricet as needed.  Anxiety: She is taking her sertraline daily as prescribed. She takes ativan as needed.  She is experiencing nausea and is unsure if it is related to the sertraline or something else.  She did not have the nausea when she first started the medication, but has been having nausea daily for a while. She feels her anxiety is well controlled and she is happy with her current dose of medication.      Weight gain: She runs 4-6 days a week.  She feels she is eating less than she used to eat-partially because of the nausea.  Fatigue: She always feels tired.  She wakes up 3-4 times a night, but can get back to sleep.  She sleeps probably 5-6 hrs a night.  She was unsure if the sertraline was contributing or if there was some other cause for the fatigue.  Vitamin D deficiency: She did take high-dose vitamin D when prescribed by Dr. Katrinka Blazing.  She has not been taking vitamin D on a daily basis since then.  Palpitations: She is taking propranolol daily.  She states palpitations have improved and she has not had them recently.  Chronic muscle tightness in the neck and upper back: She is chronic tightness in her upper back and neck.  She takes gabapentin and Flexeril as needed.  She has done physical therapy in the past and has seen Dr. Katrinka Blazing.  Medications and allergies reviewed with patient and updated if appropriate.  Patient Active Problem List   Diagnosis Date Noted  . Nonallopathic lesion of rib cage 04/01/2018  . Nonallopathic lesion of sacral region 04/01/2018  . Nonallopathic  lesion of cervical region 10/06/2017  . Nonallopathic lesion of thoracic region 10/06/2017  . Nonallopathic lesion of lumbosacral region 10/06/2017  . Migraine headache 09/17/2017  . Anxiety 09/17/2017  . Neck pain 09/17/2017  . Muscle tightness 09/17/2017  . Palpitation 09/17/2017    Current Outpatient Medications on File Prior to Visit  Medication Sig Dispense Refill  . butalbital-acetaminophen-caffeine (FIORICET, ESGIC) 50-325-40 MG tablet Take 1-2 tablets by mouth every 6 (six) hours as needed for headache. 20 tablet 0  . gabapentin (NEURONTIN) 100 MG capsule Take 2 capsules (200 mg total) by mouth at bedtime. 60 capsule 3  . hydrOXYzine (ATARAX/VISTARIL) 10 MG tablet Take 1 tablet (10 mg total) by mouth 3 (three) times daily as needed. 30 tablet 0  . LORazepam (ATIVAN) 0.5 MG tablet TAKE 1 TABLET(0.5 MG) BY MOUTH DAILY AS NEEDED FOR ANXIETY 30 tablet 0  . prochlorperazine (COMPAZINE) 10 MG tablet Take 1 tablet (10 mg total) by mouth every 8 (eight) hours as needed for refractory nausea / vomiting (refractory headache). 15 tablet 0  . propranolol ER (INDERAL LA) 60 MG 24 hr capsule TAKE 1 CAPSULE(60 MG) BY MOUTH DAILY 30 capsule 5  . sertraline (ZOLOFT) 50 MG tablet TAKE 1 TABLET(50 MG) BY MOUTH AT BEDTIME 30 tablet 2  . Vitamin D, Ergocalciferol, (DRISDOL) 50000 units CAPS capsule TAKE 1 CAPSULE BY MOUTH EVERY  7 DAYS 12 capsule 0   No current facility-administered medications on file prior to visit.     Past Medical History:  Diagnosis Date  . Anxiety 09/17/2017  . Depression   . Elevated blood pressure reading 10/14/2017   Elevated blood pressure  . Hypertension   . Migraine headache 09/17/2017   Has been on propranolol and labetalol in the past Did not tolerate Topamax Takes Excedrin Migraine as needed, has not tried has tried Fioricet once  . Migraines   . Muscle tightness 09/17/2017  . Neck pain 09/17/2017  . Nonallopathic lesion of cervical region 10/06/2017  . Nonallopathic  lesion of lumbosacral region 10/06/2017  . Nonallopathic lesion of thoracic region 10/06/2017  . Palpitations 09/17/2017   Palpitations    Past Surgical History:  Procedure Laterality Date  . BREAST ENHANCEMENT SURGERY    . CESAREAN SECTION      Social History   Socioeconomic History  . Marital status: Married    Spouse name: Not on file  . Number of children: 3  . Years of education: Not on file  . Highest education level: Not on file  Occupational History  . Not on file  Social Needs  . Financial resource strain: Not on file  . Food insecurity:    Worry: Not on file    Inability: Not on file  . Transportation needs:    Medical: Not on file    Non-medical: Not on file  Tobacco Use  . Smoking status: Never Smoker  . Smokeless tobacco: Never Used  Substance and Sexual Activity  . Alcohol use: Yes    Comment: occasionally   . Drug use: No  . Sexual activity: Yes  Lifestyle  . Physical activity:    Days per week: Not on file    Minutes per session: Not on file  . Stress: Not on file  Relationships  . Social connections:    Talks on phone: Not on file    Gets together: Not on file    Attends religious service: Not on file    Active member of club or organization: Not on file    Attends meetings of clubs or organizations: Not on file    Relationship status: Not on file  Other Topics Concern  . Not on file  Social History Narrative  . Not on file    Family History  Problem Relation Age of Onset  . Depression Mother   . Cancer Father   . Alcohol abuse Brother   . Depression Brother   . Migraines Sister     Review of Systems  Constitutional: Negative for chills and fever.  Respiratory: Negative for cough, shortness of breath and wheezing.   Cardiovascular: Negative for chest pain and palpitations.  Gastrointestinal: Positive for nausea.       No gerd  Neurological: Positive for light-headedness (sometimes) and headaches (1 migraine a week).         Objective:   Vitals:   06/10/18 1402  BP: 124/86  Pulse: (!) 55  Resp: 16  Temp: 98.8 F (37.1 C)  SpO2: 98%   BP Readings from Last 3 Encounters:  06/10/18 124/86  04/28/18 110/70  03/31/18 100/80   Wt Readings from Last 3 Encounters:  06/10/18 175 lb 12.8 oz (79.7 kg)  04/28/18 174 lb (78.9 kg)  03/31/18 173 lb (78.5 kg)   Body mass index is 27.53 kg/m.   Physical Exam    Constitutional: Appears well-developed and well-nourished. No distress.  HENT:  Head: Normocephalic and atraumatic.  Neck: Neck supple. No tracheal deviation present. No thyromegaly present.  No cervical lymphadenopathy Cardiovascular: Normal rate, regular rhythm and normal heart sounds.   No murmur heard. No carotid bruit .  No edema Pulmonary/Chest: Effort normal and breath sounds normal. No respiratory distress. No has no wheezes. No rales.  Skin: Skin is warm and dry. Not diaphoretic.  Psychiatric: Normal mood and affect. Behavior is normal.      Assessment & Plan:    See Problem List for Assessment and Plan of chronic medical problems.

## 2018-06-10 NOTE — Assessment & Plan Note (Addendum)
Has been on high-dose vitamin D Recommended 2000 units of vitamin D daily Check vitamin D level

## 2018-06-10 NOTE — Assessment & Plan Note (Signed)
Controlled, stable The sertraline is working well and she is happy with her current dose She is experiencing some nausea and fatigue and is unsure if it side effects from the medication or something else Discussed that we could try changing the medication-she will think about this She is taking lorazepam as needed  For now continue above regimen

## 2018-06-10 NOTE — Assessment & Plan Note (Signed)
Has migraines about once a week Taking propranolol daily Takes Excedrin Migraine as needed Rarely takes Fioricet Takes Compazine as needed

## 2018-06-10 NOTE — Assessment & Plan Note (Signed)
Having some chronic fatigue She is only sleeping 5 to 6 hours a night-discussed that this is not enough sleep and ideally she needs to increase the amount that she is sleeping We will check blood work to rule out other causes-CBC, CMP, TSH Stressed taking vitamin D on a daily basis to maintain a normal level Discussed that sertraline could be related, but the only way we would know for sure is to try changing this-she will consider Does not take gabapentin or Flexeril on a regular basis so this is not contributing Continue regular exercise-rides 4-6 days a week

## 2018-06-10 NOTE — Patient Instructions (Signed)
  Tests ordered today. Your results will be released to MyChart (or called to you) after review, usually within 72hours after test completion. If any changes need to be made, you will be notified at that same time.  Flu immunization administered today.    Medications reviewed and updated.  Changes include :   Start vitamin D 2000 units daily  Your prescription(s) have been submitted to your pharmacy. Please take as directed and contact our office if you believe you are having problem(s) with the medication(s).   Please followup in 6 months

## 2018-06-10 NOTE — Assessment & Plan Note (Signed)
Improved, occasional palpitations Taking propranolol Has seen cardiology Has decreased caffeine intake

## 2018-06-10 NOTE — Assessment & Plan Note (Signed)
Chronic upper back and neck tightness Takes flexeril only as needed Takes gabapentin as needed

## 2018-06-16 ENCOUNTER — Other Ambulatory Visit (INDEPENDENT_AMBULATORY_CARE_PROVIDER_SITE_OTHER)

## 2018-06-16 DIAGNOSIS — E559 Vitamin D deficiency, unspecified: Secondary | ICD-10-CM

## 2018-06-16 DIAGNOSIS — R5383 Other fatigue: Secondary | ICD-10-CM

## 2018-06-16 LAB — CBC WITH DIFFERENTIAL/PLATELET
BASOS ABS: 0 10*3/uL (ref 0.0–0.1)
Basophils Relative: 0.6 % (ref 0.0–3.0)
EOS PCT: 1.2 % (ref 0.0–5.0)
Eosinophils Absolute: 0.1 10*3/uL (ref 0.0–0.7)
HEMATOCRIT: 37.3 % (ref 36.0–46.0)
Hemoglobin: 12.7 g/dL (ref 12.0–15.0)
LYMPHS ABS: 1.7 10*3/uL (ref 0.7–4.0)
Lymphocytes Relative: 24.5 % (ref 12.0–46.0)
MCHC: 34.1 g/dL (ref 30.0–36.0)
MCV: 97.2 fl (ref 78.0–100.0)
MONOS PCT: 7.2 % (ref 3.0–12.0)
Monocytes Absolute: 0.5 10*3/uL (ref 0.1–1.0)
NEUTROS ABS: 4.6 10*3/uL (ref 1.4–7.7)
NEUTROS PCT: 66.5 % (ref 43.0–77.0)
PLATELETS: 260 10*3/uL (ref 150.0–400.0)
RBC: 3.83 Mil/uL — AB (ref 3.87–5.11)
RDW: 11.9 % (ref 11.5–15.5)
WBC: 7 10*3/uL (ref 4.0–10.5)

## 2018-06-16 LAB — COMPREHENSIVE METABOLIC PANEL
ALT: 15 U/L (ref 0–35)
AST: 20 U/L (ref 0–37)
Albumin: 4.6 g/dL (ref 3.5–5.2)
Alkaline Phosphatase: 44 U/L (ref 39–117)
BUN: 10 mg/dL (ref 6–23)
CALCIUM: 9.6 mg/dL (ref 8.4–10.5)
CO2: 29 meq/L (ref 19–32)
Chloride: 101 mEq/L (ref 96–112)
Creatinine, Ser: 1.08 mg/dL (ref 0.40–1.20)
GFR: 60.9 mL/min (ref >60.00)
GLUCOSE: 91 mg/dL (ref 70–99)
POTASSIUM: 3.6 meq/L (ref 3.5–5.1)
Sodium: 139 mEq/L (ref 135–145)
Total Bilirubin: 0.6 mg/dL (ref 0.2–1.2)
Total Protein: 7.2 g/dL (ref 6.0–8.3)

## 2018-06-16 LAB — TSH: TSH: 1 u[IU]/mL (ref 0.35–4.50)

## 2018-06-16 LAB — VITAMIN D 25 HYDROXY (VIT D DEFICIENCY, FRACTURES): VITD: 29.44 ng/mL — AB (ref 30.00–100.00)

## 2018-06-17 ENCOUNTER — Encounter: Payer: Self-pay | Admitting: Internal Medicine

## 2018-06-21 ENCOUNTER — Other Ambulatory Visit: Payer: Self-pay | Admitting: Internal Medicine

## 2018-06-29 NOTE — Progress Notes (Signed)
[name] [address] [place], [place] [address] Phone: [phone] Subjective:     CC: Back and neck pain follow-up  BJY:NWGNFAOZHY  Margaret Deleon is a 37 y.o. female coming in with complaint of back pain.  Patient we have seen multiple times.  Some mild increase in aching and pain in the lower part of the neck.  Rates the severity of pain is 7 out of 10.  Still being very active.  Continues to run.  Training for a 5K.      Past Medical History:  Diagnosis Date  . Anxiety 09/17/2017  . Depression   . Elevated blood pressure reading 10/14/2017   Elevated blood pressure  . Hypertension   . Migraine headache 09/17/2017   Has been on propranolol and labetalol in the past Did not tolerate Topamax Takes Excedrin Migraine as needed, has not tried has tried Fioricet once  . Migraines   . Muscle tightness 09/17/2017  . Neck pain 09/17/2017  . Nonallopathic lesion of cervical region 10/06/2017  . Nonallopathic lesion of lumbosacral region 10/06/2017  . Nonallopathic lesion of thoracic region 10/06/2017  . Palpitations 09/17/2017   Palpitations   Past Surgical History:  Procedure Laterality Date  . BREAST ENHANCEMENT SURGERY    . CESAREAN SECTION     Social History   Socioeconomic History  . Marital status: Married    Spouse name: Not on file  . Number of children: 3  . Years of education: Not on file  . Highest education level: Not on file  Occupational History  . Not on file  Social Needs  . Financial resource strain: Not on file  . Food insecurity:    Worry: Not on file    Inability: Not on file  . Transportation needs:    Medical: Not on file    Non-medical: Not on file  Tobacco Use  . Smoking status: Never Smoker  . Smokeless tobacco: Never Used  Substance and Sexual Activity  . Alcohol use: Yes    Comment: occasionally   . Drug use: No  . Sexual activity: Yes  Lifestyle  . Physical activity:    Days per week: Not on file    Minutes  per session: Not on file  . Stress: Not on file  Relationships  . Social connections:    Talks on phone: Not on file    Gets together: Not on file    Attends religious service: Not on file    Active member of club or organization: Not on file    Attends meetings of clubs or organizations: Not on file    Relationship status: Not on file  Other Topics Concern  . Not on file  Social History Narrative  . Not on file   Allergies  Allergen Reactions  . Codeine Nausea And Vomiting  . Topamax [Topiramate]     Felt weird, high like and tired   Family History  Problem Relation Age of Onset  . Depression Mother   . Cancer Father   . Alcohol abuse Brother   . Depression Brother   . Migraines Sister      Current Outpatient Medications (Cardiovascular):  .  propranolol ER (INDERAL LA) 60 MG 24 hr capsule, TAKE 1 CAPSULE(60 MG) BY MOUTH DAILY   Current Outpatient Medications (Analgesics):  .  butalbital-acetaminophen-caffeine (FIORICET, ESGIC) 50-325-40 MG tablet, Take 1-2 tablets by mouth every 6 (six) hours as needed for headache.   Current Outpatient Medications (Other):  .  cyclobenzaprine (FLEXERIL) 10 MG tablet, Take 0.5-1 tablets (5-10 mg total) by mouth at bedtime. .  gabapentin (NEURONTIN) 100 MG capsule, Take 2 capsules (200 mg total) by mouth at bedtime. .  hydrOXYzine (ATARAX/VISTARIL) 10 MG tablet, Take 1 tablet (10 mg total) by mouth 3 (three) times daily as needed. [name] Kitchen  LORazepam (ATIVAN) 0.5 MG tablet, TAKE 1 TABLET(0.5 MG) BY MOUTH DAILY AS NEEDED FOR ANXIETY .  prochlorperazine (COMPAZINE) 10 MG tablet, Take 1 tablet (10 mg total) by mouth every 8 (eight) hours as needed for refractory nausea / vomiting (refractory headache). .  sertraline (ZOLOFT) 50 MG tablet, TAKE 1 TABLET(50 MG) BY MOUTH AT BEDTIME .  Vitamin D, Ergocalciferol, (DRISDOL) 50000 units CAPS capsule, TAKE 1 CAPSULE BY MOUTH EVERY 7 DAYS    Past medical history, social, surgical and family history  all reviewed in electronic medical record.  No pertanent information unless stated regarding to the chief complaint.   Review of Systems:  No headache, visual changes, nausea, vomiting, diarrhea, constipation, dizziness, abdominal pain, skin rash, fevers, chills, night sweats, weight loss, swollen lymph nodes, body aches, joint swelling,  chest pain, shortness of breath, mood changes.  Positive muscle aches  Objective  Blood pressure 116/76, pulse 62, resp. rate 16, weight 178 lb (80.7 kg), SpO2 98 %.    General: No apparent distress alert and oriented x3 mood and affect normal, dressed appropriately.  HEENT: Pupils equal, extraocular movements intact  Respiratory: Patient's speak in full sentences and does not appear short of breath  Cardiovascular: No lower extremity edema, non tender, no erythema  Skin: Warm dry intact with no signs of infection or rash on extremities or on axial skeleton.  Abdomen: Soft nontender  Neuro: Cranial nerves II through XII are intact, neurovascularly intact in all extremities with 2+ DTRs and 2+ pulses.  Lymph: No lymphadenopathy of posterior or anterior cervical chain or axillae bilaterally.  Gait antalgic MSK:  Non tender with full range of motion and good stability and symmetric strength and tone of shoulders, elbows, wrist, hip, knee and ankles bilaterally.   Neck: Inspection loss of lordosis. No palpable stepoffs. Negative Spurling's maneuver. Loss of sidebending bilaterally. Grip strength and sensation normal in bilateral hands Strength good C4 to T1 distribution No sensory change to C4 to T1 Negative Hoffman sign bilaterally Reflexes normal Tightness of the trapezius bilaterally.  Osteopathic findings C2 flexed rotated and side bent right C6 flexed rotated and side bent left T3 extended rotated and side bent right inhaled third rib T7 extended rotated and side bent left L2 flexed rotated and side bent right Sacrum right on right        Impression and Recommendations:     This case required medical decision making of moderate complexity. The above documentation has been reviewed and is accurate and complete Judi Saa, DO       Note: This dictation was prepared with Dragon dictation along with smaller phrase technology. Any transcriptional errors that result from this process are unintentional.

## 2018-06-30 ENCOUNTER — Encounter: Payer: Self-pay | Admitting: Family Medicine

## 2018-06-30 ENCOUNTER — Ambulatory Visit (INDEPENDENT_AMBULATORY_CARE_PROVIDER_SITE_OTHER): Admitting: Family Medicine

## 2018-06-30 VITALS — BP 116/76 | HR 62 | Resp 16 | Wt 178.0 lb

## 2018-06-30 DIAGNOSIS — M999 Biomechanical lesion, unspecified: Secondary | ICD-10-CM | POA: Diagnosis not present

## 2018-06-30 DIAGNOSIS — M542 Cervicalgia: Secondary | ICD-10-CM | POA: Diagnosis not present

## 2018-06-30 NOTE — Assessment & Plan Note (Signed)
Continues to have the neck pain.  We have discussed the possibility for further work-up including the possibility of epidurals.  X-rays showed only mild osteoarthritic changes.  Patient was unable to tolerate Effexor but I do believe the patient's new anti-anxiety medication does not seem to be beneficial.  We discussed the possibility of changing to Cymbalta.  Patient will ask primary care provider.  He with conservative therapy.  Follow-up again in 4 to 6 weeks

## 2018-06-30 NOTE — Assessment & Plan Note (Signed)
Decision today to treat with OMT was based on Physical Exam  After verbal consent patient was treated with HVLA, ME, FPR techniques in cervical, thoracic, lrib, umbar and sacral areas  Patient tolerated the procedure well with improvement in symptoms  Patient given exercises, stretches and lifestyle modifications  See medications in patient instructions if given  Patient will follow up in 4-8 weeks

## 2018-06-30 NOTE — Patient Instructions (Signed)
Good to see you  Ice is your friend Stay active Keep working on the posture Consider cymbalta or ask Dr. Lawerance Bach on other medicines See me again in 2 months

## 2018-07-19 ENCOUNTER — Other Ambulatory Visit: Payer: Self-pay | Admitting: Internal Medicine

## 2018-07-23 ENCOUNTER — Encounter: Payer: Self-pay | Admitting: Internal Medicine

## 2018-07-24 MED ORDER — NITROFURANTOIN MONOHYD MACRO 100 MG PO CAPS: 100.0000 mg | ORAL_CAPSULE | Freq: Two times a day (BID) | ORAL | 0 refills | Status: DC

## 2018-08-24 NOTE — Progress Notes (Signed)
[name] [address] [place], [place] [address] Phone: [phone] Subjective:   [name], am serving as a scribe for Dr. Antoine Primas.  I'm seeing this patient by the request  of:    CC: back and neck pain   BJY:NWGNFAOZHY  Margaret Deleon is a 37 y.o. female coming in with complaint of back and neck pain. Her upper back pain in the same as last visit. She does note that pain in the right SI joint is worse. She feels like the area is tender to palpation. She has decreased her mileage with running. Has noticed radicular symptoms in both hands and feet.  Patient states the pain is worsening.  Nothing seems to be helping.  Even the medications that seem to be more beneficial seems to be worsening at this moment.     Past Medical History:  Diagnosis Date  . Anxiety 09/17/2017  . Depression   . Elevated blood pressure reading 10/14/2017   Elevated blood pressure  . Hypertension   . Migraine headache 09/17/2017   Has been on propranolol and labetalol in the past Did not tolerate Topamax Takes Excedrin Migraine as needed, has not tried has tried Fioricet once  . Migraines   . Muscle tightness 09/17/2017  . Neck pain 09/17/2017  . Nonallopathic lesion of cervical region 10/06/2017  . Nonallopathic lesion of lumbosacral region 10/06/2017  . Nonallopathic lesion of thoracic region 10/06/2017  . Palpitations 09/17/2017   Palpitations   Past Surgical History:  Procedure Laterality Date  . BREAST ENHANCEMENT SURGERY    . CESAREAN SECTION     Social History   Socioeconomic History  . Marital status: Married    Spouse name: Not on file  . Number of children: 3  . Years of education: Not on file  . Highest education level: Not on file  Occupational History  . Not on file  Social Needs  . Financial resource strain: Not on file  . Food insecurity:    Worry: Not on file    Inability: Not on file  . Transportation needs:    Medical: Not on file   Non-medical: Not on file  Tobacco Use  . Smoking status: Never Smoker  . Smokeless tobacco: Never Used  Substance and Sexual Activity  . Alcohol use: Yes    Comment: occasionally   . Drug use: No  . Sexual activity: Yes  Lifestyle  . Physical activity:    Days per week: Not on file    Minutes per session: Not on file  . Stress: Not on file  Relationships  . Social connections:    Talks on phone: Not on file    Gets together: Not on file    Attends religious service: Not on file    Active member of club or organization: Not on file    Attends meetings of clubs or organizations: Not on file    Relationship status: Not on file  Other Topics Concern  . Not on file  Social History Narrative  . Not on file   Allergies  Allergen Reactions  . Codeine Nausea And Vomiting  . Topamax [Topiramate]     Felt weird, high like and tired   Family History  Problem Relation Age of Onset  . Depression Mother   . Cancer Father   . Alcohol abuse Brother   . Depression Brother   . Migraines Sister     Current Outpatient Medications (Endocrine & Metabolic):  .  levothyroxine (SYNTHROID, LEVOTHROID) 25 MCG tablet, Take 1 tablet (25 mcg total) by mouth daily.  Current Outpatient Medications (Cardiovascular):  .  propranolol ER (INDERAL LA) 60 MG 24 hr capsule, TAKE 1 CAPSULE(60 MG) BY MOUTH DAILY   Current Outpatient Medications (Analgesics):  .  butalbital-acetaminophen-caffeine (FIORICET, ESGIC) 50-325-40 MG tablet, Take 1-2 tablets by mouth every 6 (six) hours as needed for headache.   Current Outpatient Medications (Other):  .  cyclobenzaprine (FLEXERIL) 10 MG tablet, Take 0.5-1 tablets (5-10 mg total) by mouth at bedtime. .  gabapentin (NEURONTIN) 100 MG capsule, Take 2 capsules (200 mg total) by mouth at bedtime. .  hydrOXYzine (ATARAX/VISTARIL) 10 MG tablet, Take 1 tablet (10 mg total) by mouth 3 (three) times daily as needed. [name] Kitchen.  LORazepam (ATIVAN) 0.5 MG tablet, TAKE 1  TABLET(0.5 MG) BY MOUTH DAILY AS NEEDED FOR ANXIETY .  Multiple Vitamin (MULTIVITAMIN) tablet, Take 1 tablet by mouth daily. .  nitrofurantoin, macrocrystal-monohydrate, (MACROBID) 100 MG capsule, Take 1 capsule (100 mg total) by mouth 2 (two) times daily. .  prochlorperazine (COMPAZINE) 10 MG tablet, Take 1 tablet (10 mg total) by mouth every 8 (eight) hours as needed for refractory nausea / vomiting (refractory headache). .  sertraline (ZOLOFT) 50 MG tablet, TAKE 1 TABLET(50 MG) BY MOUTH AT BEDTIME .  Vitamin D, Ergocalciferol, (DRISDOL) 50000 units CAPS capsule, TAKE 1 CAPSULE BY MOUTH EVERY 7 DAYS    Past medical history, social, surgical and family history all reviewed in electronic medical record.  No pertanent information unless stated regarding to the chief complaint.   Review of Systems:  No headache, visual changes, nausea, vomiting, diarrhea, constipation, dizziness, abdominal pain, skin rash, fevers, chills, night sweats, weight loss, swollen lymph nodes, body aches, joint swelling, chest pain, shortness of breath, mood changes.  Positive muscle aches  Objective  Blood pressure 132/90, pulse 70, height 5\' 7"  (1.702 m), weight 179 lb (81.2 kg), SpO2 99 %.    General: No apparent distress alert and oriented x3 mood and affect normal, dressed appropriately.  HEENT: Pupils equal, extraocular movements intact  Respiratory: Patient's speak in full sentences and does not appear short of breath  Cardiovascular: No lower extremity edema, non tender, no erythema  Skin: Warm dry intact with no signs of infection or rash on extremities or on axial skeleton.  Abdomen: Soft nontender  Neuro: Cranial nerves II through XII are intact, neurovascularly intact in all extremities with 2+ DTRs and 2+ pulses.  Lymph: No lymphadenopathy of posterior or anterior cervical chain or axillae bilaterally.  Gait normal with good balance and coordination.  MSK:  Non tender with full range of motion and good  stability and symmetric strength and tone of shoulders, elbows, wrist, hip, knee and ankles bilaterally.   Back Exam:  Inspection: Loss of lordosis Motion: Flexion 40 deg, Extension 25 deg, Side Bending to 30 deg bilaterally, Rotation to 35 deg bilaterally  SLR laying: Positive right XSLR laying: Negative  Palpable tenderness: Tender to palpation the paraspinal musculature lumbar spine right greater than left. FABER: Positive right. Sensory change: Gross sensation intact to all lumbar and sacral dermatomes.  Reflexes: 2+ at both patellar tendons, 2+ at achilles tendons, Babinski's downgoing.  Strength at foot  Plantar-flexion: 5/5 Dorsi-flexion: 5/5 Eversion: 5/5 Inversion: 5/5  Leg strength  Quad: 5/5 Hamstring: 5/5 Hip flexor: 5/5 Hip abductors: 4/5 but symmetric   Osteopathic findings C2 flexed rotated and side bent right C4 flexed rotated and side bent left C6 flexed rotated and  side bent left T3 extended rotated and side bent right inhaled third rib    Impression and Recommendations:     This case required medical decision making of moderate complexity. The above documentation has been reviewed and is accurate and complete Judi Saa, DO       Note: This dictation was prepared with Dragon dictation along with smaller phrase technology. Any transcriptional errors that result from this process are unintentional.

## 2018-08-25 ENCOUNTER — Ambulatory Visit (INDEPENDENT_AMBULATORY_CARE_PROVIDER_SITE_OTHER): Admitting: Family Medicine

## 2018-08-25 ENCOUNTER — Encounter: Payer: Self-pay | Admitting: Family Medicine

## 2018-08-25 VITALS — BP 132/90 | HR 70 | Ht 67.0 in | Wt 179.0 lb

## 2018-08-25 DIAGNOSIS — M999 Biomechanical lesion, unspecified: Secondary | ICD-10-CM | POA: Diagnosis not present

## 2018-08-25 DIAGNOSIS — M5416 Radiculopathy, lumbar region: Secondary | ICD-10-CM | POA: Insufficient documentation

## 2018-08-25 MED ORDER — KETOROLAC TROMETHAMINE 60 MG/2ML IM SOLN
60.0000 mg | Freq: Once | INTRAMUSCULAR | Status: AC
  Administered 2018-08-25: 60 mg via INTRAMUSCULAR

## 2018-08-25 MED ORDER — METHYLPREDNISOLONE ACETATE 80 MG/ML IJ SUSP
80.0000 mg | Freq: Once | INTRAMUSCULAR | Status: AC
  Administered 2018-08-25: 80 mg via INTRAMUSCULAR

## 2018-08-25 MED ORDER — LEVOTHYROXINE SODIUM 25 MCG PO TABS: 25.0000 ug | ORAL_TABLET | Freq: Every day | ORAL | 1 refills | Status: DC

## 2018-08-25 NOTE — Assessment & Plan Note (Signed)
Decision today to treat with OMT was based on Physical Exam  After verbal consent patient was treated with HVLA, ME, FPR techniques in cervical, thoracic rib areas  Patient tolerated the procedure well with improvement in symptoms  Patient given exercises, stretches and lifestyle modifications  See medications in patient instructions if given  Patient will follow up in 4 weeks 

## 2018-08-25 NOTE — Patient Instructions (Addendum)
Good to see you  Ice is your friend We will get MRi of the lumbar spine  2 injections today  Stay active PTH and calcium was normal  Synthroid daily  See me again in 3-4 weeks

## 2018-08-25 NOTE — Assessment & Plan Note (Signed)
Patient is having lumbar radiculopathy.  He did not feel comfortable doing osteopathic manipulation.  Which activities to do home exercises he avoid.  Patient will have an MRI of the lumbar spine to further evaluate.  Severe pain over the right paraspinal musculature.  Patient has some mild weakness of the hip abductors.  Follow-up again after imaging to discuss further.

## 2018-08-28 ENCOUNTER — Encounter: Payer: Self-pay | Admitting: Family Medicine

## 2018-09-05 ENCOUNTER — Other Ambulatory Visit: Payer: Self-pay | Admitting: Internal Medicine

## 2018-09-05 ENCOUNTER — Ambulatory Visit
Admission: RE | Admit: 2018-09-05 | Discharge: 2018-09-05 | Disposition: A | Discharge status: Home / Self Care | Source: Ambulatory Visit | Attending: Family Medicine | Admitting: Family Medicine

## 2018-09-05 DIAGNOSIS — M5416 Radiculopathy, lumbar region: Secondary | ICD-10-CM

## 2018-09-06 ENCOUNTER — Encounter: Payer: Self-pay | Admitting: Family Medicine

## 2018-09-07 ENCOUNTER — Encounter: Payer: Self-pay | Admitting: Family Medicine

## 2018-09-07 NOTE — Telephone Encounter (Signed)
[facility] Controlled Database Checked Last filled: 04/14/18 # 30 LOV w/PCP: 06/10/18 Next appt w/PCP: 12/09/18

## 2018-09-07 NOTE — Telephone Encounter (Signed)
Sent in #10 as this appears to be more than 1 month supply

## 2018-09-21 NOTE — Progress Notes (Signed)
Margaret ScaleZach Salene Deleon D.O. [facility] [address] [place], [place] [address] Phone: [phone] Subjective:    I Margaret NighKana Deleon am serving as a Neurosurgeonscribe for Dr. Antoine PrimasZachary Miangel Deleon.   CC: Back pain follow-up  BJY:[id number]:Subjective  Margaret ChristineRachel Deleon is a 38 y.o. female coming in with complaint of back pain. States that her lower back and SI joint is doing better. Still has some pain and discomfort. MRI did show some mild bulging disc but nothing specific.  Patient was taking the Synthroid but felt like she broke into the rash.  Patient continues to have chronic fatigue on the propranolol but it does help with her headaches.  Has been working on the home exercises and working out on a more regular basis     Past Medical History:  Diagnosis Date  . Anxiety 09/17/2017  . Depression   . Elevated blood pressure reading 10/14/2017   Elevated blood pressure  . Hypertension   . Migraine headache 09/17/2017   Has been on propranolol and labetalol in the past Did not tolerate Topamax Takes Excedrin Migraine as needed, has not tried has tried Fioricet once  . Migraines   . Muscle tightness 09/17/2017  . Neck pain 09/17/2017  . Nonallopathic lesion of cervical region 10/06/2017  . Nonallopathic lesion of lumbosacral region 10/06/2017  . Nonallopathic lesion of thoracic region 10/06/2017  . Palpitations 09/17/2017   Palpitations   Past Surgical History:  Procedure Laterality Date  . BREAST ENHANCEMENT SURGERY    . CESAREAN SECTION     Social History   Socioeconomic History  . Marital status: Married    Spouse name: Not on file  . Number of children: 3  . Years of education: Not on file  . Highest education level: Not on file  Occupational History  . Not on file  Social Needs  . Financial resource strain: Not on file  . Food insecurity:    Worry: Not on file    Inability: Not on file  . Transportation needs:    Medical: Not on file    Non-medical: Not on file  Tobacco Use  . Smoking status: Never  Smoker  . Smokeless tobacco: Never Used  Substance and Sexual Activity  . Alcohol use: Yes    Comment: occasionally   . Drug use: No  . Sexual activity: Yes  Lifestyle  . Physical activity:    Days per week: Not on file    Minutes per session: Not on file  . Stress: Not on file  Relationships  . Social connections:    Talks on phone: Not on file    Gets together: Not on file    Attends religious service: Not on file    Active member of club or organization: Not on file    Attends meetings of clubs or organizations: Not on file    Relationship status: Not on file  Other Topics Concern  . Not on file  Social History Narrative  . Not on file   Allergies  Allergen Reactions  . Codeine Nausea And Vomiting  . Topamax [Topiramate]     Felt weird, high like and tired   Family History  Problem Relation Age of Onset  . Depression Mother   . Cancer Father   . Alcohol abuse Brother   . Depression Brother   . Migraines Sister     Current Outpatient Medications (Endocrine & Metabolic):  .  levothyroxine (SYNTHROID, LEVOTHROID) 25 MCG tablet, Take 1 tablet (25 mcg total)  by mouth daily.  Current Outpatient Medications (Cardiovascular):  .  propranolol ER (INDERAL LA) 60 MG 24 hr capsule, TAKE 1 CAPSULE(60 MG) BY MOUTH DAILY     Current Outpatient Medications (Other):  .  cyclobenzaprine (FLEXERIL) 10 MG tablet, Take 0.5-1 tablets (5-10 mg total) by mouth at bedtime. .  gabapentin (NEURONTIN) 100 MG capsule, Take 2 capsules (200 mg total) by mouth at bedtime. .  hydrOXYzine (ATARAX/VISTARIL) 10 MG tablet, Take 1 tablet (10 mg total) by mouth 3 (three) times daily as needed. [name] Kitchen  LORazepam (ATIVAN) 0.5 MG tablet, TAKE 1 TABLET(0.5 MG) BY MOUTH DAILY AS NEEDED FOR ANXIETY .  Multiple Vitamin (MULTIVITAMIN) tablet, Take 1 tablet by mouth daily. .  nitrofurantoin, macrocrystal-monohydrate, (MACROBID) 100 MG capsule, Take 1 capsule (100 mg total) by mouth 2 (two) times daily. .   prochlorperazine (COMPAZINE) 10 MG tablet, Take 1 tablet (10 mg total) by mouth every 8 (eight) hours as needed for refractory nausea / vomiting (refractory headache). .  sertraline (ZOLOFT) 50 MG tablet, TAKE 1 TABLET(50 MG) BY MOUTH AT BEDTIME .  Vitamin D, Ergocalciferol, (DRISDOL) 50000 units CAPS capsule, TAKE 1 CAPSULE BY MOUTH EVERY 7 DAYS    Past medical history, social, surgical and family history all reviewed in electronic medical record.  No pertanent information unless stated regarding to the chief complaint.   Review of Systems:  No , visual changes, nausea, vomiting, diarrhea, constipation, dizziness, abdominal pain, skin rash, fevers, chills,  weight loss, swollen lymph nodes, body aches, joint swelling,  chest pain, shortness of breath, mood changes.  Positive muscle aches, headaches, muscle fatigue, mild night sweats  Objective  Blood pressure 130/90, pulse 70, height 5\' 7"  (1.702 m), weight 179 lb (81.2 kg), SpO2 98 %.    General: No apparent distress alert and oriented x3 mood and affect normal, dressed appropriately.  HEENT: Pupils equal, extraocular movements intact  Respiratory: Patient's speak in full sentences and does not appear short of breath  Cardiovascular: No lower extremity edema, non tender, no erythema  Skin: Warm dry intact with no signs of infection or rash on extremities or on axial skeleton.  Abdomen: Soft nontender  Neuro: Cranial nerves II through XII are intact, neurovascularly intact in all extremities with 2+ DTRs and 2+ pulses.  Lymph: No lymphadenopathy of posterior or anterior cervical chain or axillae bilaterally.  Gait normal with good balance and coordination.  MSK:  Non tender with full range of motion and good stability and symmetric strength and tone of shoulders, elbows, wrist, hip, knee and ankles bilaterally.  Back Exam:  Inspection: Loss of lordosis Motion: Flexion 45 deg, Extension 25 deg, Side Bending to 45 deg bilaterally,    Rotation to 45 deg bilaterally  SLR laying: Negative  XSLR laying: Negative  Palpable tenderness: Tender more in the thoracolumbar juncture.  Mild over the right sacroiliac joint. FABER: negative. Sensory change: Gross sensation intact to all lumbar and sacral dermatomes.  Reflexes: 2+ at both patellar tendons, 2+ at achilles tendons, Babinski's downgoing.  Strength at foot  Plantar-flexion: 5/5 Dorsi-flexion: 5/5 Eversion: 5/5 Inversion: 5/5  Leg strength  Quad: 5/5 Hamstring: 5/5 Hip flexor: 5/5 Hip abductors: 5/5    Osteopathic findings C2 flexed rotated and side bent right C6 flexed rotated and side bent left T5 extended rotated and side bent right inhaled rib T9 extended rotated and side bent left L2 flexed rotated and side bent right Sacrum right on right    Impression and Recommendations:  This case required medical decision making of moderate complexity. The above documentation has been reviewed and is accurate and complete Lyndal Pulley, DO       Note: This dictation was prepared with Dragon dictation along with smaller phrase technology. Any transcriptional errors that result from this process are unintentional.

## 2018-09-22 ENCOUNTER — Ambulatory Visit (INDEPENDENT_AMBULATORY_CARE_PROVIDER_SITE_OTHER): Admitting: Family Medicine

## 2018-09-22 ENCOUNTER — Encounter: Payer: Self-pay | Admitting: Family Medicine

## 2018-09-22 VITALS — BP 130/90 | HR 70 | Ht 67.0 in | Wt 179.0 lb

## 2018-09-22 DIAGNOSIS — M999 Biomechanical lesion, unspecified: Secondary | ICD-10-CM

## 2018-09-22 DIAGNOSIS — R0683 Snoring: Secondary | ICD-10-CM

## 2018-09-22 DIAGNOSIS — M5416 Radiculopathy, lumbar region: Secondary | ICD-10-CM

## 2018-09-22 NOTE — Assessment & Plan Note (Signed)
Stable overall.  Discussed icing regimen and home exercises.  I do believe the patient will do well with this.  Believe there is some underlying causes including potential anxiety that could be contributing.  Patient has had difficulty with medication so.  Once again also concern for subclinical hypothyroidism.  Encourage patient to try the Synthroid again.  Follow-up with me again in 4 to 8 weeks

## 2018-09-22 NOTE — Patient Instructions (Signed)
Good to see you  We will get you a sleep study  Try the synthroid again and watch for the rash  Continue everything else Good luck with the challenge  See me again in 6 weeks

## 2018-09-22 NOTE — Assessment & Plan Note (Signed)
Decision today to treat with OMT was based on Physical Exam  After verbal consent patient was treated with HVLA, ME, FPR techniques in cervical, thoracic, rib lumbar and sacral areas  Patient tolerated the procedure well with improvement in symptoms  Patient given exercises, stretches and lifestyle modifications  See medications in patient instructions if given  Patient will follow up in 4-8 weeks 

## 2018-09-23 ENCOUNTER — Other Ambulatory Visit: Payer: Self-pay | Admitting: *Deleted

## 2018-09-23 MED ORDER — GABAPENTIN 100 MG PO CAPS: 200.0000 mg | ORAL_CAPSULE | Freq: Every day | ORAL | 1 refills | Status: DC

## 2018-10-01 ENCOUNTER — Encounter: Payer: Self-pay | Admitting: Neurology

## 2018-10-01 ENCOUNTER — Ambulatory Visit (INDEPENDENT_AMBULATORY_CARE_PROVIDER_SITE_OTHER): Admitting: Neurology

## 2018-10-01 VITALS — BP 152/97 | HR 64 | Ht 67.0 in | Wt 182.0 lb

## 2018-10-01 DIAGNOSIS — R519 Headache, unspecified: Secondary | ICD-10-CM

## 2018-10-01 DIAGNOSIS — R0683 Snoring: Secondary | ICD-10-CM

## 2018-10-01 DIAGNOSIS — R400 Somnolence: Secondary | ICD-10-CM | POA: Diagnosis not present

## 2018-10-01 DIAGNOSIS — E663 Overweight: Secondary | ICD-10-CM

## 2018-10-01 DIAGNOSIS — G4719 Other hypersomnia: Secondary | ICD-10-CM | POA: Diagnosis not present

## 2018-10-01 DIAGNOSIS — R51 Headache: Secondary | ICD-10-CM

## 2018-10-01 DIAGNOSIS — R351 Nocturia: Secondary | ICD-10-CM

## 2018-10-01 NOTE — Patient Instructions (Signed)

## 2018-10-01 NOTE — Progress Notes (Signed)
Subjective:    Patient ID: Margaret Deleon is a 38 y.o. female.  HPI     Margaret Foley, MD, PhD [facility] [address], [address] [address] [place], [place] [address]  Dear Dr. Katrinka Deleon,   I saw your patient, Margaret Deleon, upon your kind request in my sleep clinic today for initial consultation of her sleep disorder, in particular, concern for underlying obstructive sleep apnea. The patient is unaccompanied today. As you know, Margaret Deleon is a 38 year old right-handed woman with an underlying medical history of anxiety, depression, hypertension, migraine headaches, neck pain, back pain, palpitations and overweight state, who reports snoring and excessive daytime somnolence. She believe she snores loudly at times. I reviewed your office note from 09/22/2018. Her Epworth sleepiness score is 17 out of 24, fatigue score is 49 out of 63. Of note, she is on several potentially sedating medications including lorazepam as needed 0.5 mg, gabapentin 200 mg at bedtime, Flexeril 5-10 mg at bedtime, sertraline 50 mg daily, she also takes long-acting Inderal 60 mg daily. She reports sleepiness at the wheel. She is married and lives with her husband and 3 children. She works at [employer]. She is a nonsmoker and drinks alcohol on occasion, caffeine in the form of coffee, one cup per day on average. She reports taking lorazepam rarely. She takes the sertraline at bedtime and takes Flexeril as needed. She has been on sertraline for about a year. She tried something else before but it did not work as well for anxiety. She believes she has had some weight gain since starting the sertraline. She has been sleepy during the day for at least a year. It has been somewhat progressive. She does work full-time as a Psychologist, educational, has 3 children, ages 10, 29 and 3. She has tried to come off of the Inderal but had a flareup in her migraines and restarted it. She has had the occasional sleep  paralysis and has woken up in the middle of the night with dreamlike images. She does not have any sleep attacks per se. She gets primarily sleepy while driving. She has not had any car accidents or veering out of the [place]. She has nocturia about once per average night and has had morning headaches occasionally. She is not aware of any family history of narcolepsy or sleep disorder such as obstructive sleep apnea. She tries to go to bed around 10:30 or 11 and rise time is 6:55 AM.  Her Past Medical History Is Significant For: Past Medical History:  Diagnosis Date  . Anxiety 09/17/2017  . Depression   . Elevated blood pressure reading 10/14/2017   Elevated blood pressure  . Hypertension   . Migraine headache 09/17/2017   Has been on propranolol and labetalol in the past Did not tolerate Topamax Takes Excedrin Migraine as needed, has not tried has tried Fioricet once  . Migraines   . Muscle tightness 09/17/2017  . Neck pain 09/17/2017  . Nonallopathic lesion of cervical region 10/06/2017  . Nonallopathic lesion of lumbosacral region 10/06/2017  . Nonallopathic lesion of thoracic region 10/06/2017  . Palpitations 09/17/2017   Palpitations    Her Past Surgical History Is Significant For: Past Surgical History:  Procedure Laterality Date  . BREAST ENHANCEMENT SURGERY    . CESAREAN SECTION      Her Family History Is Significant For: Family History  Problem Relation Age of Onset  . Depression Mother   . Cancer Father   . Alcohol  abuse Brother   . Depression Brother   . Migraines Sister     Her Social History Is Significant For: Social History   Socioeconomic History  . Marital status: Married    Spouse name: Not on file  . Number of children: 3  . Years of education: Not on file  . Highest education level: Not on file  Occupational History  . Not on file  Social Needs  . Financial resource strain: Not on file  . Food insecurity:    Worry: Not on file    Inability: Not on file  .  Transportation needs:    Medical: Not on file    Non-medical: Not on file  Tobacco Use  . Smoking status: Never Smoker  . Smokeless tobacco: Never Used  Substance and Sexual Activity  . Alcohol use: Yes    Comment: occasionally   . Drug use: No  . Sexual activity: Yes  Lifestyle  . Physical activity:    Days per week: Not on file    Minutes per session: Not on file  . Stress: Not on file  Relationships  . Social connections:    Talks on phone: Not on file    Gets together: Not on file    Attends religious service: Not on file    Active member of club or organization: Not on file    Attends meetings of clubs or organizations: Not on file    Relationship status: Not on file  Other Topics Concern  . Not on file  Social History Narrative  . Not on file    Her Allergies Are:  Allergies  Allergen Reactions  . Codeine Nausea And Vomiting  . Topamax [Topiramate]     Felt weird, high like and tired  :   Her Current Medications Are:  Outpatient Encounter Medications as of 10/01/2018  Medication Sig  . cyclobenzaprine (FLEXERIL) 10 MG tablet Take 0.5-1 tablets (5-10 mg total) by mouth at bedtime.  . gabapentin (NEURONTIN) 100 MG capsule Take 2 capsules (200 mg total) by mouth at bedtime.  [name] Kitchen levothyroxine (SYNTHROID, LEVOTHROID) 25 MCG tablet Take 1 tablet (25 mcg total) by mouth daily.  [name] Kitchen LORazepam (ATIVAN) 0.5 MG tablet TAKE 1 TABLET(0.5 MG) BY MOUTH DAILY AS NEEDED FOR ANXIETY  . Multiple Vitamin (MULTIVITAMIN) tablet Take 1 tablet by mouth daily.  . prochlorperazine (COMPAZINE) 10 MG tablet Take 1 tablet (10 mg total) by mouth every 8 (eight) hours as needed for refractory nausea / vomiting (refractory headache).  . propranolol ER (INDERAL LA) 60 MG 24 hr capsule TAKE 1 CAPSULE(60 MG) BY MOUTH DAILY  . sertraline (ZOLOFT) 50 MG tablet TAKE 1 TABLET(50 MG) BY MOUTH AT BEDTIME  . [DISCONTINUED] hydrOXYzine (ATARAX/VISTARIL) 10 MG tablet Take 1 tablet (10 mg total) by mouth 3  (three) times daily as needed.  . [DISCONTINUED] nitrofurantoin, macrocrystal-monohydrate, (MACROBID) 100 MG capsule Take 1 capsule (100 mg total) by mouth 2 (two) times daily.  . [DISCONTINUED] Vitamin D, Ergocalciferol, (DRISDOL) 50000 units CAPS capsule TAKE 1 CAPSULE BY MOUTH EVERY 7 DAYS   No facility-administered encounter medications on file as of 10/01/2018.   :  Review of Systems:  Out of a complete 14 point review of systems, all are reviewed and negative with the exception of these symptoms as listed below: Review of Systems  Neurological:       Pt presents today to discuss her sleep. Pt has never had a sleep study but does endorse snoring.  Epworth Sleepiness  Scale 0= would never doze 1= slight chance of dozing 2= moderate chance of dozing 3= high chance of dozing  Sitting and reading: 2 Watching TV: 2 Sitting inactive in a public place (ex. Theater or meeting): 2 As a passenger in a car for an hour without a break: 3 Lying down to rest in the afternoon: 3 Sitting and talking to someone: 1 Sitting quietly after lunch (no alcohol): 2 In a car, while stopped in traffic: 2 Total: 17     Objective:  Neurological Exam  Physical Exam Physical Examination:   Vitals:   10/01/18 1543  BP: (!) 152/97  Pulse: 64    General Examination: The patient is a very pleasant 38 y.o. female in no acute distress. She appears well-developed and well-nourished and well groomed.   HEENT: Normocephalic, atraumatic, pupils are equal, round and reactive to light and accommodation. Extraocular tracking is good without limitation to gaze excursion or nystagmus noted. Normal smooth pursuit is noted. Hearing is grossly intact. Face is symmetric with normal facial animation and normal facial sensation. Speech is clear with no dysarthria noted. There is no hypophonia. There is no lip, neck/head, jaw or voice tremor. Neck is supple with full range of passive and active motion. There are no  carotid bruits on auscultation. Oropharynx exam reveals: mild mouth dryness, good dental hygiene and mild airway crowding, due to Large uvula, tonsils in place but small. Mallampati is class I. Neck circumference is 14-1/2 inches. Tongue protrudes centrally and palate elevates symmetrically.  Chest: Clear to auscultation without wheezing, rhonchi or crackles noted.  Heart: S1+S2+0, regular and normal without murmurs, rubs or gallops noted.   Abdomen: Soft, non-tender and non-distended with normal bowel sounds appreciated on auscultation.  Extremities: There is no obvious edema.  Skin: Warm and dry without trophic changes noted.  Musculoskeletal: exam reveals no obvious joint deformities, tenderness or joint swelling or erythema.   Neurologically:  Mental status: The patient is awake, alert and oriented in all 4 spheres. Her immediate and remote memory, attention, language skills and fund of knowledge are appropriate. There is no evidence of aphasia, agnosia, apraxia or anomia. Speech is clear with normal prosody and enunciation. Thought process is linear. Mood is normal and affect is normal.  Cranial nerves II - XII are as described above under HEENT exam. In addition: shoulder shrug is normal with equal shoulder height noted. Motor exam: Normal bulk, strength and tone is noted. There is no drift, tremor or rebound. Romberg is negative. Reflexes are 2+ throughout. Fine motor skills and coordination: grossly intact.  Cerebellar testing: No dysmetria or intention tremor on finger to nose testing. Heel to shin is unremarkable bilaterally. There is no truncal or gait ataxia.  Sensory exam: intact to light touch in the upper and lower extremities.  Gait, station and balance: She stands easily. No veering to one side is noted. No leaning to one side is noted. Posture is age-appropriate and stance is narrow based. Gait shows normal stride length and normal pace. No problems turning are noted. Tandem  walk is unremarkable. Intact toe and heel stance is noted.               Assessment and Plan:   In summary, Essie ChristineRachel Capelli is a very pleasant 38 y.o.-year old female with an underlying medical history of anxiety, depression, hypertension, migraine headaches, neck pain, back pain, palpitations and overweight state, who presents for evaluation of her excessive daytime somnolence of at least one years duration.  She does have a history of snoring. Her history is not telltale for narcolepsy, she denies any cataplexy, and a sleep attacks, but has been significantly sleepy. She also does take several potentially sedating medications, not daily typically, not high-dose even. Nevertheless, there could be some medication effect at play. Underlying obstructive sleep apnea is also a possibility. I would like to proceed with a laboratory attended sleep study to further delineate her sleep disorder, especially to rule out obstructive sleep apnea (OSA). I had a long chat with the patient about my findings and the differential diagnosis of excessive daytime somnolence. We talked about sleep apnea, its prognosis and treatment options, she would be willing to try CPAP or AutoPap therapy. Of note, she had recent blood work which I reviewed from October 2019. Her vitamin D level was borderline low. She has been taking multivitamin. She has also been taking low-dose Synthroid. I explained the sleep test procedure to the patient and plan to see her back after the study is completed.  Thank you very much for allowing me to participate in the care of this nice patient. If I can be of any further assistance to you please do not hesitate to call me at [phone].  Sincerely,   Margaret FoleySaima Arnez Stoneking, MD, PhD

## 2018-10-27 ENCOUNTER — Other Ambulatory Visit: Payer: Self-pay | Admitting: Internal Medicine

## 2018-11-02 NOTE — Progress Notes (Signed)
[name] [address] [place], [place] [address] Phone: [phone] Subjective:    I [name] am serving as a Neurosurgeon for Dr. Antoine Primas.    CC: Back pain follow-up  CEY:[id number]  Margaret Deleon is a 38 y.o. female coming in with complaint of back pain. Tightness with lower back pain.  Patient is noticing that she has been having some increasing range of motion and less pain.  Has lost a little weight.  Has been working out on a more regular basis.  Feels like she has some very mild improvement in energy as well.     Past Medical History:  Diagnosis Date  . Anxiety 09/17/2017  . Depression   . Elevated blood pressure reading 10/14/2017   Elevated blood pressure  . Hypertension   . Migraine headache 09/17/2017   Has been on propranolol and labetalol in the past Did not tolerate Topamax Takes Excedrin Migraine as needed, has not tried has tried Fioricet once  . Migraines   . Muscle tightness 09/17/2017  . Neck pain 09/17/2017  . Nonallopathic lesion of cervical region 10/06/2017  . Nonallopathic lesion of lumbosacral region 10/06/2017  . Nonallopathic lesion of thoracic region 10/06/2017  . Palpitations 09/17/2017   Palpitations   Past Surgical History:  Procedure Laterality Date  . BREAST ENHANCEMENT SURGERY    . CESAREAN SECTION     Social History   Socioeconomic History  . Marital status: Married    Spouse name: Not on file  . Number of children: 3  . Years of education: Not on file  . Highest education level: Not on file  Occupational History  . Not on file  Social Needs  . Financial resource strain: Not on file  . Food insecurity:    Worry: Not on file    Inability: Not on file  . Transportation needs:    Medical: Not on file    Non-medical: Not on file  Tobacco Use  . Smoking status: Never Smoker  . Smokeless tobacco: Never Used  Substance and Sexual Activity  . Alcohol use: Yes    Comment: occasionally   . Drug use:  No  . Sexual activity: Yes  Lifestyle  . Physical activity:    Days per week: Not on file    Minutes per session: Not on file  . Stress: Not on file  Relationships  . Social connections:    Talks on phone: Not on file    Gets together: Not on file    Attends religious service: Not on file    Active member of club or organization: Not on file    Attends meetings of clubs or organizations: Not on file    Relationship status: Not on file  Other Topics Concern  . Not on file  Social History Narrative  . Not on file   Allergies  Allergen Reactions  . Codeine Nausea And Vomiting  . Topamax [Topiramate]     Felt weird, high like and tired   Family History  Problem Relation Age of Onset  . Depression Mother   . Cancer Father   . Alcohol abuse Brother   . Depression Brother   . Migraines Sister     Current Outpatient Medications (Endocrine & Metabolic):  .  levothyroxine (SYNTHROID, LEVOTHROID) 25 MCG tablet, Take 1 tablet (25 mcg total) by mouth daily.  Current Outpatient Medications (Cardiovascular):  .  propranolol ER (INDERAL LA) 60 MG 24 hr capsule, TAKE 1  CAPSULE(60 MG) BY MOUTH DAILY     Current Outpatient Medications (Other):  .  cyclobenzaprine (FLEXERIL) 10 MG tablet, Take 0.5-1 tablets (5-10 mg total) by mouth at bedtime. .  gabapentin (NEURONTIN) 100 MG capsule, Take 2 capsules (200 mg total) by mouth at bedtime. [name] Kitchen  LORazepam (ATIVAN) 0.5 MG tablet, TAKE 1 TABLET(0.5 MG) BY MOUTH DAILY AS NEEDED FOR ANXIETY .  Multiple Vitamin (MULTIVITAMIN) tablet, Take 1 tablet by mouth daily. .  prochlorperazine (COMPAZINE) 10 MG tablet, Take 1 tablet (10 mg total) by mouth every 8 (eight) hours as needed for refractory nausea / vomiting (refractory headache). .  sertraline (ZOLOFT) 50 MG tablet, TAKE 1 TABLET(50 MG) BY MOUTH AT BEDTIME    Past medical history, social, surgical and family history all reviewed in electronic medical record.  No pertanent information unless  stated regarding to the chief complaint.   Review of Systems:  No headache, visual changes, nausea, vomiting, diarrhea, constipation, dizziness, abdominal pain, skin rash, fevers, chills, night sweats, weight loss, swollen lymph nodes, body aches, joint swelling,  chest pain, shortness of breath, mood changes.  Positive muscle aches  Objective  Blood pressure 120/90, pulse (!) 59, height 5\' 7"  (1.702 m), weight 178 lb (80.7 kg), SpO2 98 %. f    General: No apparent distress alert and oriented x3 mood and affect normal, dressed appropriately.  HEENT: Pupils equal, extraocular movements intact  Respiratory: Patient's speak in full sentences and does not appear short of breath  Cardiovascular: No lower extremity edema, non tender, no erythema  Skin: Warm dry intact with no signs of infection or rash on extremities or on axial skeleton.  Abdomen: Soft nontender  Neuro: Cranial nerves II through XII are intact, neurovascularly intact in all extremities with 2+ DTRs and 2+ pulses.  Lymph: No lymphadenopathy of posterior or anterior cervical chain or axillae bilaterally.  Gait normal with good balance and coordination.  MSK:  Non tender with full range of motion and good stability and symmetric strength and tone of  elbows, wrist, hip, knee and ankles bilaterally.  Back exam mild tightness around the right sacroiliac joint.  She also has some tenderness to palpation around the inferior aspect of the scapula medially on the right side.  Patient does have some mild tightness also noted negative straight leg test.  Osteopathic findings C6 flexed rotated and side bent left T3 extended rotated and side bent right inhaled third rib T5 extended rotated and side bent left L3 flexed rotated and side bent left  Sacrum right on right     Impression and Recommendations:     This case required medical decision making of moderate complexity. The above documentation has been reviewed and is accurate and  complete Judi Saa, DO       Note: This dictation was prepared with Dragon dictation along with smaller phrase technology. Any transcriptional errors that result from this process are unintentional.

## 2018-11-03 ENCOUNTER — Ambulatory Visit (INDEPENDENT_AMBULATORY_CARE_PROVIDER_SITE_OTHER): Admitting: Family Medicine

## 2018-11-03 ENCOUNTER — Encounter: Payer: Self-pay | Admitting: Family Medicine

## 2018-11-03 VITALS — BP 120/90 | HR 59 | Ht 67.0 in | Wt 178.0 lb

## 2018-11-03 DIAGNOSIS — R5383 Other fatigue: Secondary | ICD-10-CM

## 2018-11-03 DIAGNOSIS — M999 Biomechanical lesion, unspecified: Secondary | ICD-10-CM

## 2018-11-03 DIAGNOSIS — M542 Cervicalgia: Secondary | ICD-10-CM | POA: Diagnosis not present

## 2018-11-03 NOTE — Assessment & Plan Note (Signed)
Decision today to treat with OMT was based on Physical Exam  After verbal consent patient was treated with HVLA, ME, FPR techniques in cervical, thoracic, rib lumbar and sacral areas  Patient tolerated the procedure well with improvement in symptoms  Patient given exercises, stretches and lifestyle modifications  See medications in patient instructions if given  Patient will follow up in 6 weeks 

## 2018-11-03 NOTE — Patient Instructions (Signed)
Good to see you  Margaret Deleon is your friend Keep it up  I am proud of you  If your friend needs [place] have them call [phone] See me again in 6 weeks

## 2018-11-03 NOTE — Assessment & Plan Note (Signed)
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  Has been doing relatively well with the mild Synthroid.  Discussed posture and ergonomics.  Gabapentin intermittently.  Patient is still having difficulty with fatigue and is going to undergo a sleep study in the near future here.  Responds fairly well to manipulation and follow-up again in 6 weeks

## 2018-11-03 NOTE — Assessment & Plan Note (Signed)
Sleep study pending 

## 2018-11-06 ENCOUNTER — Ambulatory Visit (INDEPENDENT_AMBULATORY_CARE_PROVIDER_SITE_OTHER): Admitting: Neurology

## 2018-11-06 DIAGNOSIS — G4719 Other hypersomnia: Secondary | ICD-10-CM

## 2018-11-06 DIAGNOSIS — R51 Headache: Secondary | ICD-10-CM

## 2018-11-06 DIAGNOSIS — G4710 Hypersomnia, unspecified: Secondary | ICD-10-CM

## 2018-11-06 DIAGNOSIS — G4720 Circadian rhythm sleep disorder, unspecified type: Secondary | ICD-10-CM

## 2018-11-06 DIAGNOSIS — R0683 Snoring: Secondary | ICD-10-CM

## 2018-11-06 DIAGNOSIS — E663 Overweight: Secondary | ICD-10-CM

## 2018-11-06 DIAGNOSIS — R351 Nocturia: Secondary | ICD-10-CM

## 2018-11-06 DIAGNOSIS — R400 Somnolence: Secondary | ICD-10-CM

## 2018-11-06 DIAGNOSIS — R519 Headache, unspecified: Secondary | ICD-10-CM

## 2018-11-10 ENCOUNTER — Telehealth: Payer: Self-pay

## 2018-11-10 NOTE — Progress Notes (Signed)
Patient referred by Dr. Michiel Sites, seen by me on 10/01/18, diagnostic PSG on 11/06/18.   Please call and notify the patient that the recent sleep study did not show any significant obstructive sleep apnea. Intermittent mild to moderate snoring was noted; for this, weight loss and avoiding the supine sleep position may help. She achieved mostly light stage sleep, did not achieve any dream sleep. No specific underlying sleep d/o detected. I would recommend review of her medications again, as some of the sleepiness may stem from medication effect. She can FU with Dr. Katrinka Blazing and her PCP.  Please remind patient to try to maintain good sleep hygiene, which means: Keep a regular sleep and wake schedule and make enough time for sleep (7 1/2 to 8 1/2 hours for the average adult are recommended), try not to exercise or have a meal within 2 hours of your bedtime, try to keep your bedroom conducive for sleep, that is, cool and dark, without light distractors such as an illuminated alarm clock, and refrain from watching TV right before sleep or in the middle of the night and do not keep the TV or radio on during the night. If a nightlight is used, have it away from the visual field. Also, try not to use or play on electronic devices at bedtime, such as your cell phone, tablet PC or laptop. If you like to read at bedtime on an electronic device, try to dim the background light as much as possible. Do not eat in the middle of the night. Keep pets away from the bedroom environment. For stress relief, try meditation, deep breathing exercises (there are many books and CDs available), a white noise machine or fan can help to diffuse other noise distractors, such as traffic noise. Do not drink alcohol before bedtime, as it can disturb sleep and cause middle of the night awakenings. Never mix alcohol and sedating medications! Avoid narcotic pain medication close to bedtime, as opioids/narcotics can suppress breathing drive and breathing  effort.    Thanks,  Huston Foley, MD, PhD [facility])

## 2018-11-10 NOTE — Telephone Encounter (Signed)
I called pt to discuss her sleep study results. No answer, left a message asking her to call me back. 

## 2018-11-10 NOTE — Telephone Encounter (Signed)
I called pt. I advised pt that Dr. Frances Furbish reviewed pt's sleep study and found that pt did not show any significant osa. Mild to moderate snoring was noted and Dr. Frances Furbish recommends weight loss and avoiding supine sleep which may help. Pt did not achieve any dream sleep but had mostly light stage sleep. Dr. Frances Furbish recommends that pt review her medications again since some of the sleepiness may stem from medication effect. Dr. Frances Furbish recommends that pt follow up with Dr. Katrinka Blazing and her PCP. I reviewed sleep hygiene recommendations with the pt, including trying to keep a regular sleep wake schedule, avoiding electronics in the bedroom, keeping the bedroom cool, dark, and quiet, and avoiding eating or exercising within 2 hours of bedtime as well as eating in the middle of the night. I advised pt to keep pets out of the bedroom. I discussed with pt the importance of stress relief and to try meditation, deep breathing exercises, and/or a white noise machine or fan to diffuse other noise distractors. I advised pt to not drink alcohol before bedtime and to never mix alcohol and sedating medications. Pt was advised to avoid narcotic pain medication close to bedtime. I advised pt that a copy of these sleep study results will be sent to Dr. Katrinka Blazing. Pt verbalized understanding of results but does not believe she needs to lose weight. Pt reports that she has a lot of muscle because she works out a lot. Pt had no questions at this time but was encouraged to call back if questions arise.

## 2018-11-10 NOTE — Procedures (Signed)
PATIENT'S NAME:  Margaret Deleon, Margaret Deleon DOB:      19-May-1981      MRN:    [id number]     DATE OF RECORDING: 11/06/2018 REFERRING M.D.:  Dr. Antoine Primas Study Performed:   Baseline Polysomnogram HISTORY: 38 year old woman with a history of anxiety, depression, hypertension, migraine headaches, neck pain, back pain, palpitations and overweight state, who reports snoring and excessive daytime somnolence. The patient endorsed the Epworth Sleepiness Scale at 17 points. The patient's weight 182 pounds with a height of 67 (inches), resulting in a BMI of 28.7 kg/m2.   CURRENT MEDICATIONS: Flexeril, Neurontin, Synthroid, Ativan, Compazine, Inderal, Zoloft, Atarax,   PROCEDURE:  This is a multichannel digital polysomnogram utilizing the Somnostar 11.2 system.  Electrodes and sensors were applied and monitored per AASM Specifications.   EEG, EOG, Chin and Limb EMG, were sampled at 200 Hz.  ECG, Snore and Nasal Pressure, Thermal Airflow, Respiratory Effort, CPAP Flow and Pressure, Oximetry was sampled at 50 Hz. Digital video and audio were recorded.      BASELINE STUDY: Lights Out was at 22:18 and Lights On at 04:55.  Total recording time (TRT) was 397.5 minutes, with a total sleep time (TST) of 340 minutes. The patient's sleep latency was 42 minutes, which is delayed. REM sleep was absent. The sleep efficiency was 85.5 %.     SLEEP ARCHITECTURE: WASO (Wake after sleep onset) was 23 minutes with mild sleep fragmentation noted. There were 11 minutes in Stage N1, 245.5 minutes Stage N2, 83.5 minutes Stage N3 and 0 minutes in Stage REM.  The percentage of Stage N1 was 3.2%, Stage N2 was 72.2%, which is increased, Stage N3 was 24.6% and Stage R (REM sleep) was absent.   RESPIRATORY ANALYSIS:  There were a total of 8 respiratory events:  0 obstructive apneas, 0 central apneas and 0 mixed apneas with a total of 0 apneas and an apnea index (AI) of 0 /hour. There were 8 hypopneas with a hypopnea index of 1.4 /hour. The  patient also had 0 respiratory event related arousals (RERAs).      The total APNEA/HYPOPNEA INDEX (AHI) was 1.4 /hour and the total RESPIRATORY DISTURBANCE INDEX was 1.4 /hour.  0 events occurred in REM sleep and 16 events in NREM. The REM AHI was 0 /hour, versus a non-REM AHI of 1.4. The patient spent 270 minutes of total sleep time in the supine position and 70 minutes in non-supine.. The supine AHI was /h 1.1 versus a non-supine AHI of 2.6/h.  OXYGEN SATURATION & C02:  The Wake baseline 02 saturation was 95%, with the lowest being 92%. Time spent below 89% saturation equaled 0 minutes.  AROUSALS:  The arousals were noted as: 92 were spontaneous, 0 were associated with PLMs, 9 were associated with respiratory events.  PLMs: The patient had a total of 0 Periodic Limb Movements.  The Periodic Limb Movement (PLM) index was 0 and the PLM Arousal index was 0/hour. Audio and video analysis did not show any abnormal or unusual movements, complex behaviors, phonations or vocalizations. She did not require any bathroom breaks. Intermittent mild to moderate snoring was noted. The EKG in normal sinus rhythm (NSR).  IMPRESSION:  1. Primary Snoring 2. Dysfunctions associated with sleep stages or arousals from sleep   RECOMMENDATIONS:  1. This study does not demonstrate any significant obstructive or central sleep disordered breathing. Intermittent mild to moderate snoring was noted; for this, weight loss and avoiding the supine sleep position may help.  2. This  study shows sleep fragmentation and abnormal sleep stage percentages; these are nonspecific findings and per se do not signify an intrinsic sleep disorder or a cause for the patient's sleep-related symptoms. Causes include (but are not limited to) the first night effect of the sleep study, circadian rhythm disturbances, medication effect or an underlying mood disorder or medical problem.  3. The patient should be cautioned not to drive, work at  heights, or operate dangerous or heavy equipment when tired or sleepy. Review and reiteration of good sleep hygiene measures should be pursued with any patient. 4. The patient will be advised to follow up with the referring provider, who will be notified of the test results.  I certify that I have reviewed the entire raw data recording prior to the issuance of this report in accordance with the Standards of Accreditation of the American Academy of Sleep Medicine (AASM)   Huston Foley, MD, PhD Diplomat, American Board of Neurology and Sleep Medicine (Neurology and Sleep Medicine)

## 2018-11-10 NOTE — Telephone Encounter (Signed)
-----   Message from Huston Foley, MD sent at 11/10/2018  7:42 AM EST ----- Patient referred by Dr. Michiel Sites, seen by me on 10/01/18, diagnostic PSG on 11/06/18.   Please call and notify the patient that the recent sleep study did not show any significant obstructive sleep apnea. Intermittent mild to moderate snoring was noted; for this, weight loss and avoiding the supine sleep position may help. She achieved mostly light stage sleep, did not achieve any dream sleep. No specific underlying sleep d/o detected. I would recommend review of her medications again, as some of the sleepiness may stem from medication effect. She can FU with Dr. Katrinka Blazing and her PCP.  Please remind patient to try to maintain good sleep hygiene, which means: Keep a regular sleep and wake schedule and make enough time for sleep (7 1/2 to 8 1/2 hours for the average adult are recommended), try not to exercise or have a meal within 2 hours of your bedtime, try to keep your bedroom conducive for sleep, that is, cool and dark, without light distractors such as an illuminated alarm clock, and refrain from watching TV right before sleep or in the middle of the night and do not keep the TV or radio on during the night. If a nightlight is used, have it away from the visual field. Also, try not to use or play on electronic devices at bedtime, such as your cell phone, tablet PC or laptop. If you like to read at bedtime on an electronic device, try to dim the background light as much as possible. Do not eat in the middle of the night. Keep pets away from the bedroom environment. For stress relief, try meditation, deep breathing exercises (there are many books and CDs available), a white noise machine or fan can help to diffuse other noise distractors, such as traffic noise. Do not drink alcohol before bedtime, as it can disturb sleep and cause middle of the night awakenings. Never mix alcohol and sedating medications! Avoid narcotic pain medication close to  bedtime, as opioids/narcotics can suppress breathing drive and breathing effort.    Thanks,  Huston Foley, MD, PhD [facility])

## 2018-12-08 NOTE — Progress Notes (Signed)
Virtual Visit via Video Note  I connected with Margaret Deleon on 12/08/18 at  2:45 PM EDT by a video enabled telemedicine application and verified that I am speaking with the correct person using two identifiers.   I discussed the limitations of evaluation and management by telemedicine and the availability of in person appointments. The patient expressed understanding and agreed to proceed.  History of Present Illness: She is here for follow up of her chronic medical conditions.   Migraine headaches:  She takes propranolol daily and excedrin as needed.  She takes compazine on occasion for nausea associated with the migraines.  She sometimes get migraines related to muscle tightness in the neck and upper back, but taking a Flexeril usually will prevent that.  Overall her migraines are controlled.  She thinks she averages 1 migraine every 2 weeks.  Anxiety: She is taking sertraline daily as prescribed, but she did decrease the dose to 25 mg daily approximately 1 week ago. She takes the ativan as needed only.  She denies any side effects from the medication as far she knows, but does wonder if it is contributing to her fatigue. She feels her anxiety is well controlled even with the decreased dose, but would consider trying something different.  Fatigue:  She had a recent sleep study and did not have OSA.  Dr Frances Furbish advised good sleep hygiene.  She advised reviewing medication to see if they were contributing to her fatigue.  The only medication she takes on a daily basis that could possibly contribute to the fatigue at the sertraline and propranolol.  She would consider changing the sertraline, but at this point wants to stay on the propranolol because it has worked so well for her migraines and keeps her palpitations controlled.  Palpitations: She is taking propranolol daily.  She feels her palpitations are controlled with her current dose of propanolol.    Observations/Objective: Well-appearing  in no acute distress Mood and affect normal.  Behavior, judgment and thinking normal.  Assessment and Plan:  See Problem List for Assessment and Plan of chronic medical problems.  Follow Up Instructions:    I discussed the assessment and treatment plan with the patient. The patient was provided an opportunity to ask questions and all were answered. The patient agreed with the plan and demonstrated an understanding of the instructions.   The patient was advised to call back or seek an in-person evaluation if the symptoms worsen or if the condition fails to improve as anticipated.  She will follow-up in 6 months, sooner if needed  I provided 25 minutes of non-face-to-face time during this encounter.   Pincus Sanes, MD

## 2018-12-09 ENCOUNTER — Ambulatory Visit (INDEPENDENT_AMBULATORY_CARE_PROVIDER_SITE_OTHER): Admitting: Internal Medicine

## 2018-12-09 ENCOUNTER — Encounter: Payer: Self-pay | Admitting: Internal Medicine

## 2018-12-09 DIAGNOSIS — R002 Palpitations: Secondary | ICD-10-CM

## 2018-12-09 DIAGNOSIS — F419 Anxiety disorder, unspecified: Secondary | ICD-10-CM

## 2018-12-09 DIAGNOSIS — R5383 Other fatigue: Secondary | ICD-10-CM

## 2018-12-09 DIAGNOSIS — G43009 Migraine without aura, not intractable, without status migrainosus: Secondary | ICD-10-CM | POA: Diagnosis not present

## 2018-12-09 MED ORDER — DULOXETINE HCL 30 MG PO CPEP: 30.0000 mg | ORAL_CAPSULE | Freq: Every day | ORAL | 5 refills | Status: DC

## 2018-12-09 NOTE — Assessment & Plan Note (Signed)
Decrease sertraline to 25 mg daily over the past week Anxiety overall controlled ?  Sertraline causing increased fatigue, possible weight gain Given chronic muscle tightness/upper back we will try Cymbalta 30 mg daily and discontinue sertraline If anxiety not controlled she will let me know Can take Ativan as needed

## 2018-12-09 NOTE — Assessment & Plan Note (Signed)
?    Related to Zoloft-we will discontinue We will try Cymbalta, which may help with some of her chronic muscle tightness/pain She is trying to get better earlier and practicing good sleep hygiene Propranolol has been very successful in treating her migraines-discussed that if she wanted to try something else that was an option since this could also be contributing to her fatigue

## 2018-12-09 NOTE — Assessment & Plan Note (Signed)
Controlled with current dose of propranolol Continue

## 2018-12-09 NOTE — Assessment & Plan Note (Signed)
Taking propranolol daily, which controls migraines She does get headaches from neck tightness and upper back tightness, which can sometimes trigger migraine-taking Flexeril often prevents this For migraines takes Excedrin as needed, occasionally Compazine Overall controlled Continue above

## 2018-12-17 ENCOUNTER — Ambulatory Visit: Admitting: Family Medicine

## 2019-01-05 ENCOUNTER — Encounter: Payer: Self-pay | Admitting: Internal Medicine

## 2019-01-08 ENCOUNTER — Other Ambulatory Visit (INDEPENDENT_AMBULATORY_CARE_PROVIDER_SITE_OTHER)

## 2019-01-08 ENCOUNTER — Encounter: Payer: Self-pay | Admitting: Internal Medicine

## 2019-01-08 ENCOUNTER — Telehealth: Payer: Self-pay | Admitting: Internal Medicine

## 2019-01-08 ENCOUNTER — Other Ambulatory Visit: Payer: Self-pay

## 2019-01-08 DIAGNOSIS — R3915 Urgency of urination: Secondary | ICD-10-CM

## 2019-01-08 LAB — URINALYSIS
Bilirubin Urine: NEGATIVE
Hgb urine dipstick: NEGATIVE
Ketones, ur: NEGATIVE
Leukocytes,Ua: NEGATIVE
Nitrite: NEGATIVE
Specific Gravity, Urine: 1.01 (ref 1.000–1.030)
Total Protein, Urine: NEGATIVE
Urine Glucose: NEGATIVE
Urobilinogen, UA: 0.2 (ref 0.0–1.0)
pH: 6 (ref 5.0–8.0)

## 2019-01-08 NOTE — Telephone Encounter (Signed)
Margaret Deleon called back.  I have given her Dr. Lawerance Bach response on the urinalysis.  Margaret Deleon would like to know what the next step would be to find a diagnosis.

## 2019-01-08 NOTE — Telephone Encounter (Signed)
My Chart message sent

## 2019-01-09 LAB — URINE CULTURE
MICRO NUMBER:: 420132
Result:: NO GROWTH
SPECIMEN QUALITY:: ADEQUATE

## 2019-01-10 ENCOUNTER — Encounter: Payer: Self-pay | Admitting: Internal Medicine

## 2019-01-19 ENCOUNTER — Ambulatory Visit: Admitting: Family Medicine

## 2019-01-28 ENCOUNTER — Ambulatory Visit (INDEPENDENT_AMBULATORY_CARE_PROVIDER_SITE_OTHER): Admitting: Family Medicine

## 2019-01-28 ENCOUNTER — Encounter: Payer: Self-pay | Admitting: Family Medicine

## 2019-01-28 ENCOUNTER — Other Ambulatory Visit: Payer: Self-pay

## 2019-01-28 VITALS — BP 124/84 | HR 71 | Ht 67.0 in | Wt 178.0 lb

## 2019-01-28 DIAGNOSIS — M5416 Radiculopathy, lumbar region: Secondary | ICD-10-CM | POA: Diagnosis not present

## 2019-01-28 DIAGNOSIS — M999 Biomechanical lesion, unspecified: Secondary | ICD-10-CM

## 2019-01-28 NOTE — Assessment & Plan Note (Signed)
Likely no radicular symptoms at this time.  Patient is still working on core strength.  Discussed with patient in great length.  Patient's neck pain does give some of her migrainous headaches and does have cervicogenic type pain symptoms.  Has responded well to osteopathic manipulation.  Encourage her to continue what she is doing at this time.  Patient seems to be in a good state mentally.  Follow-up again 4 to 6 weeks

## 2019-01-28 NOTE — Patient Instructions (Signed)
Good to see you  Ice is your friend One knee down one knee up tilt pelvis forward.  Hands overhead then rotate to upper leg.  Hold 10 seconds, relax, repeat and do other side as well.  Very important when after being in a flexed position for a long amount of time.  Stay active See you again in 5-6 weeks

## 2019-01-28 NOTE — Progress Notes (Signed)
Margaret ScaleZach Aayliah Rotenberry D.O.  Sports Medicine [address] [place], [place] [address] Phone: [phone] Subjective:   Margaret Deleon, Margaret Deleon, am serving as a scribe for Dr. Antoine PrimasZachary Rickayla Deleon.   CC: Back and neck pain follow-up  OZH:[id number]:Subjective  Margaret ChristineRachel Deleon is a 38 y.o. female coming in with complaint of lower back pain. Is here for OMT to manage her back pain. Has not been able to come in due to Covid. Does have some tightness in her lower back and cervical spine.  Patient since then has been fairly active.  Has been trying to run more frequently.  Feels it gives her some mild increasing tightness.  Not really taking any medications regularly    Past Medical History:  Diagnosis Date  . Anxiety 09/17/2017  . Depression   . Elevated blood pressure reading 10/14/2017   Elevated blood pressure  . Hypertension   . Migraine headache 09/17/2017   Has been on propranolol and labetalol in the past Did not tolerate Topamax Takes Excedrin Migraine as needed, has not tried has tried Fioricet once  . Migraines   . Muscle tightness 09/17/2017  . Neck pain 09/17/2017  . Nonallopathic lesion of cervical region 10/06/2017  . Nonallopathic lesion of lumbosacral region 10/06/2017  . Nonallopathic lesion of thoracic region 10/06/2017  . Palpitations 09/17/2017   Palpitations   Past Surgical History:  Procedure Laterality Date  . BREAST ENHANCEMENT SURGERY    . CESAREAN SECTION     Social History   Socioeconomic History  . Marital status: Married    Spouse name: Not on file  . Number of children: 3  . Years of education: Not on file  . Highest education level: Not on file  Occupational History  . Not on file  Social Needs  . Financial resource strain: Not on file  . Food insecurity:    Worry: Not on file    Inability: Not on file  . Transportation needs:    Medical: Not on file    Non-medical: Not on file  Tobacco Use  . Smoking status: Never Smoker  . Smokeless tobacco: Never Used  Substance and Sexual  Activity  . Alcohol use: Yes    Comment: occasionally   . Drug use: No  . Sexual activity: Yes  Lifestyle  . Physical activity:    Days per week: Not on file    Minutes per session: Not on file  . Stress: Not on file  Relationships  . Social connections:    Talks on phone: Not on file    Gets together: Not on file    Attends religious service: Not on file    Active member of club or organization: Not on file    Attends meetings of clubs or organizations: Not on file    Relationship status: Not on file  Other Topics Concern  . Not on file  Social History Narrative  . Not on file   Allergies  Allergen Reactions  . Codeine Nausea And Vomiting  . Topamax [Topiramate]     Felt weird, high like and tired   Family History  Problem Relation Age of Onset  . Depression Mother   . Cancer Father   . Alcohol abuse Brother   . Depression Brother   . Migraines Sister     Current Outpatient Medications (Endocrine & Metabolic):  .  levothyroxine (SYNTHROID, LEVOTHROID) 25 MCG tablet, Take 1 tablet (25 mcg total) by mouth daily.  Current Outpatient Medications (Cardiovascular):  .  propranolol ER (INDERAL LA) 60 MG 24 hr capsule, TAKE 1 CAPSULE(60 MG) BY MOUTH DAILY     Current Outpatient Medications (Other):  .  cyclobenzaprine (FLEXERIL) 10 MG tablet, Take 0.5-1 tablets (5-10 mg total) by mouth at bedtime. .  DULoxetine (CYMBALTA) 30 MG capsule, Take 1 capsule (30 mg total) by mouth daily. [name] Kitchen  gabapentin (NEURONTIN) 100 MG capsule, Take 2 capsules (200 mg total) by mouth at bedtime. [name] Kitchen  LORazepam (ATIVAN) 0.5 MG tablet, TAKE 1 TABLET(0.5 MG) BY MOUTH DAILY AS NEEDED FOR ANXIETY .  Multiple Vitamin (MULTIVITAMIN) tablet, Take 1 tablet by mouth daily. .  prochlorperazine (COMPAZINE) 10 MG tablet, Take 1 tablet (10 mg total) by mouth every 8 (eight) hours as needed for refractory nausea / vomiting (refractory headache).    Past medical history, social, surgical and family history  all reviewed in electronic medical record.  No pertanent information unless stated regarding to the chief complaint.   Review of Systems:  No headache, visual changes, nausea, vomiting, diarrhea, constipation, dizziness, abdominal pain, skin rash, fevers, chills, night sweats, weight loss, swollen lymph nodes, body aches, joint swelling, chest pain, shortness of breath, mood changes.  Positive muscle aches  Objective  Blood pressure 124/84, pulse 71, height 5\' 7"  (1.702 m), weight 178 lb (80.7 kg), SpO2 99 %.    General: No apparent distress alert and oriented x3 mood and affect normal, dressed appropriately.  HEENT: Pupils equal, extraocular movements intact  Respiratory: Patient's speak in full sentences and does not appear short of breath  Cardiovascular: No lower extremity edema, non tender, no erythema  Skin: Warm dry intact with no signs of infection or rash on extremities or on axial skeleton.  Abdomen: Soft nontender  Neuro: Cranial nerves II through XII are intact, neurovascularly intact in all extremities with 2+ DTRs and 2+ pulses.  Lymph: No lymphadenopathy of posterior or anterior cervical chain or axillae bilaterally.  Gait normal with good balance and coordination.  MSK:  Non tender with full range of motion and good stability and symmetric strength and tone of shoulders, elbows, wrist, hip, knee and ankles bilaterally.    Back - Normal skin, Spine with normal alignment and no deformity.  No tenderness to vertebral process palpation.  Paraspinous muscles are not tender and without spasm.   Range of motion is full at neck and lumbar sacral regions  Osteopathic findings C2 flexed rotated and side bent right C6 flexed rotated and side bent left T3 extended rotated and side bent right inhaled third rib T8 extended rotated and side bent left L4 flexed rotated and side bent left Sacrum right on right    Impression and Recommendations:     This case required medical decision  making of moderate complexity. The above documentation has been reviewed and is accurate and complete Judi Saa, DO       Note: This dictation was prepared with Dragon dictation along with smaller phrase technology. Any transcriptional errors that result from this process are unintentional.

## 2019-01-28 NOTE — Assessment & Plan Note (Signed)
Decision today to treat with OMT was based on Physical Exam  After verbal consent patient was treated with HVLA, ME, FPR techniques in cervical, thoracic, rib lumbar and sacral areas  Patient tolerated the procedure well with improvement in symptoms  Patient given exercises, stretches and lifestyle modifications  See medications in patient instructions if given  Patient will follow up in 4-6 weeks 

## 2019-02-12 IMAGING — DX DG LUMBAR SPINE COMPLETE 4+V
5 series · 5 of 5 positions shown · non-contrast
Comparison: None.

CLINICAL DATA: Chronic lower back pain with worsening since
[REDACTED]. Occasional left leg cramping. No injury.

EXAM:
LUMBAR SPINE - COMPLETE 4+ VIEW

[l-spine ap]
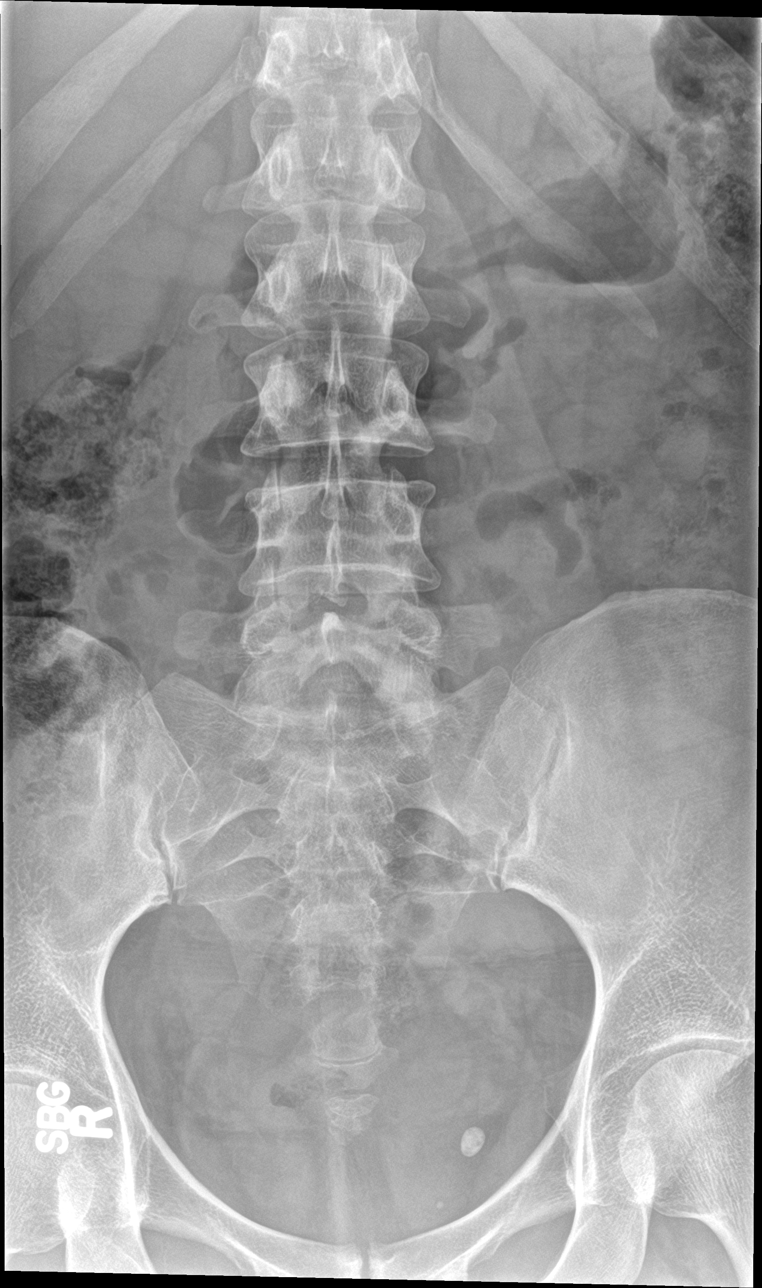

[l-spine obl (1 of 2)]
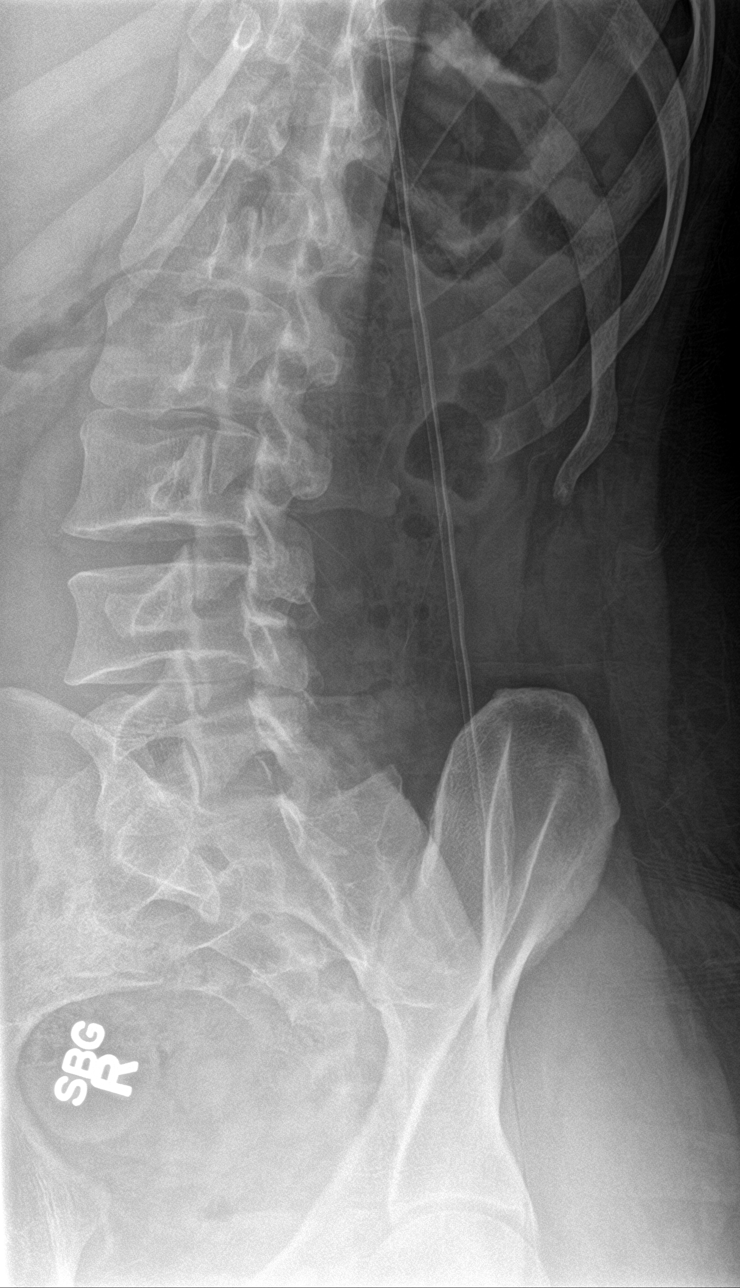

[l-spine obl (2 of 2)]
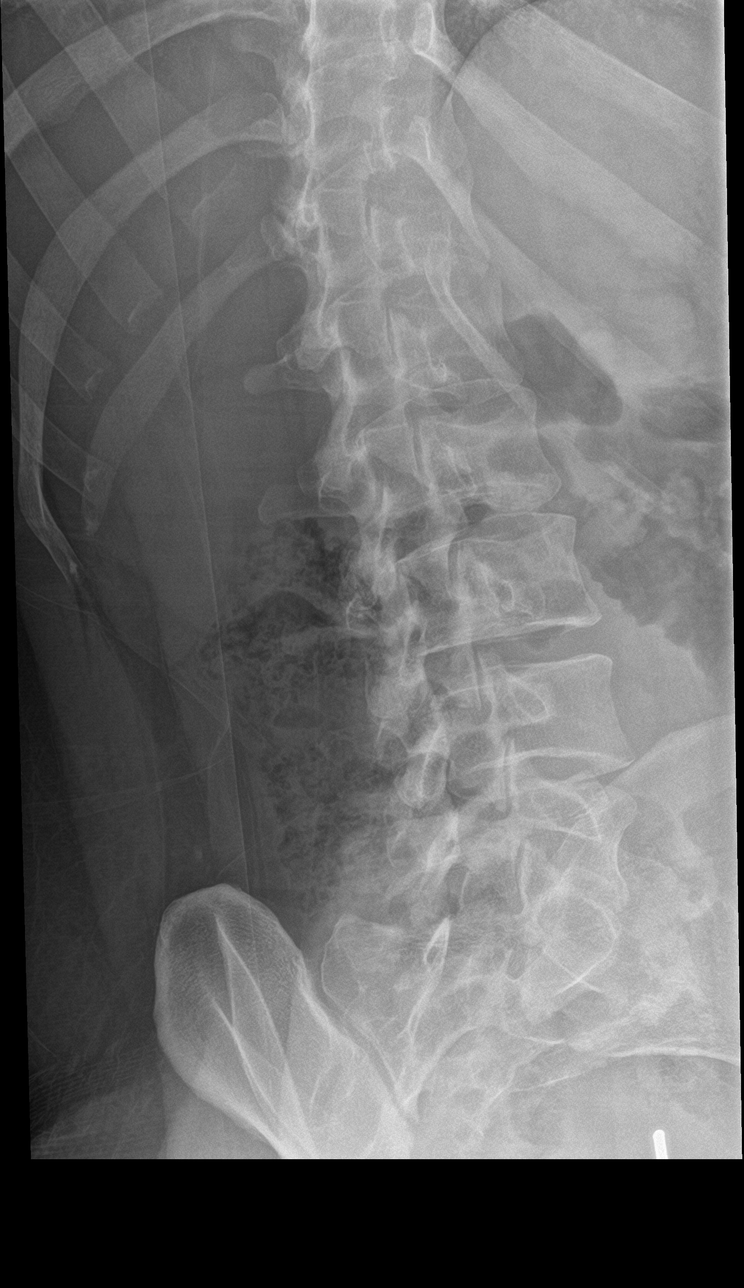

[l-spine lat]
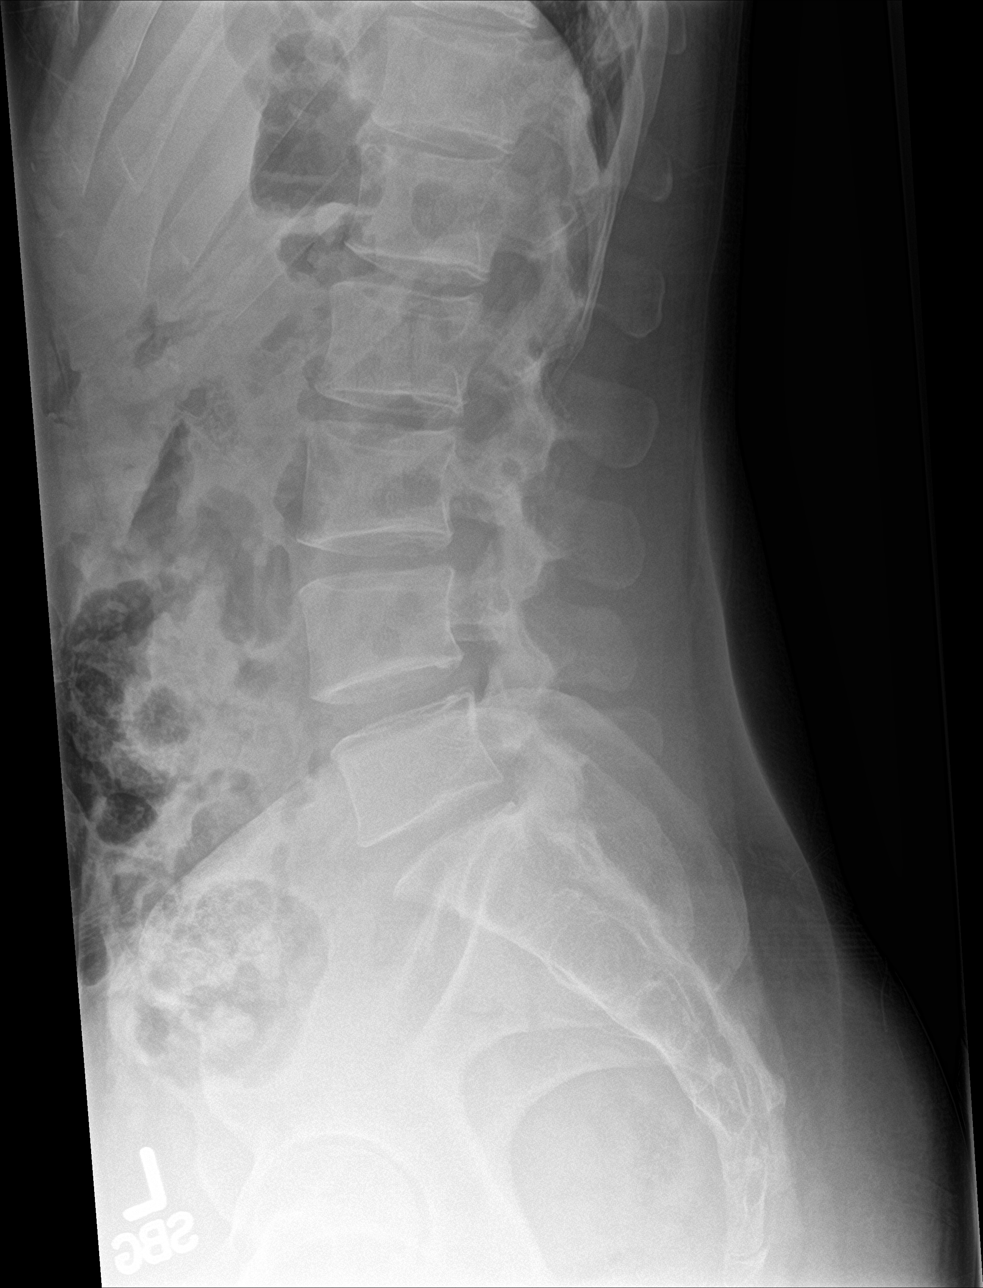

[l-spine spot]
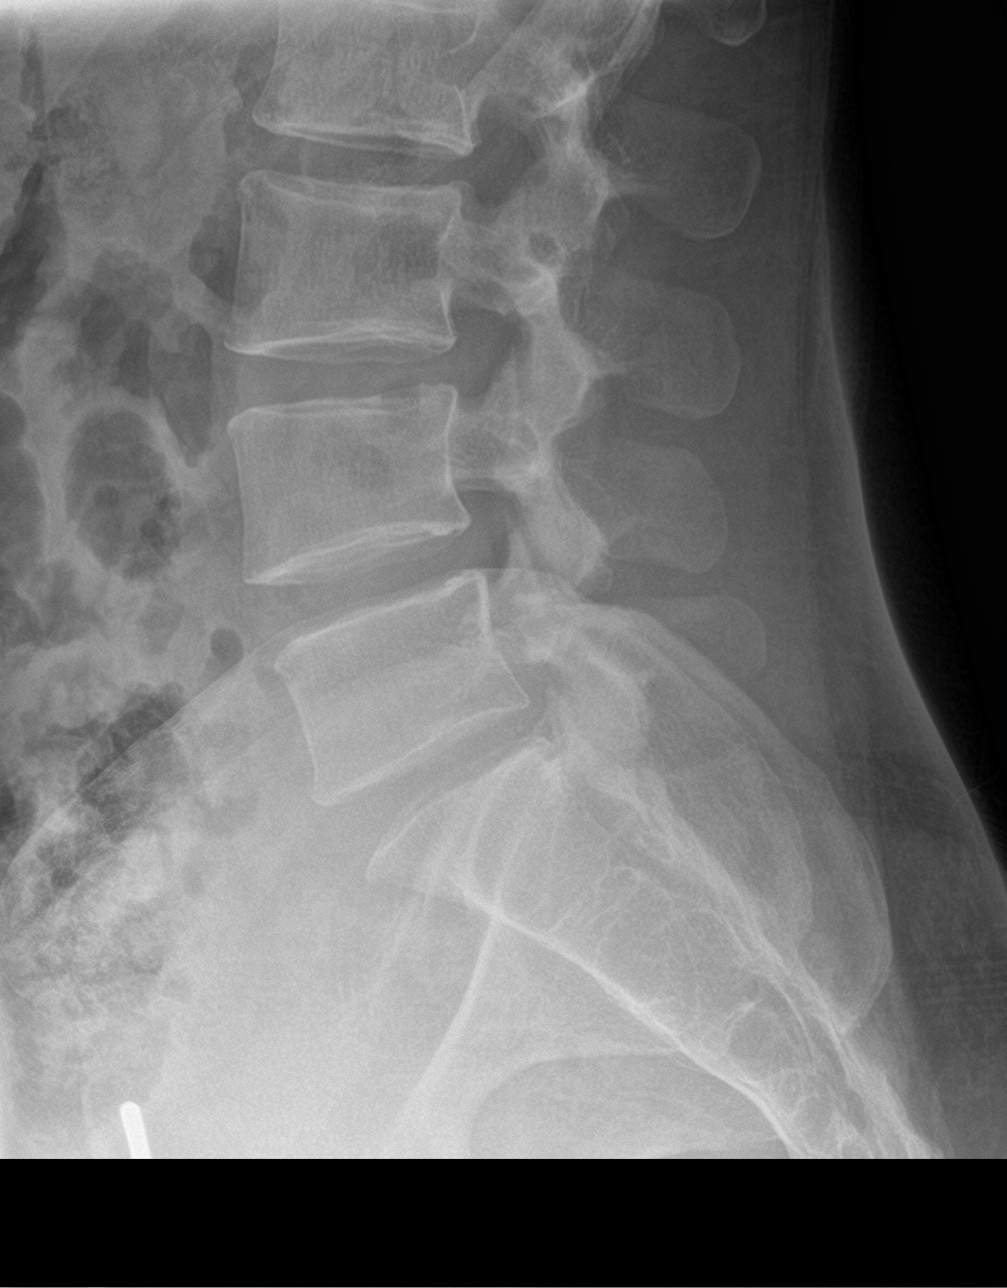

[5 of 5 positions shown; findings below may reference images not displayed]

FINDINGS: Alignment is anatomic. Vertebral body and disc space heights are
maintained. No degenerative changes. No definite pars defects.
IMPRESSION: No findings to explain the patient's symptoms.

## 2019-02-12 IMAGING — DX DG CERVICAL SPINE COMPLETE 4+V
5 series · 5 of 5 positions shown · non-contrast
Comparison: None.

CLINICAL DATA: Chronic cervicalgia

EXAM:
CERVICAL SPINE - COMPLETE 4+ VIEW

[c-spine lat]
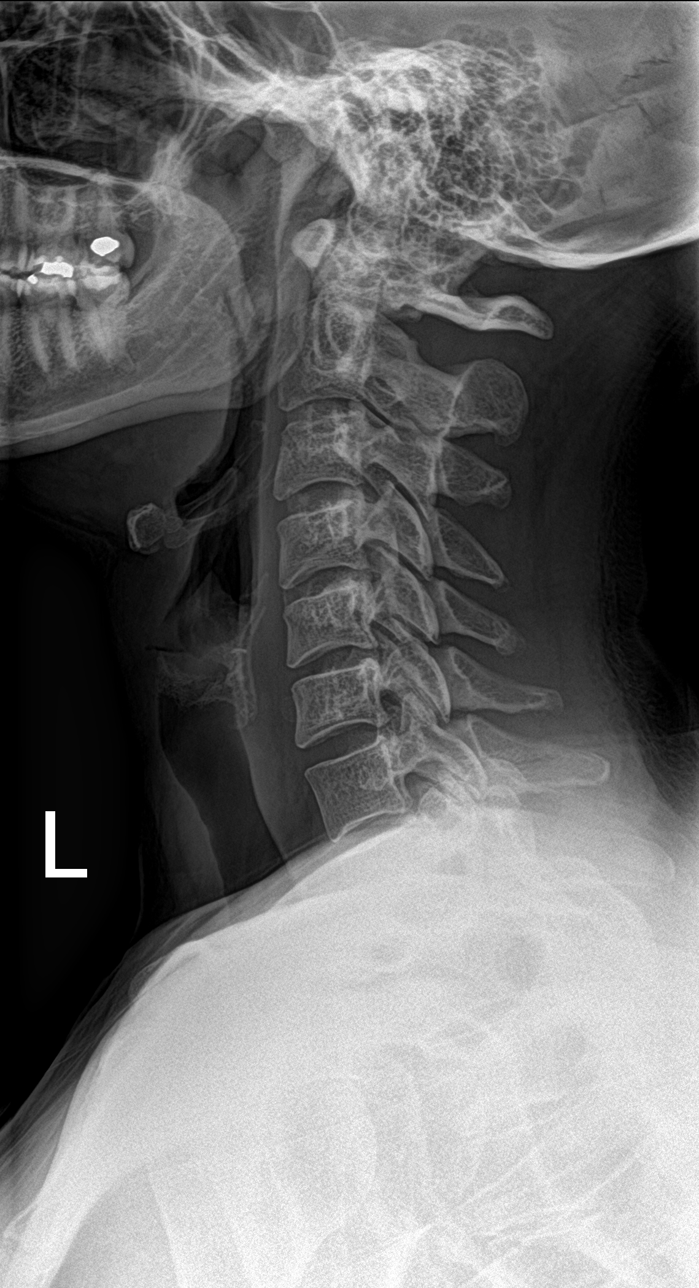

[c-spine obl (1 of 2)]
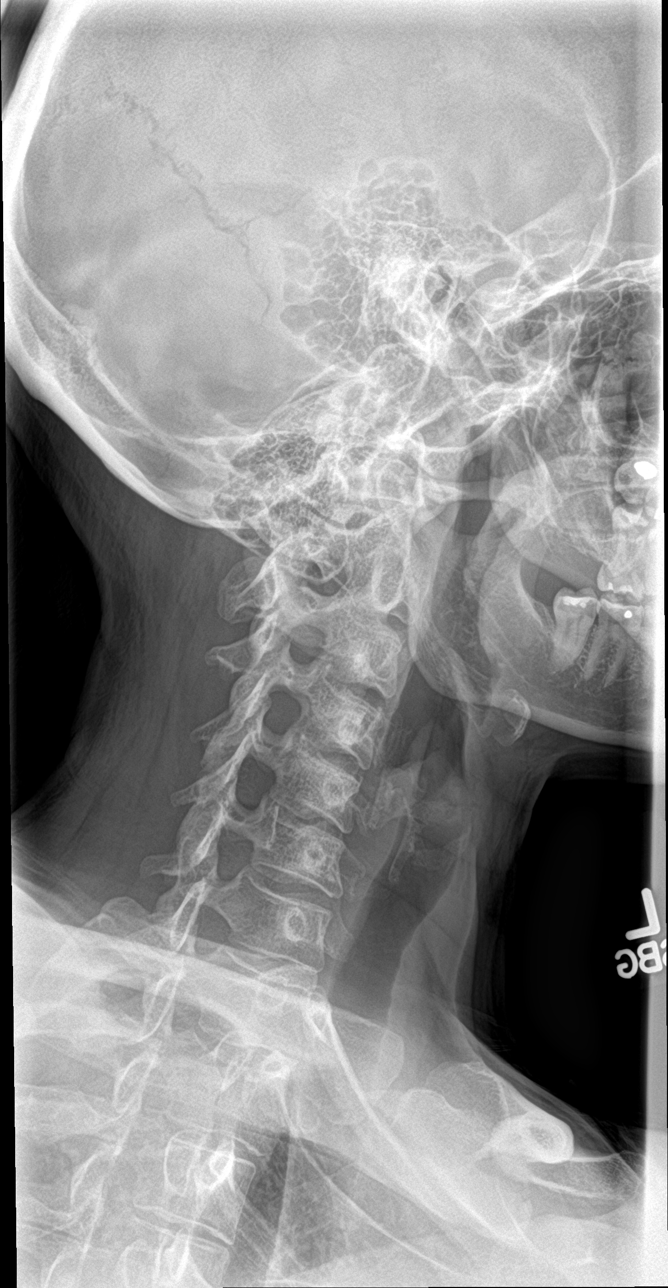

[c-spine obl (2 of 2)]
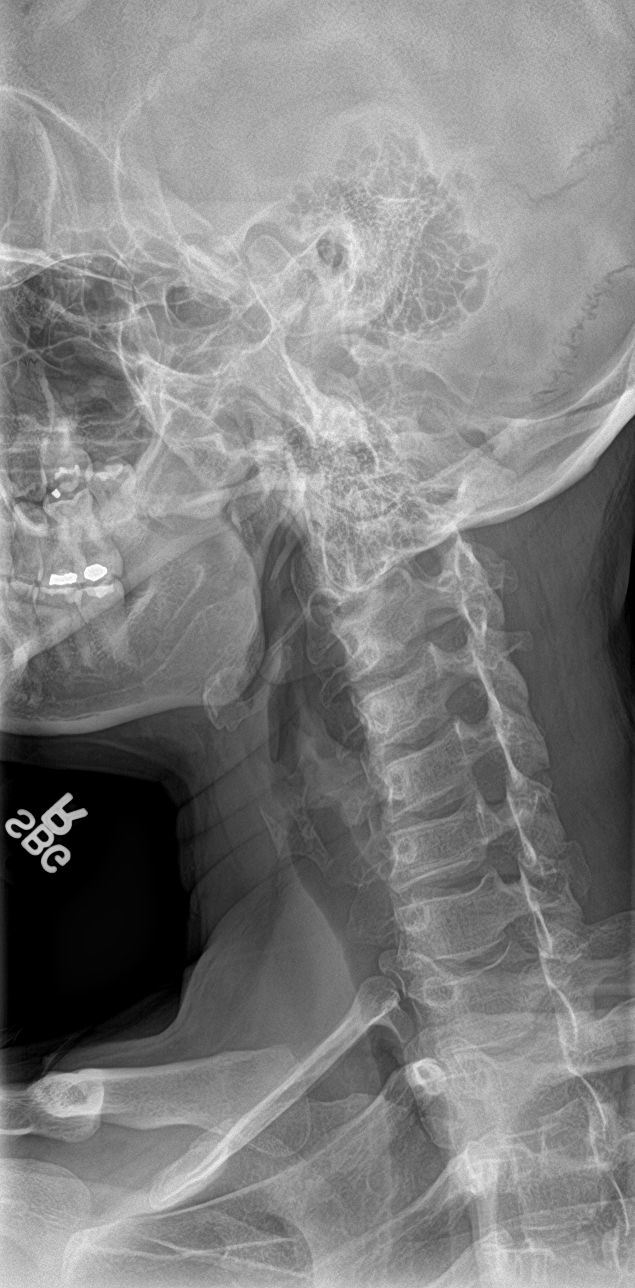

[c-spine ap]
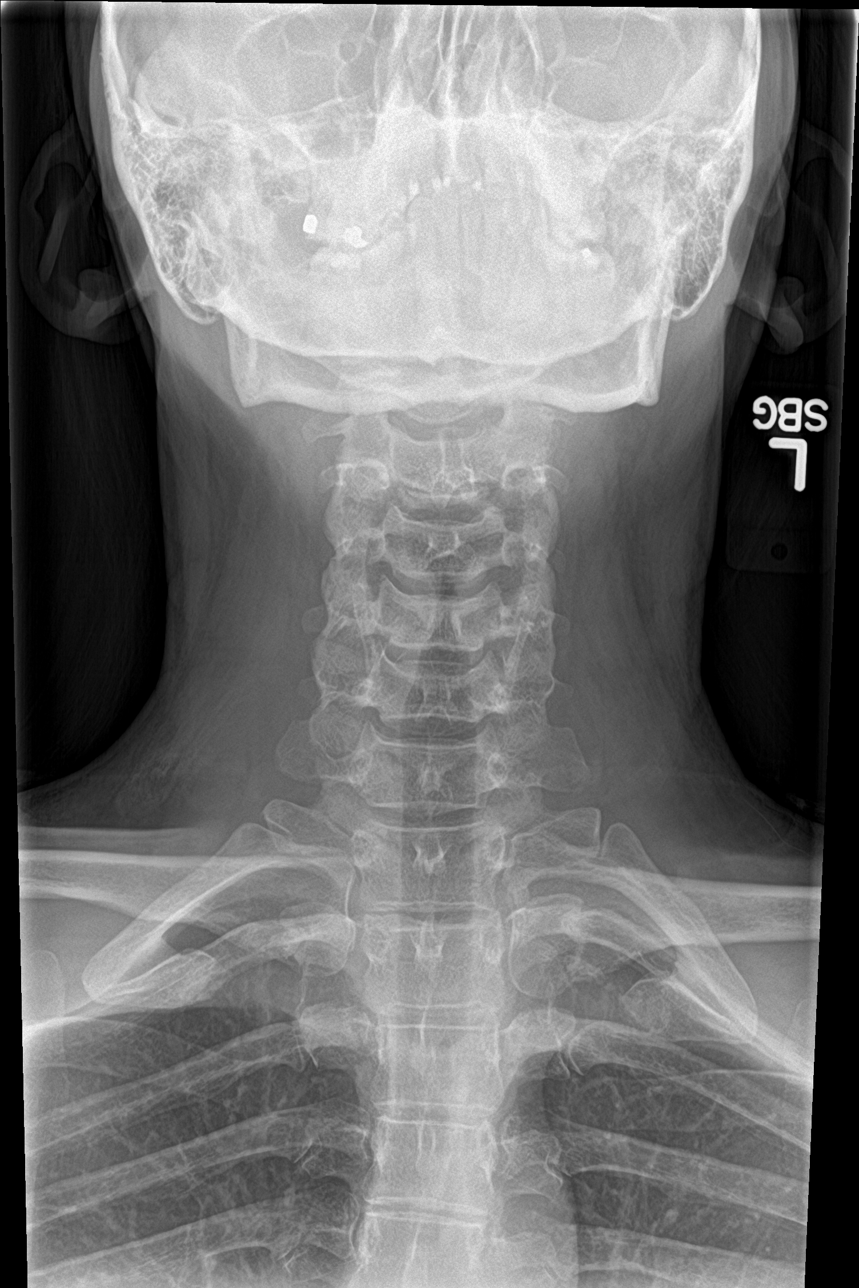

[c-spine open mouth]
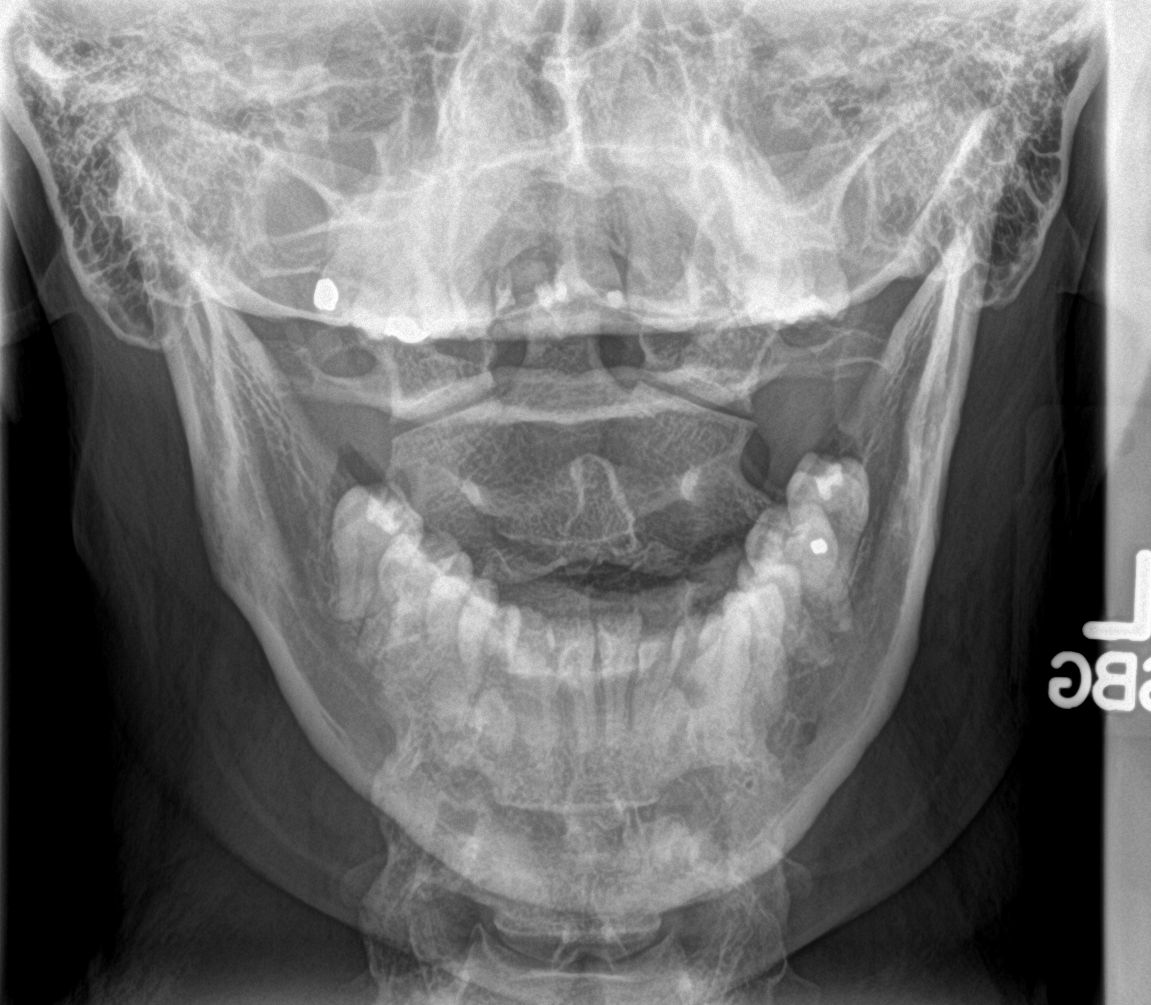

[5 of 5 positions shown; findings below may reference images not displayed]

FINDINGS: Frontal, lateral, open-mouth odontoid, and bilateral oblique views
were obtained. There is no fracture or spondylolisthesis.
Prevertebral soft tissues and predental space regions are normal.
There is slight disc space narrowing at C4-5 and C5-6. Other disc
spaces appear normal. There is no appreciable exit foraminal
narrowing on the oblique views.
IMPRESSION: Slight disc space narrowing at C5-5 and C5-6. No appreciable exit
foraminal narrowing the oblique views. No fracture or
spondylolisthesis.

## 2019-03-11 ENCOUNTER — Other Ambulatory Visit: Payer: Self-pay

## 2019-03-11 ENCOUNTER — Encounter: Payer: Self-pay | Admitting: Family Medicine

## 2019-03-11 ENCOUNTER — Ambulatory Visit (INDEPENDENT_AMBULATORY_CARE_PROVIDER_SITE_OTHER): Admitting: Family Medicine

## 2019-03-11 VITALS — BP 146/90 | HR 69 | Ht 67.0 in | Wt 179.0 lb

## 2019-03-11 DIAGNOSIS — M999 Biomechanical lesion, unspecified: Secondary | ICD-10-CM | POA: Diagnosis not present

## 2019-03-11 DIAGNOSIS — M5416 Radiculopathy, lumbar region: Secondary | ICD-10-CM

## 2019-03-11 NOTE — Assessment & Plan Note (Signed)
Likely no longer having any radicular symptoms.  Discussed this regimen and home exercise.  Discussed icing regimen.  Patient is encouraged to continue to work on core strengthening.  Follow-up again in 4 to 8 weeks

## 2019-03-11 NOTE — Patient Instructions (Addendum)
See me again in 6 weeks Enjoy the run When needed 3 Ibuprofen 3 times a day for 3 days

## 2019-03-11 NOTE — Progress Notes (Signed)
Margaret Deleon [facility] [place],  [address] Phone: [phone] Subjective:   I Margaret Deleon am serving as a Education administrator for Dr. Hulan Saas.  I'm seeing this patient by the request  of:    CC: Back pain and neck pain follow-up  [id number]  Margaret Deleon is a 38 y.o. female coming in with complaint of back pain. States she is sore and tight. Some tension headaches.  Patient started running.  Patient is doing some mild tightness.  Denies any other radiation no numbness.  Patient thinks exercise work if she would do it more regularly.  Doing well with the Cymbalta at this moment.  No side effects of medications     Past Medical History:  Diagnosis Date  . Anxiety 09/17/2017  . Depression   . Elevated blood pressure reading 10/14/2017   Elevated blood pressure  . Hypertension   . Migraine headache 09/17/2017   Has been on propranolol and labetalol in the past Did not tolerate Topamax Takes Excedrin Migraine as needed, has not tried has tried Fioricet once  . Migraines   . Muscle tightness 09/17/2017  . Neck pain 09/17/2017  . Nonallopathic lesion of cervical region 10/06/2017  . Nonallopathic lesion of lumbosacral region 10/06/2017  . Nonallopathic lesion of thoracic region 10/06/2017  . Palpitations 09/17/2017   Palpitations   Past Surgical History:  Procedure Laterality Date  . BREAST ENHANCEMENT SURGERY    . CESAREAN SECTION     Social History   Socioeconomic History  . Marital status: Married    Spouse name: Not on file  . Number of children: 3  . Years of education: Not on file  . Highest education level: Not on file  Occupational History  . Not on file  Social Needs  . Financial resource strain: Not on file  . Food insecurity    Worry: Not on file    Inability: Not on file  . Transportation needs    Medical: Not on file    Non-medical: Not on file  Tobacco Use  . Smoking status: Never Smoker  . Smokeless tobacco: Never Used   Substance and Sexual Activity  . Alcohol use: Yes    Comment: occasionally   . Drug use: No  . Sexual activity: Yes  Lifestyle  . Physical activity    Days per week: Not on file    Minutes per session: Not on file  . Stress: Not on file  Relationships  . Social Herbalist on phone: Not on file    Gets together: Not on file    Attends religious service: Not on file    Active member of club or organization: Not on file    Attends meetings of clubs or organizations: Not on file    Relationship status: Not on file  Other Topics Concern  . Not on file  Social History Narrative  . Not on file   Allergies  Allergen Reactions  . Codeine Nausea And Vomiting  . Topamax [Topiramate]     Felt weird, high like and tired   Family History  Problem Relation Age of Onset  . Depression Mother   . Cancer Father   . Alcohol abuse Brother   . Depression Brother   . Migraines Sister     Current Outpatient Medications (Endocrine & Metabolic):  .  levothyroxine (SYNTHROID, LEVOTHROID) 25 MCG tablet, Take 1 tablet (25 mcg total) by mouth daily.  Current Outpatient Medications (  Cardiovascular):  .  propranolol ER (INDERAL LA) 60 MG 24 hr capsule, TAKE 1 CAPSULE(60 MG) BY MOUTH DAILY     Current Outpatient Medications (Other):  .  cyclobenzaprine (FLEXERIL) 10 MG tablet, Take 0.5-1 tablets (5-10 mg total) by mouth at bedtime. .  DULoxetine (CYMBALTA) 30 MG capsule, Take 1 capsule (30 mg total) by mouth daily. [name] Kitchen.  gabapentin (NEURONTIN) 100 MG capsule, Take 2 capsules (200 mg total) by mouth at bedtime. [name] Kitchen.  LORazepam (ATIVAN) 0.5 MG tablet, TAKE 1 TABLET(0.5 MG) BY MOUTH DAILY AS NEEDED FOR ANXIETY .  Multiple Vitamin (MULTIVITAMIN) tablet, Take 1 tablet by mouth daily. .  prochlorperazine (COMPAZINE) 10 MG tablet, Take 1 tablet (10 mg total) by mouth every 8 (eight) hours as needed for refractory nausea / vomiting (refractory headache).    Past medical history, social, surgical  and family history all reviewed in electronic medical record.  No pertanent information unless stated regarding to the chief complaint.   Review of Systems:  No headache, visual changes, nausea, vomiting, diarrhea, constipation, dizziness, abdominal pain, skin rash, fevers, chills, night sweats, weight loss, swollen lymph nodes, body aches, joint swelling, muscle aches, chest pain, shortness of breath, mood changes.   Objective  Blood pressure (!) 146/90, pulse 69, height 5\' 7"  (1.702 m), weight 179 lb (81.2 kg), SpO2 98 %.    General: No apparent distress alert and oriented x3 mood and affect normal, dressed appropriately.  HEENT: Pupils equal, extraocular movements intact  Respiratory: Patient's speak in full sentences and does not appear short of breath  Cardiovascular: No lower extremity edema, non tender, no erythema  Skin: Warm dry intact with no signs of infection or rash on extremities or on axial skeleton.  Abdomen: Soft nontender  Neuro: Cranial nerves II through XII are intact, neurovascularly intact in all extremities with 2+ DTRs and 2+ pulses.  Lymph: No lymphadenopathy of posterior or anterior cervical chain or axillae bilaterally.  Gait normal with good balance and coordination.  MSK:  Non tender with full range of motion and good stability and symmetric strength and tone of shoulders, elbows, wrist, hip, knee and ankles bilaterally.    Back - Normal skin, Spine with normal alignment and no deformity.  Very mild loss of lordosis no tenderness to vertebral process palpation.  Paraspinous muscles are not tender and without spasm.   Range of motion is full at neck and lumbar sacral regions mild positive Pearlean BrownieFaber with pain on the right sacroiliac joint  Osteopathic findings   C6 flexed rotated and side bent left T3 extended rotated and side bent right inhaled third rib T9 extended rotated and side bent left L2 flexed rotated and side bent right Sacrum right on right     Impression and Recommendations:     This case required medical decision making of moderate complexity. The above documentation has been reviewed and is accurate and complete Judi SaaZachary M Aceyn Kathol, DO       Note: This dictation was prepared with Dragon dictation along with smaller phrase technology. Any transcriptional errors that result from this process are unintentional.

## 2019-03-11 NOTE — Assessment & Plan Note (Signed)
Decision today to treat with OMT was based on Physical Exam  After verbal consent patient was treated with HVLA, ME, FPR techniques in cervical, thoracic, rib,  lumbar and sacral areas  Patient tolerated the procedure well with improvement in symptoms  Patient given exercises, stretches and lifestyle modifications  See medications in patient instructions if given  Patient will follow up in 4-8 weeks 

## 2019-04-11 ENCOUNTER — Other Ambulatory Visit: Payer: Self-pay | Admitting: Internal Medicine

## 2019-04-14 ENCOUNTER — Encounter: Payer: Self-pay | Admitting: Internal Medicine

## 2019-04-14 ENCOUNTER — Other Ambulatory Visit: Payer: Self-pay

## 2019-04-14 ENCOUNTER — Ambulatory Visit (INDEPENDENT_AMBULATORY_CARE_PROVIDER_SITE_OTHER): Admitting: Internal Medicine

## 2019-04-14 VITALS — BP 136/84 | HR 76 | Temp 99.1°F | Resp 16 | Ht 67.0 in | Wt 181.0 lb

## 2019-04-14 DIAGNOSIS — R21 Rash and other nonspecific skin eruption: Secondary | ICD-10-CM | POA: Diagnosis not present

## 2019-04-14 MED ORDER — TRIAMCINOLONE ACETONIDE 0.5 % EX OINT: 1.0000 "application " | TOPICAL_OINTMENT | Freq: Two times a day (BID) | CUTANEOUS | 0 refills | Status: DC

## 2019-04-14 NOTE — Progress Notes (Signed)
Subjective:    Patient ID: Margaret Deleon, female    DOB: 10/23/1980, 38 y.o.   MRN: [id number]  HPI The patient is here for an acute visit.   Red patch on arm and eye:   Left axila rsh - it has been there for one month.  It stings and ictches intermittently.  It has peeled a little.  She has tried different deo, bendadryl cream, aquaphor.  Benadyryl burned.  It has gotten bigger.  She has not tried a steroid.    Red area on right eye lid:  It itches a little.  No pain.  This started one week ago.    She has not been wearing makeup.       Medications and allergies reviewed with patient and updated if appropriate.  Patient Active Problem List   Diagnosis Date Noted  . Lumbar radiculopathy, right 08/25/2018  . Vitamin D deficiency 06/10/2018  . Fatigue 06/10/2018  . Nonallopathic lesion of rib cage 04/01/2018  . Nonallopathic lesion of sacral region 04/01/2018  . Nonallopathic lesion of cervical region 10/06/2017  . Nonallopathic lesion of thoracic region 10/06/2017  . Nonallopathic lesion of lumbosacral region 10/06/2017  . Migraine headache 09/17/2017  . Anxiety 09/17/2017  . Neck pain 09/17/2017  . Muscle tightness 09/17/2017  . Palpitation 09/17/2017    Current Outpatient Medications on File Prior to Visit  Medication Sig Dispense Refill  . cyclobenzaprine (FLEXERIL) 10 MG tablet Take 0.5-1 tablets (5-10 mg total) by mouth at bedtime. 30 tablet 3  . DULoxetine (CYMBALTA) 30 MG capsule Take 1 capsule (30 mg total) by mouth daily. 30 capsule 5  . gabapentin (NEURONTIN) 100 MG capsule Take 2 capsules (200 mg total) by mouth at bedtime. 180 capsule 1  . levothyroxine (SYNTHROID, LEVOTHROID) 25 MCG tablet Take 1 tablet (25 mcg total) by mouth daily. 30 tablet 1  . LORazepam (ATIVAN) 0.5 MG tablet TAKE 1 TABLET(0.5 MG) BY MOUTH DAILY AS NEEDED FOR ANXIETY 10 tablet 0  . Multiple Vitamin (MULTIVITAMIN) tablet Take 1 tablet by mouth daily.    . prochlorperazine (COMPAZINE)  10 MG tablet Take 1 tablet (10 mg total) by mouth every 8 (eight) hours as needed for refractory nausea / vomiting (refractory headache). 15 tablet 0  . propranolol ER (INDERAL LA) 60 MG 24 hr capsule TAKE 1 CAPSULE(60 MG) BY MOUTH DAILY 90 capsule 1  . sertraline (ZOLOFT) 50 MG tablet TAKE 1 TABLET(50 MG) BY MOUTH AT BEDTIME 90 tablet 0   No current facility-administered medications on file prior to visit.     Past Medical History:  Diagnosis Date  . Anxiety 09/17/2017  . Depression   . Elevated blood pressure reading 10/14/2017   Elevated blood pressure  . Hypertension   . Migraine headache 09/17/2017   Has been on propranolol and labetalol in the past Did not tolerate Topamax Takes Excedrin Migraine as needed, has not tried has tried Fioricet once  . Migraines   . Muscle tightness 09/17/2017  . Neck pain 09/17/2017  . Nonallopathic lesion of cervical region 10/06/2017  . Nonallopathic lesion of lumbosacral region 10/06/2017  . Nonallopathic lesion of thoracic region 10/06/2017  . Palpitations 09/17/2017   Palpitations    Past Surgical History:  Procedure Laterality Date  . BREAST ENHANCEMENT SURGERY    . CESAREAN SECTION      Social History   Socioeconomic History  . Marital status: Married    Spouse name: Not on file  . Number of children: 3  .  Years of education: Not on file  . Highest education level: Not on file  Occupational History  . Not on file  Social Needs  . Financial resource strain: Not on file  . Food insecurity    Worry: Not on file    Inability: Not on file  . Transportation needs    Medical: Not on file    Non-medical: Not on file  Tobacco Use  . Smoking status: Never Smoker  . Smokeless tobacco: Never Used  Substance and Sexual Activity  . Alcohol use: Yes    Comment: occasionally   . Drug use: No  . Sexual activity: Yes  Lifestyle  . Physical activity    Days per week: Not on file    Minutes per session: Not on file  . Stress: Not on file   Relationships  . Social Musicianconnections    Talks on phone: Not on file    Gets together: Not on file    Attends religious service: Not on file    Active member of club or organization: Not on file    Attends meetings of clubs or organizations: Not on file    Relationship status: Not on file  Other Topics Concern  . Not on file  Social History Narrative  . Not on file    Family History  Problem Relation Age of Onset  . Depression Mother   . Cancer Father   . Alcohol abuse Brother   . Depression Brother   . Migraines Sister     Review of Systems  Constitutional: Negative for chills and fever.  Skin: Positive for color change and rash. Negative for wound.       Objective:   Vitals:   04/14/19 1432  BP: 136/84  Pulse: 76  Resp: 16  Temp: 99.1 F (37.3 C)  SpO2: 98%   BP Readings from Last 3 Encounters:  04/14/19 136/84  03/11/19 (!) 146/90  01/28/19 124/84   Wt Readings from Last 3 Encounters:  04/14/19 181 lb (82.1 kg)  03/11/19 179 lb (81.2 kg)  01/28/19 178 lb (80.7 kg)   Body mass index is 28.35 kg/m.   Physical Exam Constitutional:      General: She is not in acute distress.    Appearance: Normal appearance. She is not ill-appearing.  HENT:     Head: Normocephalic and atraumatic.  Skin:    General: Skin is warm and dry.     Comments: Small mildly erythematous dry patch on right upper eye lid w/o swelling or lesions.    Oval shaped patch of slightly raised erythematous region about 5 inch in length, slight peeling, slight tenderness, no central wound or lesion.   Neurological:     Mental Status: She is alert.            Assessment & Plan:    See Problem List for Assessment and Plan of chronic medical problems.

## 2019-04-14 NOTE — Assessment & Plan Note (Signed)
Two areas of rash -? Allergic or eczema  right eye lid - trial of aquaphor Left axilla - trial of triamcinolone - if this is not effective can try antibiotic  Call if no improvement

## 2019-04-14 NOTE — Patient Instructions (Signed)
Try the steroid cream twice daily on the rash under the armpit.  If there is no improvement let me know.    Try aquaphor for your eyelid.

## 2019-04-20 ENCOUNTER — Encounter: Payer: Self-pay | Admitting: Internal Medicine

## 2019-04-22 ENCOUNTER — Other Ambulatory Visit: Payer: Self-pay

## 2019-04-22 ENCOUNTER — Ambulatory Visit (INDEPENDENT_AMBULATORY_CARE_PROVIDER_SITE_OTHER): Admitting: Family Medicine

## 2019-04-22 ENCOUNTER — Encounter: Payer: Self-pay | Admitting: Family Medicine

## 2019-04-22 VITALS — BP 128/90 | HR 60 | Ht 67.0 in | Wt 180.0 lb

## 2019-04-22 DIAGNOSIS — M999 Biomechanical lesion, unspecified: Secondary | ICD-10-CM | POA: Diagnosis not present

## 2019-04-22 DIAGNOSIS — M542 Cervicalgia: Secondary | ICD-10-CM

## 2019-04-22 MED ORDER — VERAPAMIL HCL ER 120 MG PO TBCR: 120.0000 mg | EXTENDED_RELEASE_TABLET | Freq: Every day | ORAL | 5 refills | Status: DC

## 2019-04-22 NOTE — Progress Notes (Signed)
Tawana ScaleZach Yusif Gnau D.O.  Sports Medicine [address] [place], [place] [address] Phone: [phone] Subjective:   I Ronelle NighKana Thompson am serving as a Neurosurgeonscribe for Dr. Antoine PrimasZachary Llewellyn [place].  I'm seeing this patient by the request  of:    CC: Back pain follow-up  [id number]:Subjective  Margaret Deleon is a 38 y.o. female coming in with complaint of back pain. States this week her back has been tight. Lower back, left sided neck pain and shoulder pain.  Patient says still some tightness overall.  Still able to workout on a regular basis.  Has been walking more and feels like that has been contributing somewhat.  Patient states improvement though with anti-inflammatories when she takes them.    Past Medical History:  Diagnosis Date  . Anxiety 09/17/2017  . Depression   . Elevated blood pressure reading 10/14/2017   Elevated blood pressure  . Hypertension   . Migraine headache 09/17/2017   Has been on propranolol and labetalol in the past Did not tolerate Topamax Takes Excedrin Migraine as needed, has not tried has tried Fioricet once  . Migraines   . Muscle tightness 09/17/2017  . Neck pain 09/17/2017  . Nonallopathic lesion of cervical region 10/06/2017  . Nonallopathic lesion of lumbosacral region 10/06/2017  . Nonallopathic lesion of thoracic region 10/06/2017  . Palpitations 09/17/2017   Palpitations   Past Surgical History:  Procedure Laterality Date  . BREAST ENHANCEMENT SURGERY    . CESAREAN SECTION     Social History   Socioeconomic History  . Marital status: Married    Spouse name: Not on file  . Number of children: 3  . Years of education: Not on file  . Highest education level: Not on file  Occupational History  . Not on file  Social Needs  . Financial resource strain: Not on file  . Food insecurity    Worry: Not on file    Inability: Not on file  . Transportation needs    Medical: Not on file    Non-medical: Not on file  Tobacco Use  . Smoking status: Never Smoker  . Smokeless  tobacco: Never Used  Substance and Sexual Activity  . Alcohol use: Yes    Comment: occasionally   . Drug use: No  . Sexual activity: Yes  Lifestyle  . Physical activity    Days per week: Not on file    Minutes per session: Not on file  . Stress: Not on file  Relationships  . Social Musicianconnections    Talks on phone: Not on file    Gets together: Not on file    Attends religious service: Not on file    Active member of club or organization: Not on file    Attends meetings of clubs or organizations: Not on file    Relationship status: Not on file  Other Topics Concern  . Not on file  Social History Narrative  . Not on file   Allergies  Allergen Reactions  . Codeine Nausea And Vomiting  . Topamax [Topiramate]     Felt weird, high like and tired   Family History  Problem Relation Age of Onset  . Depression Mother   . Cancer Father   . Alcohol abuse Brother   . Depression Brother   . Migraines Sister     Current Outpatient Medications (Endocrine & Metabolic):  .  levothyroxine (SYNTHROID, LEVOTHROID) 25 MCG tablet, Take 1 tablet (25 mcg total) by mouth daily.  Current Outpatient Medications (Cardiovascular):  .  verapamil (CALAN-SR) 120 MG CR tablet, Take 1 tablet (120 mg total) by mouth at bedtime.     Current Outpatient Medications (Other):  .  cyclobenzaprine (FLEXERIL) 10 MG tablet, Take 0.5-1 tablets (5-10 mg total) by mouth at bedtime. .  gabapentin (NEURONTIN) 100 MG capsule, Take 2 capsules (200 mg total) by mouth at bedtime. [name] Kitchen  LORazepam (ATIVAN) 0.5 MG tablet, TAKE 1 TABLET(0.5 MG) BY MOUTH DAILY AS NEEDED FOR ANXIETY .  Multiple Vitamin (MULTIVITAMIN) tablet, Take 1 tablet by mouth daily. .  prochlorperazine (COMPAZINE) 10 MG tablet, Take 1 tablet (10 mg total) by mouth every 8 (eight) hours as needed for refractory nausea / vomiting (refractory headache). .  sertraline (ZOLOFT) 50 MG tablet, TAKE 1 TABLET(50 MG) BY MOUTH AT BEDTIME .  triamcinolone ointment  (KENALOG) 0.5 %, Apply 1 application topically 2 (two) times daily.    Past medical history, social, surgical and family history all reviewed in electronic medical record.  No pertanent information unless stated regarding to the chief complaint.   Review of Systems:  No headache, visual changes, nausea, vomiting, diarrhea, constipation, dizziness, abdominal pain, skin rash, fevers, chills, night sweats, weight loss, swollen lymph nodes, body aches, joint swelling, muscle aches, chest pain, shortness of breath, mood changes.   Objective  Blood pressure 128/90, pulse 60, height 5\' 7"  (1.702 m), weight 180 lb (81.6 kg), SpO2 99 %.     General: No apparent distress alert and oriented x3 mood and affect normal, dressed appropriately.  HEENT: Pupils equal, extraocular movements intact  Respiratory: Patient's speak in full sentences and does not appear short of breath  Cardiovascular: No lower extremity edema, non tender, no erythema  Skin: Warm dry intact with no signs of infection or rash on extremities or on axial skeleton.  Abdomen: Soft nontender  Neuro: Cranial nerves II through XII are intact, neurovascularly intact in all extremities with 2+ DTRs and 2+ pulses.  Lymph: No lymphadenopathy of posterior or anterior cervical chain or axillae bilaterally.  Gait normal with good balance and coordination.  MSK:  tender with full range of motion and good stability and symmetric strength and tone of shoulders, elbows, wrist, hip, knee and ankles bilaterally.  Neck exam mild tightness.  Patient does have some mild loss of lordosis.  Significant tightness in the parascapular region bilaterally right greater than left.  Tightness in the paraspinal musculature in the lumbar spine.  Tightness with Corky Sox but able to do both.  Negative straight leg test.  Osteopathic findings  C2 flexed rotated and side bent right C4 flexed rotated and side bent left C6 flexed rotated and side bent left T3 extended  rotated and side bent right inhaled third rib T9 extended rotated and side bent left L2 flexed rotated and side bent right Sacrum right on right    Impression and Recommendations:     This case required medical decision making of moderate complexity. The above documentation has been reviewed and is accurate and complete Lyndal Pulley, DO       Note: This dictation was prepared with Dragon dictation along with smaller phrase technology. Any transcriptional errors that result from this process are unintentional.

## 2019-04-22 NOTE — Assessment & Plan Note (Signed)
Multifactorial, patient does have more cervicogenic headaches as well as migraines.  Patient does have significant muscle tightness.  Patient has had laboratory and imaging done without any significant remarkable diagnoses.  We discussed icing regimen and home exercises, core strengthening and stability and finding time for self.  Patient will follow-up with me again in 4 to 8 weeks.

## 2019-04-22 NOTE — Assessment & Plan Note (Signed)
Decision today to treat with OMT was based on Physical Exam  After verbal consent patient was treated with HVLA, ME, FPR techniques in cervical, thoracic, rib lumbar and sacral areas  Patient tolerated the procedure well with improvement in symptoms  Patient given exercises, stretches and lifestyle modifications  See medications in patient instructions if given  Patient will follow up in 4-8 weeks 

## 2019-04-22 NOTE — Patient Instructions (Signed)
Try Ibuprofen for 3 days Keep trucking along See me again 6-8 weeks

## 2019-06-09 ENCOUNTER — Ambulatory Visit: Admitting: Family Medicine

## 2019-07-06 ENCOUNTER — Encounter: Payer: Self-pay | Admitting: Family Medicine

## 2019-07-06 ENCOUNTER — Other Ambulatory Visit: Payer: Self-pay

## 2019-07-06 ENCOUNTER — Ambulatory Visit (INDEPENDENT_AMBULATORY_CARE_PROVIDER_SITE_OTHER): Admitting: Family Medicine

## 2019-07-06 VITALS — BP 124/82 | HR 80 | Ht 67.0 in | Wt 175.0 lb

## 2019-07-06 DIAGNOSIS — F419 Anxiety disorder, unspecified: Secondary | ICD-10-CM

## 2019-07-06 DIAGNOSIS — Z8659 Personal history of other mental and behavioral disorders: Secondary | ICD-10-CM

## 2019-07-06 DIAGNOSIS — M999 Biomechanical lesion, unspecified: Secondary | ICD-10-CM | POA: Diagnosis not present

## 2019-07-06 DIAGNOSIS — M6289 Other specified disorders of muscle: Secondary | ICD-10-CM | POA: Diagnosis not present

## 2019-07-06 MED ORDER — HYDROXYZINE HCL 10 MG PO TABS: 10.0000 mg | ORAL_TABLET | Freq: Three times a day (TID) | ORAL | 0 refills | Status: DC | PRN

## 2019-07-06 NOTE — Patient Instructions (Addendum)
Good to see you See me again in 6-8 weeks 

## 2019-07-06 NOTE — Assessment & Plan Note (Signed)
Decision today to treat with OMT was based on Physical Exam  After verbal consent patient was treated with HVLA, ME, FPR techniques in cervical, thoracic, rib lumbar and sacral areas  Patient tolerated the procedure well with improvement in symptoms  Patient given exercises, stretches and lifestyle modifications  See medications in patient instructions if given  Patient will follow up in 4-8 weeks 

## 2019-07-06 NOTE — Progress Notes (Signed)
[name] [address] [place], [place] [address] Phone: [phone] Subjective:   I [name] am serving as a Neurosurgeon for Dr. Antoine Primas.    CC: Neck and back pain follow-up  [id number]  Margaret Deleon is a 38 y.o. female coming in with complaint of back pain. Last seen on 04/22/2019 for OMT. Patient states she is sore and tight. Lower back is bothering her the most today.  Patient is coming off of some of her antidepressant medicines.  Patient is feeling like she is actually doing relatively well.  Hydroxyzine has been beneficial for when she feels overwhelmed.  Patient is wondering if she can be sent to a specialist to discuss still.      Past Medical History:  Diagnosis Date  . Anxiety 09/17/2017  . Depression   . Elevated blood pressure reading 10/14/2017   Elevated blood pressure  . Hypertension   . Migraine headache 09/17/2017   Has been on propranolol and labetalol in the past Did not tolerate Topamax Takes Excedrin Migraine as needed, has not tried has tried Fioricet once  . Migraines   . Muscle tightness 09/17/2017  . Neck pain 09/17/2017  . Nonallopathic lesion of cervical region 10/06/2017  . Nonallopathic lesion of lumbosacral region 10/06/2017  . Nonallopathic lesion of thoracic region 10/06/2017  . Palpitations 09/17/2017   Palpitations   Past Surgical History:  Procedure Laterality Date  . BREAST ENHANCEMENT SURGERY    . CESAREAN SECTION     Social History   Socioeconomic History  . Marital status: Married    Spouse name: Not on file  . Number of children: 3  . Years of education: Not on file  . Highest education level: Not on file  Occupational History  . Not on file  Social Needs  . Financial resource strain: Not on file  . Food insecurity    Worry: Not on file    Inability: Not on file  . Transportation needs    Medical: Not on file    Non-medical: Not on file  Tobacco Use  . Smoking status: Never Smoker  .  Smokeless tobacco: Never Used  Substance and Sexual Activity  . Alcohol use: Yes    Comment: occasionally   . Drug use: No  . Sexual activity: Yes  Lifestyle  . Physical activity    Days per week: Not on file    Minutes per session: Not on file  . Stress: Not on file  Relationships  . Social Musician on phone: Not on file    Gets together: Not on file    Attends religious service: Not on file    Active member of club or organization: Not on file    Attends meetings of clubs or organizations: Not on file    Relationship status: Not on file  Other Topics Concern  . Not on file  Social History Narrative  . Not on file   Allergies  Allergen Reactions  . Codeine Nausea And Vomiting  . Topamax [Topiramate]     Felt weird, high like and tired   Family History  Problem Relation Age of Onset  . Depression Mother   . Cancer Father   . Alcohol abuse Brother   . Depression Brother   . Migraines Sister     Current Outpatient Medications (Endocrine & Metabolic):  .  levothyroxine (SYNTHROID, LEVOTHROID) 25 MCG tablet, Take 1 tablet (25 mcg total) by mouth daily.  Current Outpatient Medications (Cardiovascular):  .  verapamil (CALAN-SR) 120 MG CR tablet, Take 1 tablet (120 mg total) by mouth at bedtime.     Current Outpatient Medications (Other):  .  cyclobenzaprine (FLEXERIL) 10 MG tablet, Take 0.5-1 tablets (5-10 mg total) by mouth at bedtime. .  gabapentin (NEURONTIN) 100 MG capsule, Take 2 capsules (200 mg total) by mouth at bedtime. [name] Kitchen  LORazepam (ATIVAN) 0.5 MG tablet, TAKE 1 TABLET(0.5 MG) BY MOUTH DAILY AS NEEDED FOR ANXIETY .  Multiple Vitamin (MULTIVITAMIN) tablet, Take 1 tablet by mouth daily. .  prochlorperazine (COMPAZINE) 10 MG tablet, Take 1 tablet (10 mg total) by mouth every 8 (eight) hours as needed for refractory nausea / vomiting (refractory headache). .  sertraline (ZOLOFT) 50 MG tablet, TAKE 1 TABLET(50 MG) BY MOUTH AT BEDTIME .  triamcinolone  ointment (KENALOG) 0.5 %, Apply 1 application topically 2 (two) times daily. .  hydrOXYzine (ATARAX/VISTARIL) 10 MG tablet, Take 1 tablet (10 mg total) by mouth 3 (three) times daily as needed.    Past medical history, social, surgical and family history all reviewed in electronic medical record.  No pertanent information unless stated regarding to the chief complaint.   Review of Systems:  No headache, visual changes, nausea, vomiting, diarrhea, constipation, dizziness, abdominal pain, skin rash, fevers, chills, night sweats, weight loss, swollen lymph nodes, body aches, joint swelling, muscle aches, chest pain, shortness of breath, mood changes.   Objective  Blood pressure 124/82, pulse 80, height 5\' 7"  (1.702 m), weight 175 lb (79.4 kg), SpO2 98 %.    General: No apparent distress alert and oriented x3 mood and affect normal, dressed appropriately.  HEENT: Pupils equal, extraocular movements intact  Respiratory: Patient's speak in full sentences and does not appear short of breath  Cardiovascular: No lower extremity edema, non tender, no erythema  Skin: Warm dry intact with no signs of infection or rash on extremities or on axial skeleton.  Abdomen: Soft nontender  Neuro: Cranial nerves II through XII are intact, neurovascularly intact in all extremities with 2+ DTRs and 2+ pulses.  Lymph: No lymphadenopathy of posterior or anterior cervical chain or axillae bilaterally.  Gait normal with good balance and coordination.  MSK:  Non tender with full range of motion and good stability and symmetric strength and tone of shoulders, elbows, wrist, hip, knee and ankles bilaterally.  Back Exam:  Inspection: Unremarkable  Motion: Flexion 35 deg, Extension 25 deg, Side Bending to 25 deg bilaterally,  Rotation to 35 deg bilaterally  SLR laying: Negative  XSLR laying: Negative  Palpable tenderness: Tender to palpation paraspinal musculature. FABER: negative. Sensory change: Gross sensation  intact to all lumbar and sacral dermatomes.  Reflexes: 2+ at both patellar tendons, 2+ at achilles tendons, Babinski's downgoing.  Strength at foot  Plantar-flexion: 5/5 Dorsi-flexion: 5/5 Eversion: 5/5 Inversion: 5/5  Leg strength  Quad: 5/5 Hamstring: 5/5 Hip flexor: 5/5 Hip abductors: 5/5  Gait unremarkable.  Osteopathic findings  C6 flexed rotated and side bent left T3 extended rotated and side bent right inhaled third rib T9 extended rotated and side bent left L2 flexed rotated and side bent right Sacrum right on right    Impression and Recommendations:     This case required medical decision making of moderate complexity. The above documentation has been reviewed and is accurate and complete Lyndal Pulley, DO       Note: This dictation was prepared with Dragon dictation along with smaller phrase technology. Any transcriptional errors that  result from this process are unintentional.

## 2019-07-06 NOTE — Assessment & Plan Note (Signed)
Sent to behavioral health

## 2019-07-06 NOTE — Assessment & Plan Note (Signed)
Stable, doing well. Discussed which activities to do  Patient was to increase activities.  Discussed which activities to do which wants to avoid.  Patient will increase activity as tolerated.  Follow-up again in 4 to 8 weeks

## 2019-07-26 ENCOUNTER — Encounter: Payer: Self-pay | Admitting: Internal Medicine

## 2019-07-27 MED ORDER — FLUOXETINE HCL 20 MG PO TABS: 20.0000 mg | ORAL_TABLET | Freq: Every day | ORAL | 3 refills | Status: DC

## 2019-07-27 NOTE — Addendum Note (Signed)
Addended by: Binnie Rail on: 07/27/2019 09:05 PM   Modules accepted: Orders

## 2019-08-05 ENCOUNTER — Encounter: Payer: Self-pay | Admitting: Internal Medicine

## 2019-08-06 MED ORDER — BUSPIRONE HCL 5 MG PO TABS: 5.0000 mg | ORAL_TABLET | Freq: Three times a day (TID) | ORAL | 3 refills | Status: DC

## 2019-08-06 NOTE — Addendum Note (Signed)
Addended by: Binnie Rail on: 08/06/2019 07:54 AM   Modules accepted: Orders

## 2019-08-16 ENCOUNTER — Other Ambulatory Visit: Payer: Self-pay | Admitting: Internal Medicine

## 2019-08-17 ENCOUNTER — Ambulatory Visit: Admitting: Family Medicine

## 2019-10-14 ENCOUNTER — Other Ambulatory Visit: Payer: Self-pay | Admitting: Internal Medicine

## 2019-10-15 ENCOUNTER — Encounter: Payer: Self-pay | Admitting: Internal Medicine

## 2019-10-15 DIAGNOSIS — F419 Anxiety disorder, unspecified: Secondary | ICD-10-CM

## 2019-10-17 MED ORDER — BUSPIRONE HCL 7.5 MG PO TABS: 7.5000 mg | ORAL_TABLET | Freq: Three times a day (TID) | ORAL | 3 refills | Status: DC

## 2019-10-17 MED ORDER — LORAZEPAM 0.5 MG PO TABS: ORAL_TABLET | ORAL | 0 refills | Status: DC

## 2019-10-26 ENCOUNTER — Encounter: Payer: Self-pay | Admitting: Internal Medicine

## 2019-11-09 ENCOUNTER — Other Ambulatory Visit: Payer: Self-pay | Admitting: Internal Medicine

## 2019-11-13 ENCOUNTER — Other Ambulatory Visit: Payer: Self-pay | Admitting: Internal Medicine

## 2019-11-15 ENCOUNTER — Other Ambulatory Visit: Payer: Self-pay | Admitting: Internal Medicine

## 2019-11-18 ENCOUNTER — Encounter: Payer: Self-pay | Admitting: Internal Medicine

## 2019-11-18 MED ORDER — CYCLOBENZAPRINE HCL 10 MG PO TABS: ORAL_TABLET | ORAL | 3 refills | Status: DC

## 2019-11-30 NOTE — Progress Notes (Signed)
Subjective:    Patient ID: Margaret Deleon, female    DOB: 07-15-1981, 39 y.o.   MRN: [id number]  HPI She is here for a physical exam.   Anxiety is still a big issue.  Work is very stressful, but hopefully that will be getting better.  She does run a few times a week and that really helps with her anxiety.  She is taking the sertraline again and it does help, but still has a lot of anxiety.  She did not tolerate the BuSpar.  She takes the Ativan only on a rare occasion.   She has hemorrhoids.  She also has an anal fissure.  She did discuss this with her gynecologist and he prescribed a steroid cream and suppositories for the hemorrhoids.  She still has the anal fissure and wonders if there is anything that would treat that.  Medications and allergies reviewed with patient and updated if appropriate.  Patient Active Problem List   Diagnosis Date Noted  . Rash and nonspecific skin eruption 04/14/2019  . Lumbar radiculopathy, right 08/25/2018  . Vitamin D deficiency 06/10/2018  . Fatigue 06/10/2018  . Nonallopathic lesion of rib cage 04/01/2018  . Nonallopathic lesion of sacral region 04/01/2018  . Nonallopathic lesion of cervical region 10/06/2017  . Nonallopathic lesion of thoracic region 10/06/2017  . Nonallopathic lesion of lumbosacral region 10/06/2017  . Migraine headache 09/17/2017  . Anxiety 09/17/2017  . Neck pain 09/17/2017  . Muscle tightness 09/17/2017  . Palpitation 09/17/2017    Current Outpatient Medications on File Prior to Visit  Medication Sig Dispense Refill  . cyclobenzaprine (FLEXERIL) 10 MG tablet Take 1/2 to 1 tablet (5 to 10 mg) by mouth at bedtime. 30 tablet 3  . gabapentin (NEURONTIN) 100 MG capsule Take 2 capsules (200 mg total) by mouth at bedtime. 180 capsule 1  . hydrOXYzine (ATARAX/VISTARIL) 10 MG tablet Take 1 tablet (10 mg total) by mouth 3 (three) times daily as needed. 30 tablet 0  . levothyroxine (SYNTHROID, LEVOTHROID) 25 MCG tablet Take 1  tablet (25 mcg total) by mouth daily. 30 tablet 1  . LORazepam (ATIVAN) 0.5 MG tablet TAKE 1 TABLET(0.5 MG) BY MOUTH DAILY AS NEEDED FOR ANXIETY 10 tablet 0  . Multiple Vitamin (MULTIVITAMIN) tablet Take 1 tablet by mouth daily.    . prochlorperazine (COMPAZINE) 10 MG tablet Take 1 tablet (10 mg total) by mouth every 8 (eight) hours as needed for refractory nausea / vomiting (refractory headache). 15 tablet 0  . sertraline (ZOLOFT) 50 MG tablet Take 1 tablet by mouth at bedtime. Needs office visit for more refills. 30 tablet 0  . triamcinolone ointment (KENALOG) 0.5 % Apply 1 application topically 2 (two) times daily. 30 g 0  . verapamil (CALAN-SR) 120 MG CR tablet Take 1 tablet by mouth at bedtime. Need office visit for more refills. 30 tablet 0   No current facility-administered medications on file prior to visit.    Past Medical History:  Diagnosis Date  . Anxiety 09/17/2017  . Depression   . Elevated blood pressure reading 10/14/2017   Elevated blood pressure  . Hypertension   . Migraine headache 09/17/2017   Has been on propranolol and labetalol in the past Did not tolerate Topamax Takes Excedrin Migraine as needed, has not tried has tried Fioricet once  . Migraines   . Muscle tightness 09/17/2017  . Neck pain 09/17/2017  . Nonallopathic lesion of cervical region 10/06/2017  . Nonallopathic lesion of lumbosacral region 10/06/2017  .  Nonallopathic lesion of thoracic region 10/06/2017  . Palpitations 09/17/2017   Palpitations    Past Surgical History:  Procedure Laterality Date  . BREAST ENHANCEMENT SURGERY    . CESAREAN SECTION      Social History   Socioeconomic History  . Marital status: Married    Spouse name: Not on file  . Number of children: 3  . Years of education: Not on file  . Highest education level: Not on file  Occupational History  . Not on file  Tobacco Use  . Smoking status: Never Smoker  . Smokeless tobacco: Never Used  Substance and Sexual Activity  . Alcohol  use: Yes    Comment: occasionally   . Drug use: No  . Sexual activity: Yes  Other Topics Concern  . Not on file  Social History Narrative  . Not on file   Social Determinants of Health   Financial Resource Strain:   . Difficulty of Paying Living Expenses:   Food Insecurity:   . Worried About Programme researcher, broadcasting/film/video in the Last Year:   . Barista in the Last Year:   Transportation Needs:   . Freight forwarder (Medical):   [name] Kitchen Lack of Transportation (Non-Medical):   Physical Activity:   . Days of Exercise per Week:   . Minutes of Exercise per Session:   Stress:   . Feeling of Stress :   Social Connections:   . Frequency of Communication with Friends and Family:   . Frequency of Social Gatherings with Friends and Family:   . Attends Religious Services:   . Active Member of Clubs or Organizations:   . Attends Banker Meetings:   [name] Kitchen Marital Status:     Family History  Problem Relation Age of Onset  . Depression Mother   . Cancer Father   . Alcohol abuse Brother   . Depression Brother   . Migraines Sister     Review of Systems  Constitutional: Negative for chills and fever.  Eyes: Negative for visual disturbance.  Respiratory: Negative for cough, shortness of breath and wheezing.   Cardiovascular: Negative for chest pain, palpitations and leg swelling.  Gastrointestinal: Positive for anal bleeding, constipation (mild) and nausea (with migraines). Negative for abdominal pain, blood in stool and diarrhea.       No gerd  Genitourinary: Negative for dysuria and hematuria.  Musculoskeletal: Positive for arthralgias, back pain and neck pain.  Skin: Negative for color change and rash.  Neurological: Positive for headaches.  Psychiatric/Behavioral: Positive for sleep disturbance. Negative for dysphoric mood. The patient is nervous/anxious.        Objective:   Vitals:   12/01/19 1527  BP: (!) 148/100  Pulse: 72  Resp: 16  Temp: 98.9 F (37.2 C)    SpO2: 99%   Filed Weights   12/01/19 1527  Weight: 182 lb (82.6 kg)   Body mass index is 28.51 kg/m.  BP Readings from Last 3 Encounters:  12/01/19 (!) 148/100  07/06/19 124/82  04/22/19 128/90    Wt Readings from Last 3 Encounters:  12/01/19 182 lb (82.6 kg)  07/06/19 175 lb (79.4 kg)  04/22/19 180 lb (81.6 kg)     Physical Exam Constitutional: She appears well-developed and well-nourished. No distress.  HENT:  Head: Normocephalic and atraumatic.  Right Ear: External ear normal. Normal ear canal and TM Left Ear: External ear normal.  Normal ear canal and TM Mouth/Throat: Oropharynx is clear and moist.  Eyes:  Conjunctivae and EOM are normal.  Neck: Neck supple. No tracheal deviation present. No thyromegaly present.  No carotid bruit  Cardiovascular: Normal rate, regular rhythm and normal heart sounds.   No murmur heard.  No edema. Pulmonary/Chest: Effort normal and breath sounds normal. No respiratory distress. She has no wheezes. She has no rales.  Breast: deferred, left areola round nipple slightly raised Abdominal: Soft. She exhibits no distension. There is no tenderness.  Lymphadenopathy: She has no cervical adenopathy.  Skin: Skin is warm and dry. She is not diaphoretic.  Psychiatric: She has a normal mood and affect. Her behavior is normal.        Assessment & Plan:   Physical exam: Screening blood work    ordered Immunizations   Up to date  Gyn  Up to date  - Dr Willeen Cass [facility] Exercise  Regular - running Weight  Working on weight loss Substance abuse  none  See Problem List for Assessment and Plan of chronic medical problems.     This visit occurred during the SARS-CoV-2 public health emergency.  Safety protocols were in place, including screening questions prior to the visit, additional usage of staff PPE, and extensive cleaning of exam room while observing appropriate contact time as indicated for disinfecting solutions.

## 2019-11-30 NOTE — Patient Instructions (Addendum)
Blood work was ordered.    All other Health Maintenance issues reviewed.   All recommended immunizations and age-appropriate screenings are up-to-date or discussed.  No immunization administered today.   Medications reviewed and updated.  Changes include :   none  Your prescription(s) have been submitted to your pharmacy. Please take as directed and contact our office if you believe you are having problem(s) with the medication(s).    Please followup in 6 months    Health Maintenance, Female Adopting a healthy lifestyle and getting preventive care are important in promoting health and wellness. Ask your health care provider about:  The right schedule for you to have regular tests and exams.  Things you can do on your own to prevent diseases and keep yourself healthy. What should I know about diet, weight, and exercise? Eat a healthy diet   Eat a diet that includes plenty of vegetables, fruits, low-fat dairy products, and lean protein.  Do not eat a lot of foods that are high in solid fats, added sugars, or sodium. Maintain a healthy weight Body mass index (BMI) is used to identify weight problems. It estimates body fat based on height and weight. Your health care provider can help determine your BMI and help you achieve or maintain a healthy weight. Get regular exercise Get regular exercise. This is one of the most important things you can do for your health. Most adults should:  Exercise for at least 150 minutes each week. The exercise should increase your heart rate and make you sweat (moderate-intensity exercise).  Do strengthening exercises at least twice a week. This is in addition to the moderate-intensity exercise.  Spend less time sitting. Even light physical activity can be beneficial. Watch cholesterol and blood lipids Have your blood tested for lipids and cholesterol at 38 years of age, then have this test every 5 years. Have your cholesterol levels checked more  often if:  Your lipid or cholesterol levels are high.  You are older than 38 years of age.  You are at high risk for heart disease. What should I know about cancer screening? Depending on your health history and family history, you may need to have cancer screening at various ages. This may include screening for:  Breast cancer.  Cervical cancer.  Colorectal cancer.  Skin cancer.  Lung cancer. What should I know about heart disease, diabetes, and high blood pressure? Blood pressure and heart disease  High blood pressure causes heart disease and increases the risk of stroke. This is more likely to develop in people who have high blood pressure readings, are of African descent, or are overweight.  Have your blood pressure checked: ? Every 3-5 years if you are 18-39 years of age. ? Every year if you are 40 years old or older. Diabetes Have regular diabetes screenings. This checks your fasting blood sugar level. Have the screening done:  Once every three years after age 40 if you are at a normal weight and have a low risk for diabetes.  More often and at a younger age if you are overweight or have a high risk for diabetes. What should I know about preventing infection? Hepatitis B If you have a higher risk for hepatitis B, you should be screened for this virus. Talk with your health care provider to find out if you are at risk for hepatitis B infection. Hepatitis C Testing is recommended for:  Everyone born from 1945 through 1965.  Anyone with known risk factors for hepatitis   C. Sexually transmitted infections (STIs)  Get screened for STIs, including gonorrhea and chlamydia, if: ? You are sexually active and are younger than 38 years of age. ? You are older than 38 years of age and your health care provider tells you that you are at risk for this type of infection. ? Your sexual activity has changed since you were last screened, and you are at increased risk for chlamydia  or gonorrhea. Ask your health care provider if you are at risk.  Ask your health care provider about whether you are at high risk for HIV. Your health care provider may recommend a prescription medicine to help prevent HIV infection. If you choose to take medicine to prevent HIV, you should first get tested for HIV. You should then be tested every 3 months for as long as you are taking the medicine. Pregnancy  If you are about to stop having your period (premenopausal) and you may become pregnant, seek counseling before you get pregnant.  Take 400 to 800 micrograms (mcg) of folic acid every day if you become pregnant.  Ask for birth control (contraception) if you want to prevent pregnancy. Osteoporosis and menopause Osteoporosis is a disease in which the bones lose minerals and strength with aging. This can result in bone fractures. If you are 65 years old or older, or if you are at risk for osteoporosis and fractures, ask your health care provider if you should:  Be screened for bone loss.  Take a calcium or vitamin D supplement to lower your risk of fractures.  Be given hormone replacement therapy (HRT) to treat symptoms of menopause. Follow these instructions at home: Lifestyle  Do not use any products that contain nicotine or tobacco, such as cigarettes, e-cigarettes, and chewing tobacco. If you need help quitting, ask your health care provider.  Do not use street drugs.  Do not share needles.  Ask your health care provider for help if you need support or information about quitting drugs. Alcohol use  Do not drink alcohol if: ? Your health care provider tells you not to drink. ? You are pregnant, may be pregnant, or are planning to become pregnant.  If you drink alcohol: ? Limit how much you use to 0-1 drink a day. ? Limit intake if you are breastfeeding.  Be aware of how much alcohol is in your drink. In the [place] one drink equals one 12 oz bottle of beer (355 mL), one 5 oz  glass of wine (148 mL), or one 1 oz glass of hard liquor (44 mL). General instructions  Schedule regular health, dental, and eye exams.  Stay current with your vaccines.  Tell your health care provider if: ? You often feel depressed. ? You have ever been abused or do not feel safe at home. Summary  Adopting a healthy lifestyle and getting preventive care are important in promoting health and wellness.  Follow your health care provider's instructions about healthy diet, exercising, and getting tested or screened for diseases.  Follow your health care provider's instructions on monitoring your cholesterol and blood pressure. This information is not intended to replace advice given to you by your health care provider. Make sure you discuss any questions you have with your health care provider. Document Revised: 08/26/2018 Document Reviewed: 08/26/2018 Elsevier Patient Education  2020 Elsevier Inc.  

## 2019-12-01 ENCOUNTER — Other Ambulatory Visit: Payer: Self-pay

## 2019-12-01 ENCOUNTER — Encounter: Payer: Self-pay | Admitting: Internal Medicine

## 2019-12-01 ENCOUNTER — Ambulatory Visit (INDEPENDENT_AMBULATORY_CARE_PROVIDER_SITE_OTHER): Admitting: Internal Medicine

## 2019-12-01 VITALS — BP 148/100 | HR 72 | Temp 98.9°F | Resp 16 | Ht 67.0 in | Wt 182.0 lb

## 2019-12-01 DIAGNOSIS — Z0000 Encounter for general adult medical examination without abnormal findings: Secondary | ICD-10-CM

## 2019-12-01 DIAGNOSIS — G43009 Migraine without aura, not intractable, without status migrainosus: Secondary | ICD-10-CM

## 2019-12-01 DIAGNOSIS — F419 Anxiety disorder, unspecified: Secondary | ICD-10-CM | POA: Diagnosis not present

## 2019-12-01 DIAGNOSIS — K602 Anal fissure, unspecified: Secondary | ICD-10-CM

## 2019-12-01 MED ORDER — NITROGLYCERIN 0.4 % RE OINT: TOPICAL_OINTMENT | RECTAL | 0 refills | Status: DC

## 2019-12-01 NOTE — Assessment & Plan Note (Signed)
She has had this for about a year, but has not had any treatment for it Discussed taking a stool softener daily to keep stools on the softer side.  Avoid straining Trial of nitroglycerin gel twice daily Can also try lidocaine gel, which she has at home If no improvement can consider nifedipine gel or referral to GI

## 2019-12-01 NOTE — Assessment & Plan Note (Signed)
Chronic Still having high anxiety Will be getting an appointment with a psychiatrist at [facility] will be calling today to follow-up when she can be seen Has been on several medications in the past that were not effective or she had side effects including Lexapro, Effexor, Cymbalta, Prozac and BuSpar Takes Ativan as needed only Discussed options at this point and she would like to continue the sertraline at her current dose for now Can consider trial of Paxil, which her sister does well with-she will let me know-it depends on when she is able to see psychiatry

## 2019-12-01 NOTE — Assessment & Plan Note (Addendum)
Chronic Taking verapamil for prevention Takes flexeril as needed for neck muscle tightness  Taking excedrin migraine prn Overall happy with current regimen-continue

## 2019-12-02 ENCOUNTER — Encounter: Payer: Self-pay | Admitting: Internal Medicine

## 2019-12-02 LAB — CBC WITH DIFFERENTIAL/PLATELET
Basophils Absolute: 0 10*3/uL (ref 0.0–0.1)
Basophils Relative: 0.5 % (ref 0.0–3.0)
Eosinophils Absolute: 0.1 10*3/uL (ref 0.0–0.7)
Eosinophils Relative: 0.8 % (ref 0.0–5.0)
HCT: 37.4 % (ref 36.0–46.0)
Hemoglobin: 12.8 g/dL (ref 12.0–15.0)
Lymphocytes Relative: 22.8 % (ref 12.0–46.0)
Lymphs Abs: 1.5 10*3/uL (ref 0.7–4.0)
MCHC: 34.2 g/dL (ref 30.0–36.0)
MCV: 97.8 fl (ref 78.0–100.0)
Monocytes Absolute: 0.5 10*3/uL (ref 0.1–1.0)
Monocytes Relative: 8 % (ref 3.0–12.0)
Neutro Abs: 4.5 10*3/uL (ref 1.4–7.7)
Neutrophils Relative %: 67.9 % (ref 43.0–77.0)
Platelets: 253 10*3/uL (ref 150.0–400.0)
RBC: 3.82 Mil/uL — ABNORMAL LOW (ref 3.87–5.11)
RDW: 12.4 % (ref 11.5–15.5)
WBC: 6.6 10*3/uL (ref 4.0–10.5)

## 2019-12-02 LAB — TSH: TSH: 1.44 u[IU]/mL (ref 0.35–4.50)

## 2019-12-02 LAB — COMPREHENSIVE METABOLIC PANEL
ALT: 16 U/L (ref 0–35)
AST: 20 U/L (ref 0–37)
Albumin: 4.5 g/dL (ref 3.5–5.2)
Alkaline Phosphatase: 47 U/L (ref 39–117)
BUN: 10 mg/dL (ref 6–23)
CO2: 29 mEq/L (ref 19–32)
Calcium: 9.3 mg/dL (ref 8.4–10.5)
Chloride: 103 mEq/L (ref 96–112)
Creatinine, Ser: 1.03 mg/dL (ref 0.40–1.20)
GFR: 60.03 mL/min (ref >60.00)
Glucose, Bld: 85 mg/dL (ref 70–99)
Potassium: 4.5 mEq/L (ref 3.5–5.1)
Sodium: 137 mEq/L (ref 135–145)
Total Bilirubin: 0.4 mg/dL (ref 0.2–1.2)
Total Protein: 7.3 g/dL (ref 6.0–8.3)

## 2019-12-02 LAB — LIPID PANEL
Cholesterol: 174 mg/dL (ref 0–200)
HDL: 66.9 mg/dL (ref >39.00)
LDL Cholesterol: 92 mg/dL (ref 0–99)
NonHDL: 107.55
Total CHOL/HDL Ratio: 3
Triglycerides: 76 mg/dL (ref 0.0–149.0)
VLDL: 15.2 mg/dL (ref 0.0–40.0)

## 2019-12-06 ENCOUNTER — Encounter: Payer: Self-pay | Admitting: Internal Medicine

## 2019-12-06 MED ORDER — PAROXETINE HCL 20 MG PO TABS: 20.0000 mg | ORAL_TABLET | Freq: Every day | ORAL | 5 refills | Status: DC

## 2019-12-07 ENCOUNTER — Ambulatory Visit (INDEPENDENT_AMBULATORY_CARE_PROVIDER_SITE_OTHER): Admitting: Family Medicine

## 2019-12-07 ENCOUNTER — Encounter: Payer: Self-pay | Admitting: Family Medicine

## 2019-12-07 ENCOUNTER — Other Ambulatory Visit: Payer: Self-pay

## 2019-12-07 VITALS — BP 128/88 | HR 82 | Ht 67.0 in | Wt 182.0 lb

## 2019-12-07 DIAGNOSIS — M999 Biomechanical lesion, unspecified: Secondary | ICD-10-CM

## 2019-12-07 DIAGNOSIS — M542 Cervicalgia: Secondary | ICD-10-CM | POA: Diagnosis not present

## 2019-12-07 NOTE — Progress Notes (Signed)
[facility] Phone: [phone] Subjective:   Fontaine No, am serving as a scribe for Dr. Hulan Saas. This visit occurred during the SARS-CoV-2 public health emergency.  Safety protocols were in place, including screening questions prior to the visit, additional usage of staff PPE, and extensive cleaning of exam room while observing appropriate contact time as indicated for disinfecting solutions.   I'm seeing this patient by the request  of:  Margaret Rail, MD  CC: Neck pain and low back pain follow-up  [id number]  Brityn Mastrogiovanni is a 39 y.o. female coming in with complaint of back pain. Last seen on 07/05/2020 for OMT. Patient states that she is sore and tight in her lower left lumbar spine. Also having trap tightness bilaterally. 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     Past Medical History:  Diagnosis Date  . Anxiety 09/17/2017  . Depression   . Elevated blood pressure reading 10/14/2017   Elevated blood pressure  . Hypertension   . Migraine headache 09/17/2017   Has been on propranolol and labetalol in the past Did not tolerate Topamax Takes Excedrin Migraine as needed, has not tried has tried Fioricet once  . Migraines   . Muscle tightness 09/17/2017  . Neck pain 09/17/2017  . Nonallopathic lesion of cervical region 10/06/2017  . Nonallopathic lesion of lumbosacral region 10/06/2017  . Nonallopathic lesion of thoracic region 10/06/2017  . Palpitations 09/17/2017   Palpitations   Past Surgical History:  Procedure Laterality Date  . BREAST ENHANCEMENT SURGERY    . CESAREAN SECTION     Social History   Socioeconomic History  . Marital status: Married    Spouse name: Not on file  . Number of children: 3  . Years of education: Not on file  . Highest education level: Not on file  Occupational History  . Not on file  Tobacco Use  . Smoking status:  Never Smoker  . Smokeless tobacco: Never Used  Substance and Sexual Activity  . Alcohol use: Yes    Comment: occasionally   . Drug use: No  . Sexual activity: Yes  Other Topics Concern  . Not on file  Social History Narrative  . Not on file   Social Determinants of Health   Financial Resource Strain:   . Difficulty of Paying Living Expenses:   Food Insecurity:   . Worried About Charity fundraiser in the Last Year:   . Arboriculturist in the Last Year:   Transportation Needs:   . Film/video editor (Medical):   [name] Kitchen Lack of Transportation (Non-Medical):   Physical Activity:   . Days of Exercise per Week:   . Minutes of Exercise per Session:   Stress:   . Feeling of Stress :   Social Connections:   . Frequency of Communication with Friends and Family:   . Frequency of Social Gatherings with Friends and Family:   . Attends Religious Services:   . Active Member of Clubs or Organizations:   . Attends Archivist Meetings:   [name] Kitchen Marital Status:    Allergies  Allergen Reactions  . Codeine Nausea And Vomiting  . Prozac [Fluoxetine Hcl]     Increased anxiety  . Topamax [Topiramate]     Felt weird, high like and tired   Family History  Problem Relation Age of Onset  . Depression Mother   .  Cancer Father   . Alcohol abuse Brother   . Depression Brother   . Migraines Sister     Current Outpatient Medications (Endocrine & Metabolic):  .  levothyroxine (SYNTHROID, LEVOTHROID) 25 MCG tablet, Take 1 tablet (25 mcg total) by mouth daily.  Current Outpatient Medications (Cardiovascular):  .  verapamil (CALAN-SR) 120 MG CR tablet, Take 1 tablet by mouth at bedtime. Need office visit for more refills.     Current Outpatient Medications (Other):  .  cyclobenzaprine (FLEXERIL) 10 MG tablet, Take 1/2 to 1 tablet (5 to 10 mg) by mouth at bedtime. .  gabapentin (NEURONTIN) 100 MG capsule, Take 2 capsules (200 mg total) by mouth at bedtime. .  hydrocortisone 2.5 %  cream, APPLY EXTERNALLY TO THE AFFECTED AREA TWICE DAILY AS NEEDED .  hydrOXYzine (ATARAX/VISTARIL) 10 MG tablet, Take 1 tablet (10 mg total) by mouth 3 (three) times daily as needed. [name] Kitchen  LORazepam (ATIVAN) 0.5 MG tablet, TAKE 1 TABLET(0.5 MG) BY MOUTH DAILY AS NEEDED FOR ANXIETY .  Multiple Vitamin (MULTIVITAMIN) tablet, Take 1 tablet by mouth daily. .  Nitroglycerin 0.4 % OINT, Apply twice daily to affect area .  PARoxetine (PAXIL) 20 MG tablet, Take 1 tablet (20 mg total) by mouth daily. .  prochlorperazine (COMPAZINE) 10 MG tablet, Take 1 tablet (10 mg total) by mouth every 8 (eight) hours as needed for refractory nausea / vomiting (refractory headache). .  triamcinolone ointment (KENALOG) 0.5 %, Apply 1 application topically 2 (two) times daily.   Reviewed prior external information including notes and imaging from  primary care provider As well as notes that were available from care everywhere and other healthcare systems.  Past medical history, social, surgical and family history all reviewed in electronic medical record.  No pertanent information unless stated regarding to the chief complaint.   Review of Systems:  No visual changes, nausea, vomiting, diarrhea, constipation, dizziness, abdominal pain, skin rash, fevers, chills, night sweats, weight loss, swollen lymph nodes, body aches, joint swelling, chest pain, shortness of breath, mood changes. POSITIVE muscle aches, headache  Objective  Blood pressure 128/88, pulse 82, height 5\' 7"  (1.702 m), weight 182 lb (82.6 kg), SpO2 99 %.   General: No apparent distress alert and oriented x3 mood and affect normal, dressed appropriately.  HEENT: Pupils equal, extraocular movements intact  Respiratory: Patient's speak in full sentences and does not appear short of breath  Cardiovascular: No lower extremity edema, non tender, no erythema  Neuro: Cranial nerves II through XII are intact, neurovascularly intact in all extremities with 2+ DTRs  and 2+ pulses.  Gait normal with good balance and coordination.  MSK:  Non tender with full range of motion and good stability and symmetric strength and tone of shoulders, elbows, wrist, hip, knee and ankles bilaterally.  Neck exam does have some loss of lordosis.  Tender to palpation in the paraspinal musculature.  Patient does have some mild loss of the sidebending bilaterally.  Tightness in the parascapular region as well as no spinous process tenderness.  Osteopathic findings C2 flexed rotated and side bent right C6 flexed rotated and side bent left T3 extended rotated and side bent right inhaled third rib T5 extended rotated and side bent left L2 flexed rotated and side bent right Sacrum right on right    Impression and Recommendations:     This case required medical decision making of moderate complexity. The above documentation has been reviewed and is accurate and complete , DO  Note: This dictation was prepared with Dragon dictation along with smaller phrase technology. Any transcriptional errors that result from this process are unintentional.

## 2019-12-07 NOTE — Assessment & Plan Note (Signed)
Chronic problem : Mild exacerbation  interventions previously, including medication management: Doing relatively well is different medications aspirin does seem to be some mild anxiety.  Gabapentin is helpful and encouraged her to continue to take it regularly and hydroxyzine for breakthrough   Interventions this visit: Attempted osteopathic manipulation We discussed with patient the importance ergonomics, home exercises, icing regimen, and over-the-counter natural products.   Future considerations but will be based on evaluation and next visit: Likely will continue with osteopathic manipulation and can always repeat imaging if necessary.    Return to clinic:

## 2019-12-07 NOTE — Patient Instructions (Signed)
See me again in 4 weeks  

## 2019-12-07 NOTE — Assessment & Plan Note (Signed)

## 2019-12-10 ENCOUNTER — Other Ambulatory Visit: Payer: Self-pay | Admitting: Internal Medicine

## 2019-12-12 ENCOUNTER — Other Ambulatory Visit: Payer: Self-pay | Admitting: Internal Medicine

## 2020-01-05 ENCOUNTER — Ambulatory Visit (INDEPENDENT_AMBULATORY_CARE_PROVIDER_SITE_OTHER): Admitting: Family Medicine

## 2020-01-05 ENCOUNTER — Encounter: Payer: Self-pay | Admitting: Family Medicine

## 2020-01-05 ENCOUNTER — Other Ambulatory Visit: Payer: Self-pay

## 2020-01-05 VITALS — BP 130/90 | HR 66 | Ht 67.0 in | Wt 183.0 lb

## 2020-01-05 DIAGNOSIS — M542 Cervicalgia: Secondary | ICD-10-CM | POA: Diagnosis not present

## 2020-01-05 DIAGNOSIS — M999 Biomechanical lesion, unspecified: Secondary | ICD-10-CM

## 2020-01-05 NOTE — Progress Notes (Signed)
[name] [address] [place] [address] Phone: [phone] Subjective:   I [name] am serving as a Neurosurgeon for Dr. Antoine Primas.  This visit occurred during the SARS-CoV-2 public health emergency.  Safety protocols were in place, including screening questions prior to the visit, additional usage of staff PPE, and extensive cleaning of exam room while observing appropriate contact time as indicated for disinfecting solutions.   I'm seeing this patient by the request  of:  Pincus Sanes, MD  CC: Neck pain and back pain follow-up  [id number]  Margaret Deleon is a 39 y.o. female coming in with complaint of back pain. Last seen on 12/07/2019 for OMT. Patient states she is feeling tight. Upper traps sore and tight. Lower back is sore as well.  Patient has been running fairly regularly.  Patient has fairly well with that.  Still some increased stress in life at the moment.      Past Medical History:  Diagnosis Date  . Anxiety 09/17/2017  . Depression   . Elevated blood pressure reading 10/14/2017   Elevated blood pressure  . Hypertension   . Migraine headache 09/17/2017   Has been on propranolol and labetalol in the past Did not tolerate Topamax Takes Excedrin Migraine as needed, has not tried has tried Fioricet once  . Migraines   . Muscle tightness 09/17/2017  . Neck pain 09/17/2017  . Nonallopathic lesion of cervical region 10/06/2017  . Nonallopathic lesion of lumbosacral region 10/06/2017  . Nonallopathic lesion of thoracic region 10/06/2017  . Palpitations 09/17/2017   Palpitations   Past Surgical History:  Procedure Laterality Date  . BREAST ENHANCEMENT SURGERY    . CESAREAN SECTION     Social History   Socioeconomic History  . Marital status: Married    Spouse name: Not on file  . Number of children: 3  . Years of education: Not on file  . Highest education level: Not on file  Occupational History  . Not on file  Tobacco Use   . Smoking status: Never Smoker  . Smokeless tobacco: Never Used  Substance and Sexual Activity  . Alcohol use: Yes    Comment: occasionally   . Drug use: No  . Sexual activity: Yes  Other Topics Concern  . Not on file  Social History Narrative  . Not on file   Social Determinants of Health   Financial Resource Strain:   . Difficulty of Paying Living Expenses:   Food Insecurity:   . Worried About Programme researcher, broadcasting/film/video in the Last Year:   . Barista in the Last Year:   Transportation Needs:   . Freight forwarder (Medical):   [name] Kitchen Lack of Transportation (Non-Medical):   Physical Activity:   . Days of Exercise per Week:   . Minutes of Exercise per Session:   Stress:   . Feeling of Stress :   Social Connections:   . Frequency of Communication with Friends and Family:   . Frequency of Social Gatherings with Friends and Family:   . Attends Religious Services:   . Active Member of Clubs or Organizations:   . Attends Banker Meetings:   [name] Kitchen Marital Status:    Allergies  Allergen Reactions  . Codeine Nausea And Vomiting  . Prozac [Fluoxetine Hcl]     Increased anxiety  . Topamax [Topiramate]     Felt weird, high like and tired   Family History  Problem Relation  Age of Onset  . Depression Mother   . Cancer Father   . Alcohol abuse Brother   . Depression Brother   . Migraines Sister     Current Outpatient Medications (Endocrine & Metabolic):  .  levothyroxine (SYNTHROID, LEVOTHROID) 25 MCG tablet, Take 1 tablet (25 mcg total) by mouth daily.  Current Outpatient Medications (Cardiovascular):  .  verapamil (CALAN-SR) 120 MG CR tablet, TAKE 1 TABLET BY MOUTH AT BEDTIME     Current Outpatient Medications (Other):  .  cyclobenzaprine (FLEXERIL) 10 MG tablet, Take 1/2 to 1 tablet (5 to 10 mg) by mouth at bedtime. .  gabapentin (NEURONTIN) 100 MG capsule, Take 2 capsules (200 mg total) by mouth at bedtime. .  hydrocortisone 2.5 % cream, APPLY EXTERNALLY  TO THE AFFECTED AREA TWICE DAILY AS NEEDED .  hydrOXYzine (ATARAX/VISTARIL) 10 MG tablet, Take 1 tablet (10 mg total) by mouth 3 (three) times daily as needed. [name] Kitchen  LORazepam (ATIVAN) 0.5 MG tablet, TAKE 1 TABLET(0.5 MG) BY MOUTH DAILY AS NEEDED FOR ANXIETY .  Multiple Vitamin (MULTIVITAMIN) tablet, Take 1 tablet by mouth daily. .  Nitroglycerin 0.4 % OINT, Apply twice daily to affect area .  PARoxetine (PAXIL) 20 MG tablet, Take 1 tablet (20 mg total) by mouth daily. .  prochlorperazine (COMPAZINE) 10 MG tablet, Take 1 tablet (10 mg total) by mouth every 8 (eight) hours as needed for refractory nausea / vomiting (refractory headache). .  triamcinolone ointment (KENALOG) 0.5 %, Apply 1 application topically 2 (two) times daily.   Reviewed prior external information including notes and imaging from  primary care provider As well as notes that were available from care everywhere and other healthcare systems.  Past medical history, social, surgical and family history all reviewed in electronic medical record.  No pertanent information unless stated regarding to the chief complaint.   Review of Systems:  No headache, visual changes, nausea, vomiting, diarrhea, constipation, dizziness, abdominal pain, skin rash, fevers, chills, night sweats, weight loss, swollen lymph nodes, body aches, joint swelling, chest pain, shortness of breath, mood changes. POSITIVE muscle aches  Objective  Blood pressure 130/90, pulse 66, height 5\' 7"  (1.702 m), weight 183 lb (83 kg), SpO2 98 %.   General: No apparent distress alert and oriented x3 mood and affect normal, dressed appropriately.  HEENT: Pupils equal, extraocular movements intact  Respiratory: Patient's speak in full sentences and does not appear short of breath  Cardiovascular: No lower extremity edema, non tender, no erythema  Neuro: Cranial nerves II through XII are intact, neurovascularly intact in all extremities with 2+ DTRs and 2+ pulses.  Gait  normal with good balance and coordination.  MSK:  Non tender with full range of motion and good stability and symmetric strength and tone of shoulders, elbows, wrist, hip, knee and ankles bilaterally.  Low back exam has some mild loss of lordosis.  Some tender to palpation in the paraspinal musculature that is diffuse.  No spinous process tenderness.  Neck exam has some mild increase in tightness noted.  Negative Spurling's.  Pain in the parascapular region.  Osteopathic findings  C2 flexed rotated and side bent right C4 flexed rotated and side bent left C6 flexed rotated and side bent left T3 extended rotated and side bent right inhaled third rib T9 extended rotated and side bent left L2 flexed rotated and side bent right Sacrum right on right    Impression and Recommendations:     This case required medical decision making of moderate  complexity. The above documentation has been reviewed and is accurate and complete Lyndal Pulley, DO       Note: This dictation was prepared with Dragon dictation along with smaller phrase technology. Any transcriptional errors that result from this process are unintentional.

## 2020-01-05 NOTE — Assessment & Plan Note (Signed)
Neck pain chronic, exacerbation.  Discussed medications including cyclobenzaprine, gabapentin, hydroxyzine.  Refills were appropriate.  Increase activity.  Follow-up again in 4 to 8 weeks

## 2020-01-05 NOTE — Assessment & Plan Note (Signed)

## 2020-01-05 NOTE — Patient Instructions (Addendum)
Good to see you Congrats! See me again in 6-8 weeks

## 2020-01-13 ENCOUNTER — Other Ambulatory Visit: Payer: Self-pay

## 2020-01-13 ENCOUNTER — Ambulatory Visit (INDEPENDENT_AMBULATORY_CARE_PROVIDER_SITE_OTHER): Admitting: Psychiatry

## 2020-01-13 ENCOUNTER — Encounter: Payer: Self-pay | Admitting: Psychiatry

## 2020-01-13 VITALS — BP 142/86 | HR 82 | Ht 67.0 in | Wt 180.0 lb

## 2020-01-13 DIAGNOSIS — F419 Anxiety disorder, unspecified: Secondary | ICD-10-CM

## 2020-01-13 DIAGNOSIS — G4700 Insomnia, unspecified: Secondary | ICD-10-CM

## 2020-01-13 DIAGNOSIS — F330 Major depressive disorder, recurrent, mild: Secondary | ICD-10-CM | POA: Diagnosis not present

## 2020-01-13 MED ORDER — GABAPENTIN 100 MG PO CAPS: 200.0000 mg | ORAL_CAPSULE | Freq: Every day | ORAL | 1 refills | Status: DC

## 2020-01-13 MED ORDER — TRAZODONE HCL 50 MG PO TABS: ORAL_TABLET | ORAL | 1 refills | Status: DC

## 2020-01-13 MED ORDER — PAROXETINE HCL 30 MG PO TABS: 30.0000 mg | ORAL_TABLET | Freq: Every day | ORAL | 1 refills | Status: DC

## 2020-01-13 NOTE — Progress Notes (Signed)
[facility] MD/PA/NP Initial Note  01/14/2020 4:32 PM Margaret Deleon  MRN:  [id number]  Chief Complaint:  Chief Complaint    Anxiety; Depression      HPI: Pt is a 39 yo female being seen for initial evaluation for anxiety and depression. Pt reports experiencing increased anxiety and depression in the context of job stress.  She reports that she has had periods of depression in the past. She reports that she has been on and off treatment for depression. Her PCP prescribed Paxil about a month ago and this seems to have helped some with depression and anxiety. Before starting Paxil she felt nervous and "about to jump out of my skin...like ants crawling around in my brain." She reports that she will anxiety experience muscle tension and migraines with stress. Has elevated BP when anxious and BP is normal otherwise.  She is not having panic attacks. Some rumination. She reports that she cares deeply about things and others and thinks about how things will impact others. She reports some catastrophic thinking. Denies intrusive thoughts. She reports that likes to re-write things if she has scribbled. Denies any obsessions or compulsions. She reports that she has some anxiety around people she does not know well. Some anxiety with making certain phone calls, such as when she has to make calls for her personal needs and has a fear of upsetting someone. Denies difficulty opening mail. Some anxiety with public speaking and this resolves once she gets started. She reports that she has always had test anxiety.   Reports that she has had some sadness in response to job situation. Has been having a difficult time sleeping. Waking up every morning at 4 am. Has difficulty falling asleep and will take Melatonin QHS. Estimates sleeping 4-5 hours and slightly more on the weekends with interruptions. Reports that she will think about different things at night. She reports long-standing nightmares and vivid dreams. She  reports that she used to have some lucid dreams. Appetite has been ok. She reports that she typically eats healthy foods. Has not noticed any wt gain with Paxil. She reports that her energy is low and this may be due to diminished sleep. She reports that she has to push herself to run at times, even though she enjoys it. She reports that she has some difficulty initiating things, "but once I get up to do it, I am fine." She reports that she can lose track of what she was doing and forget what she was about to do. Reports that she sometimes has difficulty multi-tasking. She reports some days she notices some diminished interests and this has been better with Paxil. Denies SI.   Denies periods of diminished sleep. Occasional bursts of energy that last no more than a few hours. Denies impulsive or risky behaviors. Reports occasional elevated moods and talking more that lasts for a few hours.   Denies AH or VH. Denies paranoia.  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   Father died of leukemia when she was 7 yo. Reports that her mother never disciplined until after father's death and then was a very firm disciplinarian. Has an older sister and 2 younger brothers. She does not talk with one of her brothers after he made her chose to communicate with him or her other brother.  She recalls feeling like, "you have to work to make someone love you."   She and her husband have been together 17 years.  She reports that her husband is a good father. Husband has not been deployed and went for training. Military wife and their family has moved frequently. Moved here 2 years ago. She enjoys running. Has 2 associates degree. Works as a Optician, dispensing. Has a 59 yo daughter who is doing well. Has an 74 yo son and a 67 yo daughter. She reports that her children are good students and doing well. Enjoys running and feels that it helps her mood and anxiety. Has done roller derby in the past.  Enjoys watching her daughter dance and daughter has done competitive dancing.   Past Psychiatric Medication Trials: Sertraline- felt tired. Gained weight. Prozac-increased anxiety Buspar- worsening depression Paxil Cymbalta Lexapro Effexor XR Wellbutrin- Took about 15 years ago.  Topamax-adverse reaction Gabapentin- Has helped some with pain. May have helped with sleep Hydroxyzine- helped some with anxiety and not sleep Propranolol- Took for migraines Ativan- Has not taken recently. Takes only as needed. Was helpful for severe anxiety and insomnia.  Visit Diagnosis:    ICD-10-CM   1. Anxiety  F41.9 PARoxetine (PAXIL) 30 MG tablet    gabapentin (NEURONTIN) 100 MG capsule  2. Mild episode of recurrent major depressive disorder (HCC)  F33.0 PARoxetine (PAXIL) 30 MG tablet  3. Insomnia, unspecified type  G47.00 traZODone (DESYREL) 50 MG tablet    Past Psychiatric History: Saw a therapist in the past in [place]. May have seen a psych provider once.   Past Medical History:  Past Medical History:  Diagnosis Date  . Anxiety 09/17/2017  . Depression   . Elevated blood pressure reading 10/14/2017   Elevated blood pressure  . Hypertension   . Migraine headache 09/17/2017   Has been on propranolol and labetalol in the past Did not tolerate Topamax Takes Excedrin Migraine as needed, has not tried has tried Fioricet once  . Migraines   . Muscle tightness 09/17/2017  . Neck pain 09/17/2017  . Nonallopathic lesion of cervical region 10/06/2017  . Nonallopathic lesion of lumbosacral region 10/06/2017  . Nonallopathic lesion of thoracic region 10/06/2017  . Palpitations 09/17/2017   Palpitations    Past Surgical History:  Procedure Laterality Date  . BREAST ENHANCEMENT SURGERY    . CESAREAN SECTION      Family Psychiatric History: Sister has responded well to Paxil.   Family History:  Family History  Problem Relation Age of Onset  . Depression Mother   . Cancer Father   . Alcohol  abuse Brother   . Depression Brother   . Migraines Sister   . Anxiety disorder Sister     Social History:  Social History   Socioeconomic History  . Marital status: Married    Spouse name: Not on file  . Number of children: 3  . Years of education: Not on file  . Highest education level: Not on file  Occupational History  . Not on file  Tobacco Use  . Smoking status: Never Smoker  . Smokeless tobacco: Never Used  Substance and Sexual Activity  . Alcohol use: Yes    Comment: occasionally   . Drug use: No  . Sexual activity: Yes  Other Topics Concern  . Not on file  Social History Narrative  . Not on file   Social Determinants of Health   Financial Resource Strain:   . Difficulty of Paying Living Expenses:   Food Insecurity:   . Worried About Charity fundraiser in the Last Year:   . [place] in the  Last Year:   Transportation Needs:   . Freight forwarder (Medical):   [name] Kitchen Lack of Transportation (Non-Medical):   Physical Activity:   . Days of Exercise per Week:   . Minutes of Exercise per Session:   Stress:   . Feeling of Stress :   Social Connections:   . Frequency of Communication with Friends and Family:   . Frequency of Social Gatherings with Friends and Family:   . Attends Religious Services:   . Active Member of Clubs or Organizations:   . Attends Banker Meetings:   [name] Kitchen Marital Status:     Allergies:  Allergies  Allergen Reactions  . Codeine Nausea And Vomiting  . Prozac [Fluoxetine Hcl]     Increased anxiety  . Topamax [Topiramate]     Felt weird, high like and tired    Metabolic Disorder Labs: No results found for: HGBA1C, MPG No results found for: PROLACTIN Lab Results  Component Value Date   CHOL 174 12/01/2019   TRIG 76.0 12/01/2019   HDL 66.90 12/01/2019   CHOLHDL 3 12/01/2019   VLDL 15.2 12/01/2019   LDLCALC 92 12/01/2019   Lab Results  Component Value Date   TSH 1.44 12/01/2019   TSH 1.00 06/16/2018     Therapeutic Level Labs: No results found for: LITHIUM No results found for: VALPROATE No components found for:  CBMZ  Current Medications: Current Outpatient Medications  Medication Sig Dispense Refill  . cyclobenzaprine (FLEXERIL) 10 MG tablet Take 1/2 to 1 tablet (5 to 10 mg) by mouth at bedtime. 30 tablet 3  . Multiple Vitamin (MULTIVITAMIN) tablet Take 1 tablet by mouth daily.    [name] Kitchen PARoxetine (PAXIL) 30 MG tablet Take 1 tablet (30 mg total) by mouth daily. 30 tablet 1  . verapamil (CALAN-SR) 120 MG CR tablet TAKE 1 TABLET BY MOUTH AT BEDTIME 90 tablet 1  . gabapentin (NEURONTIN) 100 MG capsule Take 2 capsules (200 mg total) by mouth at bedtime. 180 capsule 1  . hydrocortisone 2.5 % cream APPLY EXTERNALLY TO THE AFFECTED AREA TWICE DAILY AS NEEDED    . hydrOXYzine (ATARAX/VISTARIL) 10 MG tablet Take 1 tablet (10 mg total) by mouth 3 (three) times daily as needed. (Patient not taking: Reported on 01/13/2020) 30 tablet 0  . levothyroxine (SYNTHROID, LEVOTHROID) 25 MCG tablet Take 1 tablet (25 mcg total) by mouth daily. (Patient not taking: Reported on 01/13/2020) 30 tablet 1  . LORazepam (ATIVAN) 0.5 MG tablet TAKE 1 TABLET(0.5 MG) BY MOUTH DAILY AS NEEDED FOR ANXIETY (Patient not taking: Reported on 01/13/2020) 10 tablet 0  . Nitroglycerin 0.4 % OINT Apply twice daily to affect area 30 g 0  . prochlorperazine (COMPAZINE) 10 MG tablet Take 1 tablet (10 mg total) by mouth every 8 (eight) hours as needed for refractory nausea / vomiting (refractory headache). 15 tablet 0  . traZODone (DESYREL) 50 MG tablet Take 1/2-2 tabs po QHS prn insomnia 60 tablet 1  . triamcinolone ointment (KENALOG) 0.5 % Apply 1 application topically 2 (two) times daily. 30 g 0   No current facility-administered medications for this visit.    Medication Side Effects: none  Orders placed this visit:  No orders of the defined types were placed in this encounter.   Psychiatric Specialty Exam:  Review of Systems   Constitutional: Positive for fatigue.  HENT: Negative.   Eyes: Negative.   Respiratory: Negative.   Cardiovascular: Negative.   Gastrointestinal: Negative.   Endocrine: Negative.   Genitourinary: Negative.  Musculoskeletal: Positive for back pain, myalgias and neck pain.  Skin: Negative.   Allergic/Immunologic: Negative.   Neurological: Positive for headaches.  Hematological: Negative.   Psychiatric/Behavioral:       Please refer to HPI    Pulse 82, height 5\' 7"  (1.702 m), weight 180 lb (81.6 kg).Body mass index is 28.19 kg/m.  General Appearance: Casual and Neat  Eye Contact:  Good  Speech:  Clear and Coherent and Normal Rate  Volume:  Normal  Mood:  Anxious and Depressed  Affect:  Constricted, Depressed and Anxious  Thought Process:  Coherent, Linear and Descriptions of Associations: Intact  Orientation:  Full (Time, Place, and Person)  Thought Content: Logical, Hallucinations: None and Rumination   Suicidal Thoughts:  No  Homicidal Thoughts:  No  Memory:  WNL  Judgement:  Good  Insight:  Good  Psychomotor Activity:  Normal  Concentration:  Concentration: Good and Attention Span: Good  Recall:  Good  Fund of Knowledge: Good  Language: Good  Assets:  Communication Skills Desire for Improvement Resilience Social Support  ADL's:  Intact  Cognition: WNL  Prognosis:  Good   Screenings:  GAD-7     Office Visit from 12/01/2019 in [facility] [place]  Total GAD-7 Score  10    PHQ2-9     Office Visit from 12/01/2019 in [facility]  PHQ-2 Total Score  2  PHQ-9 Total Score  12      Receiving Psychotherapy: No   Treatment Plan/Recommendations: Pt seen for 60 minutes and time spent counseling pt re: mood and anxiety s/s and possible tx options. Reviewed different types of antidepressants and their proposed mechanisms of action. Reviewed potential benefits, risks, and side effects of Paxil and discussed that increasing dose to 30  mg po qd for anxiety and depression since pt reports some partial improvement in mood and anxiety with Paxil 20 mg po qd. Pt agrees to increase in Paxil. Recommend continuing Ativan prn anxiety. Discussed potential benefits of therapy and pt agrees to starting therapy. Referral made to [facility]. Pt to f/u with this provider in 4 weeks or sooner if clinically indicated. Patient advised to contact office with any questions, adverse effects, or acute worsening in signs and symptoms.   [facility], PMHNP

## 2020-02-21 ENCOUNTER — Ambulatory Visit: Admitting: Psychiatry

## 2020-02-28 ENCOUNTER — Ambulatory Visit: Admitting: Family Medicine

## 2020-03-10 ENCOUNTER — Other Ambulatory Visit: Payer: Self-pay

## 2020-03-10 ENCOUNTER — Encounter: Payer: Self-pay | Admitting: Internal Medicine

## 2020-03-10 DIAGNOSIS — F419 Anxiety disorder, unspecified: Secondary | ICD-10-CM

## 2020-03-10 DIAGNOSIS — G4700 Insomnia, unspecified: Secondary | ICD-10-CM

## 2020-03-10 DIAGNOSIS — F330 Major depressive disorder, recurrent, mild: Secondary | ICD-10-CM

## 2020-03-10 MED ORDER — TRAZODONE HCL 50 MG PO TABS: ORAL_TABLET | ORAL | 1 refills | Status: DC

## 2020-03-10 MED ORDER — PAROXETINE HCL 30 MG PO TABS: 30.0000 mg | ORAL_TABLET | Freq: Every day | ORAL | 1 refills | Status: DC

## 2020-03-12 MED ORDER — NORGESTIM-ETH ESTRAD TRIPHASIC 0.18/0.215/0.25 MG-35 MCG PO TABS: 1.0000 | ORAL_TABLET | Freq: Every day | ORAL | 11 refills | Status: DC

## 2020-04-10 ENCOUNTER — Ambulatory Visit: Admitting: Family Medicine

## 2020-04-10 NOTE — Progress Notes (Deleted)
[name] [address] [place] [address] Phone: [phone] Subjective:    I'm seeing this patient by the request  of:  Pincus Sanes, MD  CC:   [id number]  Margaret Deleon is a 39 y.o. female coming in with complaint of back and neck pain Patient states   Medications patient has been prescribed:   Taking:         Reviewed prior external information including notes and imaging from previsou exam, outside providers and external EMR if available.   As well as notes that were available from care everywhere and other healthcare systems.  Past medical history, social, surgical and family history all reviewed in electronic medical record.  No pertanent information unless stated regarding to the chief complaint.   Past Medical History:  Diagnosis Date  . Anxiety 09/17/2017  . Depression   . Elevated blood pressure reading 10/14/2017   Elevated blood pressure  . Hypertension   . Migraine headache 09/17/2017   Has been on propranolol and labetalol in the past Did not tolerate Topamax Takes Excedrin Migraine as needed, has not tried has tried Fioricet once  . Migraines   . Muscle tightness 09/17/2017  . Neck pain 09/17/2017  . Nonallopathic lesion of cervical region 10/06/2017  . Nonallopathic lesion of lumbosacral region 10/06/2017  . Nonallopathic lesion of thoracic region 10/06/2017  . Palpitations 09/17/2017   Palpitations    Allergies  Allergen Reactions  . Codeine Nausea And Vomiting  . Prozac [Fluoxetine Hcl]     Increased anxiety  . Topamax [Topiramate]     Felt weird, high like and tired     Review of Systems:  No headache, visual changes, nausea, vomiting, diarrhea, constipation, dizziness, abdominal pain, skin rash, fevers, chills, night sweats, weight loss, swollen lymph nodes, body aches, joint swelling, chest pain, shortness of breath, mood changes. POSITIVE muscle aches  Objective  There were no vitals taken for this  visit.   General: No apparent distress alert and oriented x3 mood and affect normal, dressed appropriately.  HEENT: Pupils equal, extraocular movements intact  Respiratory: Patient's speak in full sentences and does not appear short of breath  Cardiovascular: No lower extremity edema, non tender, no erythema  Neuro: Cranial nerves II through XII are intact, neurovascularly intact in all extremities with 2+ DTRs and 2+ pulses.  Gait normal with good balance and coordination.  MSK:  Non tender with full range of motion and good stability and symmetric strength and tone of shoulders, elbows, wrist, hip, knee and ankles bilaterally.  Back - Normal skin, Spine with normal alignment and no deformity.  No tenderness to vertebral process palpation.  Paraspinous muscles are not tender and without spasm.   Range of motion is full at neck and lumbar sacral regions  Osteopathic findings  C2 flexed rotated and side bent right C6 flexed rotated and side bent left T3 extended rotated and side bent right inhaled rib T9 extended rotated and side bent left L2 flexed rotated and side bent right Sacrum right on right       Assessment and Plan:    Nonallopathic problems  Decision today to treat with OMT was based on Physical Exam  After verbal consent patient was treated with HVLA, ME, FPR techniques in cervical, rib, thoracic, lumbar, and sacral  areas  Patient tolerated the procedure well with improvement in symptoms  Patient given exercises, stretches and lifestyle modifications  See medications in patient instructions if given  Patient will follow up in 4-8 weeks      The above documentation has been reviewed and is accurate and complete Judi Saa, DO       Note: This dictation was prepared with Dragon dictation along with smaller phrase technology. Any transcriptional errors that result from this process are unintentional.

## 2020-04-17 ENCOUNTER — Ambulatory Visit (INDEPENDENT_AMBULATORY_CARE_PROVIDER_SITE_OTHER): Admitting: Psychiatry

## 2020-04-17 ENCOUNTER — Encounter: Payer: Self-pay | Admitting: Internal Medicine

## 2020-04-17 ENCOUNTER — Other Ambulatory Visit: Payer: Self-pay

## 2020-04-17 ENCOUNTER — Encounter: Payer: Self-pay | Admitting: Psychiatry

## 2020-04-17 DIAGNOSIS — F411 Generalized anxiety disorder: Secondary | ICD-10-CM | POA: Diagnosis not present

## 2020-04-17 DIAGNOSIS — F330 Major depressive disorder, recurrent, mild: Secondary | ICD-10-CM

## 2020-04-17 DIAGNOSIS — F419 Anxiety disorder, unspecified: Secondary | ICD-10-CM

## 2020-04-17 DIAGNOSIS — G4700 Insomnia, unspecified: Secondary | ICD-10-CM

## 2020-04-17 MED ORDER — PAROXETINE HCL 40 MG PO TABS: 40.0000 mg | ORAL_TABLET | Freq: Every day | ORAL | 1 refills | Status: DC

## 2020-04-17 MED ORDER — TRAZODONE HCL 100 MG PO TABS: ORAL_TABLET | ORAL | 0 refills | Status: DC
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
## 2020-04-17 NOTE — Progress Notes (Signed)
Margaret Deleon [id number] [birth year] 39 y.o.  Subjective:   Patient ID:  Margaret Deleon is a 39 y.o. (DOB [birth year]) female.  Chief Complaint:  Chief Complaint  Patient presents with  . Depression  . Anxiety    HPI Margaret Deleon presents to the office today for follow-up of anxiety, depression, and insomnia. Margaret Deleon reports that it has been a difficult few months due to Margaret Deleon and her husband separating. Husband moved out. Margaret Deleon reports that Margaret Deleon has cried more in the last 1.5 months in response to stress. Reports periods of uncontrolled crying. Margaret Deleon reports that Paxil has helped some with her anxiety. Margaret Deleon reports that Margaret Deleon is sad with mourning for the loss of what Margaret Deleon wanted for her family.  Reports that her BP has been elevated and is following up with PCP. Notices muscle tension as well.   Margaret Deleon reports that Trazodone has been helpful for her sleep. Margaret Deleon reports that Margaret Deleon has lost weight. Appetite has been stable. Margaret Deleon reports that her energy and motivation have been good. Margaret Deleon is playing soccer one night a week and staying active. Margaret Deleon reports that her concentration has been slightly decreased. Denies SI.   Reports that they are planning to have their children start counseling. Children are with her most of the time.   Margaret Deleon reports that Margaret Deleon has been very happy with job change and reports that staff and coworkers have been supportive.   Past Psychiatric Medication Trials: Sertraline- felt tired. Gained weight. Prozac-increased anxiety Buspar- worsening depression Paxil Cymbalta Lexapro Effexor XR Wellbutrin- Took about 15 years ago.  Topamax-adverse reaction Gabapentin- Has helped some with pain. May have helped with sleep. Weird dreams Hydroxyzine- helped some with anxiety and not sleep Propranolol- Took for migraines. Reports that Margaret Deleon had arrhythmias when Margaret Deleon would run. Fatigue.  Ativan- Has not taken recently. Takes only as needed. Was helpful for severe anxiety and insomnia.   GAD-7      Office Visit from 12/01/2019 in [facility]  Total GAD-7 Score 10    PHQ2-9     Office Visit from 12/01/2019 in [facility] Total Score 2  PHQ-9 Total Score 12       Review of Systems:  Review of Systems  Gastrointestinal: Negative.   Musculoskeletal: Negative for gait problem.  Neurological: Positive for dizziness and headaches. Negative for tremors.  Psychiatric/Behavioral:       Please refer to HPI    Medications: I have reviewed the patient's current medications.  Current Outpatient Medications  Medication Sig Dispense Refill  . amLODipine (NORVASC) 2.5 MG tablet Take 2.5 mg by mouth daily.    . cyclobenzaprine (FLEXERIL) 10 MG tablet Take 1/2 to 1 tablet (5 to 10 mg) by mouth at bedtime. 30 tablet 3  . gabapentin (NEURONTIN) 100 MG capsule Take 2 capsules (200 mg total) by mouth at bedtime. (Patient taking differently: Take 200 mg by mouth at bedtime as needed. ) 180 capsule 1  . Norgestimate-Ethinyl Estradiol Triphasic (TRI-SPRINTEC) 0.18/0.215/0.25 MG-35 MCG tablet Take 1 tablet by mouth daily. 28 tablet 11  . PARoxetine (PAXIL) 40 MG tablet Take 1 tablet (40 mg total) by mouth daily. 30 tablet 1  . traZODone (DESYREL) 100 MG tablet Take 1/2-1 tabs po QHS prn insomnia 90 tablet 0  . verapamil (CALAN-SR) 120 MG CR tablet TAKE 1 TABLET BY MOUTH AT BEDTIME 90 tablet 1  . hydrocortisone 2.5 % cream APPLY EXTERNALLY TO THE AFFECTED AREA TWICE DAILY AS NEEDED    .  hydrOXYzine (ATARAX/VISTARIL) 10 MG tablet Take 1 tablet (10 mg total) by mouth 3 (three) times daily as needed. (Patient not taking: Reported on 01/13/2020) 30 tablet 0  . levothyroxine (SYNTHROID, LEVOTHROID) 25 MCG tablet Take 1 tablet (25 mcg total) by mouth daily. (Patient not taking: Reported on 01/13/2020) 30 tablet 1  . LORazepam (ATIVAN) 0.5 MG tablet TAKE 1 TABLET(0.5 MG) BY MOUTH DAILY AS NEEDED FOR ANXIETY (Patient not taking: Reported on 01/13/2020) 10  tablet 0  . Multiple Vitamin (MULTIVITAMIN) tablet Take 1 tablet by mouth daily.    . Nitroglycerin 0.4 % OINT Apply twice daily to affect area 30 g 0  . prochlorperazine (COMPAZINE) 10 MG tablet Take 1 tablet (10 mg total) by mouth every 8 (eight) hours as needed for refractory nausea / vomiting (refractory headache). 15 tablet 0  . triamcinolone ointment (KENALOG) 0.5 % Apply 1 application topically 2 (two) times daily. 30 g 0   No current facility-administered medications for this visit.    Medication Side Effects: Other: Feels as if it is easier for her to cry.   Allergies:  Allergies  Allergen Reactions  . Codeine Nausea And Vomiting  . Prozac [Fluoxetine Hcl]     Increased anxiety  . Topamax [Topiramate]     Felt weird, high like and tired    Past Medical History:  Diagnosis Date  . Anxiety 09/17/2017  . Depression   . Elevated blood pressure reading 10/14/2017   Elevated blood pressure  . Hypertension   . Migraine headache 09/17/2017   Has been on propranolol and labetalol in the past Did not tolerate Topamax Takes Excedrin Migraine as needed, has not tried has tried Fioricet once  . Migraines   . Muscle tightness 09/17/2017  . Neck pain 09/17/2017  . Nonallopathic lesion of cervical region 10/06/2017  . Nonallopathic lesion of lumbosacral region 10/06/2017  . Nonallopathic lesion of thoracic region 10/06/2017  . Palpitations 09/17/2017   Palpitations    Family History  Problem Relation Age of Onset  . Depression Mother   . Cancer Father   . Alcohol abuse Brother   . Depression Brother   . Migraines Sister   . Anxiety disorder Sister     Social History   Socioeconomic History  . Marital status: Married    Spouse name: Not on file  . Number of children: 3  . Years of education: Not on file  . Highest education level: Not on file  Occupational History  . Not on file  Tobacco Use  . Smoking status: Never Smoker  . Smokeless tobacco: Never Used  Vaping Use  .  Vaping Use: Never used  Substance and Sexual Activity  . Alcohol use: Yes    Comment: occasionally   . Drug use: No  . Sexual activity: Yes  Other Topics Concern  . Not on file  Social History Narrative  . Not on file   Social Determinants of Health   Financial Resource Strain:   . Difficulty of Paying Living Expenses:   Food Insecurity:   . Worried About Programme researcher, broadcasting/film/video in the Last Year:   . Barista in the Last Year:   Transportation Needs:   . Freight forwarder (Medical):   [name] Kitchen Lack of Transportation (Non-Medical):   Physical Activity:   . Days of Exercise per Week:   . Minutes of Exercise per Session:   Stress:   . Feeling of Stress :   Social Connections:   .  Frequency of Communication with Friends and Family:   . Frequency of Social Gatherings with Friends and Family:   . Attends Religious Services:   . Active Member of Clubs or Organizations:   . Attends Banker Meetings:   [name] Kitchen Marital Status:   Intimate Partner Violence:   . Fear of Current or Ex-Partner:   . Emotionally Abused:   [name] Kitchen Physically Abused:   . Sexually Abused:     Past Medical History, Surgical history, Social history, and Family history were reviewed and updated as appropriate.   Please see review of systems for further details on the patient's review from today.   Objective:   Physical Exam:  BP (!) 160/105   Pulse 85   Physical Exam Constitutional:      General: Margaret Deleon is not in acute distress. Musculoskeletal:        General: No deformity.  Neurological:     Mental Status: Margaret Deleon is alert and oriented to person, place, and time.     Coordination: Coordination normal.  Psychiatric:        Attention and Perception: Attention and perception normal. Margaret Deleon does not perceive auditory or visual hallucinations.        Mood and Affect: Mood is anxious and depressed. Affect is not labile, blunt, angry or inappropriate.        Speech: Speech normal.        Behavior: Behavior  normal.        Thought Content: Thought content normal. Thought content is not paranoid or delusional. Thought content does not include homicidal or suicidal ideation. Thought content does not include homicidal or suicidal plan.        Cognition and Memory: Cognition and memory normal.        Judgment: Judgment normal.     Comments: Insight intact     Lab Review:     Component Value Date/Time   NA 137 12/01/2019 1643   K 4.5 12/01/2019 1643   CL 103 12/01/2019 1643   CO2 29 12/01/2019 1643   GLUCOSE 85 12/01/2019 1643   BUN 10 12/01/2019 1643   CREATININE 1.03 12/01/2019 1643   CALCIUM 9.3 12/01/2019 1643   PROT 7.3 12/01/2019 1643   ALBUMIN 4.5 12/01/2019 1643   AST 20 12/01/2019 1643   ALT 16 12/01/2019 1643   ALKPHOS 47 12/01/2019 1643   BILITOT 0.4 12/01/2019 1643       Component Value Date/Time   WBC 6.6 12/01/2019 1643   RBC 3.82 (L) 12/01/2019 1643   HGB 12.8 12/01/2019 1643   HCT 37.4 12/01/2019 1643   PLT 253.0 12/01/2019 1643   MCV 97.8 12/01/2019 1643   MCHC 34.2 12/01/2019 1643   RDW 12.4 12/01/2019 1643   LYMPHSABS 1.5 12/01/2019 1643   MONOABS 0.5 12/01/2019 1643   EOSABS 0.1 12/01/2019 1643   BASOSABS 0.0 12/01/2019 1643    No results found for: POCLITH, LITHIUM   No results found for: PHENYTOIN, PHENOBARB, VALPROATE, CBMZ   .res Assessment: Plan:   Discussed treatment options with patient and discussed potential benefits, risks, and side effects of increasing Paxil to 40 mg daily to further improve mood and anxiety signs and symptoms since patient describes partial response with lower doses.  Patient agrees to increase in Paxil. Will continue trazodone 100 mg at bedtime for insomnia. Agree with plan to start therapy today with Sherron Monday, [facility]. Patient to follow-up with this provider in 4 weeks or sooner if clinically indicated. Patient advised  to contact office with any questions, adverse effects, or acute worsening in signs and  symptoms.  Margaret Deleon was seen today for depression and anxiety.  Diagnoses and all orders for this visit:  Anxiety -     PARoxetine (PAXIL) 40 MG tablet; Take 1 tablet (40 mg total) by mouth daily.  Mild episode of recurrent major depressive disorder (HCC) -     PARoxetine (PAXIL) 40 MG tablet; Take 1 tablet (40 mg total) by mouth daily.  Insomnia, unspecified type -     traZODone (DESYREL) 100 MG tablet; Take 1/2-1 tabs po QHS prn insomnia     Please see After Visit Summary for patient specific instructions.  Future Appointments  Date Time Provider Department Center  04/17/2020  4:00 PM May, [place] None  06/06/2020  3:45 PM Burns, Bobette MoStacy J, MD [facility] None    No orders of the defined types were placed in this encounter.   -------------------------------

## 2020-04-20 DIAGNOSIS — I10 Essential (primary) hypertension: Secondary | ICD-10-CM | POA: Insufficient documentation

## 2020-04-20 NOTE — Progress Notes (Signed)
Subjective:    Patient ID: Margaret Deleon, female    DOB: February 24, 1981, 39 y.o.   MRN: [id number]  HPI The patient is here for follow up of their chronic medical problems, including hypertension   She was started on low dose amlodipine by her gyn for elevated BP.  initially she was on 2.5 mg and now 5 mg.  She was having headaches and felt loopy which has stopped since being on the medication.  Now, she feels good.  BP has been controlled.    Medications and allergies reviewed with patient and updated if appropriate.  Patient Active Problem List   Diagnosis Date Noted  . Hypertension 04/20/2020  . Anal fissure 12/01/2019  . Rash and nonspecific skin eruption 04/14/2019  . Lumbar radiculopathy, right 08/25/2018  . Vitamin D deficiency 06/10/2018  . Fatigue 06/10/2018  . Nonallopathic lesion of rib cage 04/01/2018  . Nonallopathic lesion of sacral region 04/01/2018  . Nonallopathic lesion of cervical region 10/06/2017  . Nonallopathic lesion of thoracic region 10/06/2017  . Nonallopathic lesion of lumbosacral region 10/06/2017  . Migraine headache 09/17/2017  . Anxiety 09/17/2017  . Neck pain 09/17/2017  . Muscle tightness 09/17/2017  . Palpitation 09/17/2017    Current Outpatient Medications on File Prior to Visit  Medication Sig Dispense Refill  . cyclobenzaprine (FLEXERIL) 10 MG tablet Take 1/2 to 1 tablet (5 to 10 mg) by mouth at bedtime. 30 tablet 3  . gabapentin (NEURONTIN) 100 MG capsule Take 2 capsules (200 mg total) by mouth at bedtime. (Patient taking differently: Take 200 mg by mouth at bedtime as needed. ) 180 capsule 1  . hydrocortisone 2.5 % cream APPLY EXTERNALLY TO THE AFFECTED AREA TWICE DAILY AS NEEDED    . Multiple Vitamin (MULTIVITAMIN) tablet Take 1 tablet by mouth daily.    [name] Kitchen PARoxetine (PAXIL) 40 MG tablet Take 1 tablet (40 mg total) by mouth daily. 30 tablet 1  . prochlorperazine (COMPAZINE) 10 MG tablet Take 1 tablet (10 mg total) by mouth every 8  (eight) hours as needed for refractory nausea / vomiting (refractory headache). 15 tablet 0  . traZODone (DESYREL) 100 MG tablet Take 1/2-1 tabs po QHS prn insomnia 90 tablet 0  . triamcinolone ointment (KENALOG) 0.5 % Apply 1 application topically 2 (two) times daily. 30 g 0  . verapamil (CALAN-SR) 120 MG CR tablet TAKE 1 TABLET BY MOUTH AT BEDTIME 90 tablet 1   No current facility-administered medications on file prior to visit.    Past Medical History:  Diagnosis Date  . Anxiety 09/17/2017  . Depression   . Elevated blood pressure reading 10/14/2017   Elevated blood pressure  . Hypertension   . Migraine headache 09/17/2017   Has been on propranolol and labetalol in the past Did not tolerate Topamax Takes Excedrin Migraine as needed, has not tried has tried Fioricet once  . Migraines   . Muscle tightness 09/17/2017  . Neck pain 09/17/2017  . Nonallopathic lesion of cervical region 10/06/2017  . Nonallopathic lesion of lumbosacral region 10/06/2017  . Nonallopathic lesion of thoracic region 10/06/2017  . Palpitations 09/17/2017   Palpitations    Past Surgical History:  Procedure Laterality Date  . BREAST ENHANCEMENT SURGERY    . CESAREAN SECTION      Social History   Socioeconomic History  . Marital status: Married    Spouse name: Not on file  . Number of children: 3  . Years of education: Not on file  . Highest  education level: Not on file  Occupational History  . Not on file  Tobacco Use  . Smoking status: Never Smoker  . Smokeless tobacco: Never Used  Vaping Use  . Vaping Use: Never used  Substance and Sexual Activity  . Alcohol use: Yes    Comment: occasionally   . Drug use: No  . Sexual activity: Yes  Other Topics Concern  . Not on file  Social History Narrative  . Not on file   Social Determinants of Health   Financial Resource Strain:   . Difficulty of Paying Living Expenses:   Food Insecurity:   . Worried About Programme researcher, broadcasting/film/video in the Last Year:   . Occupational psychologist in the Last Year:   Transportation Needs:   . Freight forwarder (Medical):   [name] Kitchen Lack of Transportation (Non-Medical):   Physical Activity:   . Days of Exercise per Week:   . Minutes of Exercise per Session:   Stress:   . Feeling of Stress :   Social Connections:   . Frequency of Communication with Friends and Family:   . Frequency of Social Gatherings with Friends and Family:   . Attends Religious Services:   . Active Member of Clubs or Organizations:   . Attends Banker Meetings:   [name] Kitchen Marital Status:     Family History  Problem Relation Age of Onset  . Depression Mother   . Cancer Father   . Alcohol abuse Brother   . Depression Brother   . Migraines Sister   . Anxiety disorder Sister     Review of Systems  Respiratory: Negative for shortness of breath.   Cardiovascular: Negative for chest pain and palpitations.  Neurological: Negative for light-headedness and headaches.       Objective:   Vitals:   04/21/20 1122  BP: 124/90  Pulse: 78  Temp: 98.2 F (36.8 C)  SpO2: 97%   BP Readings from Last 3 Encounters:  04/21/20 124/90  01/05/20 130/90  12/07/19 128/88   Wt Readings from Last 3 Encounters:  04/21/20 174 lb 4 oz (79 kg)  01/05/20 183 lb (83 kg)  12/07/19 182 lb (82.6 kg)   Body mass index is 27.29 kg/m.   Physical Exam    Constitutional: Appears well-developed and well-nourished. No distress.  HENT:  Head: Normocephalic and atraumatic.  Neck: Neck supple. No tracheal deviation present. No thyromegaly present.  No cervical lymphadenopathy Cardiovascular: Normal rate, regular rhythm and normal heart sounds.   No murmur heard. No carotid bruit .  No edema Pulmonary/Chest: Effort normal and breath sounds normal. No respiratory distress. No has no wheezes. No rales.  Skin: Skin is warm and dry. Not diaphoretic.  Psychiatric: Normal mood and affect. Behavior is normal.      Assessment & Plan:    See Problem List for  Assessment and Plan of chronic medical problems.    This visit occurred during the SARS-CoV-2 public health emergency.  Safety protocols were in place, including screening questions prior to the visit, additional usage of staff PPE, and extensive cleaning of exam room while observing appropriate contact time as indicated for disinfecting solutions.

## 2020-04-21 ENCOUNTER — Ambulatory Visit: Admitting: Internal Medicine

## 2020-04-21 ENCOUNTER — Other Ambulatory Visit: Payer: Self-pay

## 2020-04-21 ENCOUNTER — Ambulatory Visit (INDEPENDENT_AMBULATORY_CARE_PROVIDER_SITE_OTHER): Admitting: Internal Medicine

## 2020-04-21 ENCOUNTER — Encounter: Payer: Self-pay | Admitting: Internal Medicine

## 2020-04-21 DIAGNOSIS — I10 Essential (primary) hypertension: Secondary | ICD-10-CM

## 2020-04-21 MED ORDER — AMLODIPINE BESYLATE 5 MG PO TABS: 5.0000 mg | ORAL_TABLET | Freq: Every day | ORAL | 3 refills | Status: DC

## 2020-04-21 NOTE — Patient Instructions (Signed)
Continue your current medications.

## 2020-04-21 NOTE — Assessment & Plan Note (Signed)
Chronic BP well controlled Current regimen effective and well tolerated Continue current medications at current doses  

## 2020-05-08 ENCOUNTER — Ambulatory Visit: Admitting: Psychiatry

## 2020-06-05 NOTE — Progress Notes (Signed)
Subjective:    Patient ID: Margaret Deleon, female    DOB: 02-Aug-1981, 39 y.o.   MRN: [id number]  HPI The patient is here for follow up of their chronic medical problems, including htn, migraines   She is following with psychiatry.    Medications and allergies reviewed with patient and updated if appropriate.  Patient Active Problem List   Diagnosis Date Noted  . Hypertension 04/20/2020  . Anal fissure 12/01/2019  . Rash and nonspecific skin eruption 04/14/2019  . Lumbar radiculopathy, right 08/25/2018  . Vitamin D deficiency 06/10/2018  . Fatigue 06/10/2018  . Nonallopathic lesion of rib cage 04/01/2018  . Nonallopathic lesion of sacral region 04/01/2018  . Nonallopathic lesion of cervical region 10/06/2017  . Nonallopathic lesion of thoracic region 10/06/2017  . Nonallopathic lesion of lumbosacral region 10/06/2017  . Migraine headache 09/17/2017  . Anxiety 09/17/2017  . Neck pain 09/17/2017  . Muscle tightness 09/17/2017  . Palpitation 09/17/2017    Current Outpatient Medications on File Prior to Visit  Medication Sig Dispense Refill  . amLODipine (NORVASC) 5 MG tablet Take 1 tablet (5 mg total) by mouth daily. 90 tablet 3  . cyclobenzaprine (FLEXERIL) 10 MG tablet Take 1/2 to 1 tablet (5 to 10 mg) by mouth at bedtime. 30 tablet 3  . gabapentin (NEURONTIN) 100 MG capsule Take 2 capsules (200 mg total) by mouth at bedtime. (Patient taking differently: Take 200 mg by mouth at bedtime as needed. ) 180 capsule 1  . hydrocortisone 2.5 % cream APPLY EXTERNALLY TO THE AFFECTED AREA TWICE DAILY AS NEEDED    . Multiple Vitamin (MULTIVITAMIN) tablet Take 1 tablet by mouth daily.    [name] Kitchen PARoxetine (PAXIL) 40 MG tablet Take 1 tablet (40 mg total) by mouth daily. 30 tablet 1  . prochlorperazine (COMPAZINE) 10 MG tablet Take 1 tablet (10 mg total) by mouth every 8 (eight) hours as needed for refractory nausea / vomiting (refractory headache). 15 tablet 0  . traZODone (DESYREL) 100 MG  tablet Take 1/2-1 tabs po QHS prn insomnia 90 tablet 0  . triamcinolone ointment (KENALOG) 0.5 % Apply 1 application topically 2 (two) times daily. 30 g 0  . verapamil (CALAN-SR) 120 MG CR tablet TAKE 1 TABLET BY MOUTH AT BEDTIME 90 tablet 1   No current facility-administered medications on file prior to visit.    Past Medical History:  Diagnosis Date  . Anxiety 09/17/2017  . Depression   . Elevated blood pressure reading 10/14/2017   Elevated blood pressure  . Hypertension   . Migraine headache 09/17/2017   Has been on propranolol and labetalol in the past Did not tolerate Topamax Takes Excedrin Migraine as needed, has not tried has tried Fioricet once  . Migraines   . Muscle tightness 09/17/2017  . Neck pain 09/17/2017  . Nonallopathic lesion of cervical region 10/06/2017  . Nonallopathic lesion of lumbosacral region 10/06/2017  . Nonallopathic lesion of thoracic region 10/06/2017  . Palpitations 09/17/2017   Palpitations    Past Surgical History:  Procedure Laterality Date  . BREAST ENHANCEMENT SURGERY    . CESAREAN SECTION      Social History   Socioeconomic History  . Marital status: Married    Spouse name: Not on file  . Number of children: 3  . Years of education: Not on file  . Highest education level: Not on file  Occupational History  . Not on file  Tobacco Use  . Smoking status: Never Smoker  . Smokeless  tobacco: Never Used  Vaping Use  . Vaping Use: Never used  Substance and Sexual Activity  . Alcohol use: Yes    Comment: occasionally   . Drug use: No  . Sexual activity: Yes  Other Topics Concern  . Not on file  Social History Narrative  . Not on file   Social Determinants of Health   Financial Resource Strain:   . Difficulty of Paying Living Expenses: Not on file  Food Insecurity:   . Worried About Programme researcher, broadcasting/film/video in the Last Year: Not on file  . Ran Out of Food in the Last Year: Not on file  Transportation Needs:   . Lack of Transportation  (Medical): Not on file  . Lack of Transportation (Non-Medical): Not on file  Physical Activity:   . Days of Exercise per Week: Not on file  . Minutes of Exercise per Session: Not on file  Stress:   . Feeling of Stress : Not on file  Social Connections:   . Frequency of Communication with Friends and Family: Not on file  . Frequency of Social Gatherings with Friends and Family: Not on file  . Attends Religious Services: Not on file  . Active Member of Clubs or Organizations: Not on file  . Attends Banker Meetings: Not on file  . Marital Status: Not on file    Family History  Problem Relation Age of Onset  . Depression Mother   . Cancer Father   . Alcohol abuse Brother   . Depression Brother   . Migraines Sister   . Anxiety disorder Sister     Review of Systems     Objective:  There were no vitals filed for this visit. BP Readings from Last 3 Encounters:  04/21/20 124/90  01/05/20 130/90  12/07/19 128/88   Wt Readings from Last 3 Encounters:  04/21/20 174 lb 4 oz (79 kg)  01/05/20 183 lb (83 kg)  12/07/19 182 lb (82.6 kg)   There is no height or weight on file to calculate BMI.   Physical Exam    Constitutional: Appears well-developed and well-nourished. No distress.  HENT:  Head: Normocephalic and atraumatic.  Neck: Neck supple. No tracheal deviation present. No thyromegaly present.  No cervical lymphadenopathy Cardiovascular: Normal rate, regular rhythm and normal heart sounds.   No murmur heard. No carotid bruit .  No edema Pulmonary/Chest: Effort normal and breath sounds normal. No respiratory distress. No has no wheezes. No rales.  Skin: Skin is warm and dry. Not diaphoretic.  Psychiatric: Normal mood and affect. Behavior is normal.      Assessment & Plan:    See Problem List for Assessment and Plan of chronic medical problems.    This visit occurred during the SARS-CoV-2 public health emergency.  Safety protocols were in place,  including screening questions prior to the visit, additional usage of staff PPE, and extensive cleaning of exam room while observing appropriate contact time as indicated for disinfecting solutions.    This encounter was created in error - please disregard.

## 2020-06-05 NOTE — Patient Instructions (Signed)
  Blood work was ordered.     Medications reviewed and updated.  Changes include :     Your prescription(s) have been submitted to your pharmacy. Please take as directed and contact our office if you believe you are having problem(s) with the medication(s).  A referral was ordered for        Someone from their office will call you to schedule an appointment.    Please followup in

## 2020-06-06 ENCOUNTER — Encounter: Admitting: Internal Medicine

## 2020-06-09 ENCOUNTER — Other Ambulatory Visit: Payer: Self-pay | Admitting: Internal Medicine

## 2020-06-12 ENCOUNTER — Ambulatory Visit: Admitting: Psychiatry

## 2020-06-26 ENCOUNTER — Ambulatory Visit (INDEPENDENT_AMBULATORY_CARE_PROVIDER_SITE_OTHER): Admitting: Psychiatry

## 2020-06-26 ENCOUNTER — Encounter: Payer: Self-pay | Admitting: Psychiatry

## 2020-06-26 ENCOUNTER — Other Ambulatory Visit: Payer: Self-pay

## 2020-06-26 DIAGNOSIS — F411 Generalized anxiety disorder: Secondary | ICD-10-CM | POA: Diagnosis not present
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
## 2020-07-10 ENCOUNTER — Ambulatory Visit: Admitting: Psychiatry

## 2020-07-11 ENCOUNTER — Other Ambulatory Visit: Payer: Self-pay

## 2020-07-11 DIAGNOSIS — F419 Anxiety disorder, unspecified: Secondary | ICD-10-CM

## 2020-07-11 DIAGNOSIS — F330 Major depressive disorder, recurrent, mild: Secondary | ICD-10-CM

## 2020-07-11 MED ORDER — PAROXETINE HCL 40 MG PO TABS: 40.0000 mg | ORAL_TABLET | Freq: Every day | ORAL | 0 refills | Status: DC

## 2020-08-31 ENCOUNTER — Ambulatory Visit (INDEPENDENT_AMBULATORY_CARE_PROVIDER_SITE_OTHER)

## 2020-08-31 ENCOUNTER — Ambulatory Visit (INDEPENDENT_AMBULATORY_CARE_PROVIDER_SITE_OTHER): Admitting: Internal Medicine

## 2020-08-31 ENCOUNTER — Encounter: Payer: Self-pay | Admitting: Internal Medicine

## 2020-08-31 ENCOUNTER — Other Ambulatory Visit: Payer: Self-pay

## 2020-08-31 VITALS — BP 140/96 | HR 96 | Temp 99.4°F | Ht 67.0 in | Wt 176.0 lb

## 2020-08-31 DIAGNOSIS — M25562 Pain in left knee: Secondary | ICD-10-CM | POA: Insufficient documentation

## 2020-08-31 NOTE — Patient Instructions (Signed)
We will do the x-ray today and I will contact Dr. Katrinka Blazing to get you in ASAP.    RICE Therapy for Routine Care of Injuries Many injuries can be cared for with rest, ice, compression, and elevation (RICE therapy). This includes:  Resting the injured part.  Putting ice on the injury.  Putting pressure (compression) on the injury.  Raising the injured part (elevation). Using RICE therapy can help to lessen pain and swelling. Supplies needed:  Ice.  Plastic bag.  Towel.  Elastic bandage.  Pillow or pillows to raise (elevate) your injured body part. How to care for your injury with RICE therapy Rest Limit your normal activities, and try not to use the injured part of your body. You can go back to your normal activities when your doctor says it is okay to do them and you feel okay. Ask your doctor if you should do exercises to help your injury get better. Ice Put ice on the injured area. Do not put ice on your bare skin.  Put ice in a plastic bag.  Place a towel between your skin and the bag.  Leave the ice on for 20 minutes, 2-3 times a day. Use ice on as many days as told by your doctor.  Compression Compression means putting pressure on the injured area. This can be done with an elastic bandage. If an elastic bandage has been put on your injury:  Do not wrap the bandage too tight. Wrap the bandage more loosely if part of your body away from the bandage is blue, swollen, cold, painful, or loses feeling (gets numb).  Take off the bandage and put it on again. Do this every 3-4 hours or as told by your doctor.  See your doctor if the bandage seems to make your problems worse.  Elevation Elevation means keeping the injured area raised. If you can, raise the injured area above your heart or the center of your chest. Contact a doctor if:  You keep having pain and swelling.  Your symptoms get worse. Get help right away if:  You have sudden bad pain at your injury or lower  than your injury.  You have redness or more swelling around your injury.  You have tingling or numbness at your injury or lower than your injury, and it does not go away when you take off the bandage. Summary  Many injuries can be cared for using rest, ice, compression, and elevation (RICE therapy).  You can go back to your normal activities when you feel okay and your doctor says it is okay.  Put ice on the injured area as told by your doctor.  Get help if your symptoms get worse or if you keep having pain and swelling. This information is not intended to replace advice given to you by your health care provider. Make sure you discuss any questions you have with your health care provider. Document Revised: 05/23/2017 Document Reviewed: 05/23/2017 Elsevier Patient Education  2020 [employer].

## 2020-08-31 NOTE — Assessment & Plan Note (Signed)
Checking x-ray to rule out fibular fracture. Will get her in with sports medicine for prognostication as she would like to return to normal activities as soon as possible.

## 2020-08-31 NOTE — Progress Notes (Signed)
   Subjective:   Patient ID: Margaret Deleon, female    DOB: [birth year], 39 y.o.   MRN: [id number]  HPI The patient is a 39 YO female coming in for concerns about left knee pain. 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 She had immediate sharp burning pain 10/10. Did have to leave game and then was able to re-enter and finish some time later. Keep it up and iced last night and took ibuprofen. She is having some swelling bottom part of the knee and in the low leg. There is some numbness in that area. She is trying to keep pressure off leg with walking but can walk without collapse. She is very active and wants to be proactive to recover fastest and avoid lasting injury.   Review of Systems  Constitutional: Positive for activity change. Negative for appetite change, chills, fatigue, fever and unexpected weight change.  Respiratory: Negative.   Cardiovascular: Negative.   Gastrointestinal: Negative.   Musculoskeletal: Positive for arthralgias, joint swelling and myalgias. Negative for back pain and gait problem.  Skin: Negative.   Neurological: Negative.     Objective:  Physical Exam Constitutional:      Appearance: She is well-developed and well-nourished.  HENT:     Head: Normocephalic and atraumatic.  Eyes:     Extraocular Movements: EOM normal.  Cardiovascular:     Rate and Rhythm: Normal rate and regular rhythm.  Pulmonary:     Effort: Pulmonary effort is normal. No respiratory distress.     Breath sounds: Normal breath sounds. No wheezing or rales.  Abdominal:     General: Bowel sounds are normal. There is no distension.     Palpations: Abdomen is soft.     Tenderness: There is no abdominal tenderness. There is no rebound.  Musculoskeletal:        General: No edema.     Cervical back: Normal range of motion.     Comments: Left knee with swelling bottom half of the patella and into the superior tibia/fibula. Tenderness along MCL. LCL without tenderness. ACL  intact. Mild knee effusion in joint. Some bruising.   Skin:    General: Skin is warm and dry.  Neurological:     Mental Status: She is alert and oriented to person, place, and time.     Coordination: Coordination normal.  Psychiatric:        Mood and Affect: Mood and affect normal.     Vitals:   08/31/20 1334  BP: (!) 140/96  Pulse: 96  Temp: 99.4 F (37.4 C)  TempSrc: Oral  SpO2: 99%  Weight: 176 lb (79.8 kg)  Height: 5\' 7"  (1.702 m)    This visit occurred during the SARS-CoV-2 public health emergency.  Safety protocols were in place, including screening questions prior to the visit, additional usage of staff PPE, and extensive cleaning of exam room while observing appropriate contact time as indicated for disinfecting solutions.   Assessment & Plan:

## 2020-09-04 ENCOUNTER — Encounter: Payer: Self-pay | Admitting: Internal Medicine

## 2020-09-06 NOTE — Progress Notes (Signed)
Subjective:   I, Margaret Deleon, LAT, ATC acting as a scribe for Margaret Graham, MD.  I'm seeing this patient as a consultation for Dr. Cheryll Cockayne. Note will be routed back to referring provider/PCP.  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 MOI: When turned to make a save, she felt extreme sharp/buring pain ad fell to the ground and had to leave the game, but did re-enter later. Pt locates pain to medial joint line and onto medial tibia. Pt c/o some numbness and feeling "tight." Now pain is a constant ache. Of note, pt is very active. Pt has a hx of torn PCL tear and strained ACL in L knee approx 8 years ago.  She is having quite a bit of pain and burning on the anterior knee.  She feels some instability.  Knee swelling: yes Mechanical symptoms: yes- when DF  Rx tried: IBU, ice, elevation  Dx imaging: 08/31/20 L knee XR  Past medical history, Surgical history, Family history, Social history, Allergies, and medications have been entered into the medical record, reviewed.   Review of Systems: No new headache, visual changes, nausea, vomiting, diarrhea, constipation, dizziness, abdominal pain, skin rash, fevers, chills, night sweats, weight loss, swollen lymph nodes, body aches, joint swelling, muscle aches, chest pain, shortness of breath, mood changes, visual or auditory hallucinations.   Objective:    Vitals:   09/07/20 1246  BP: 138/88  Pulse: 84  SpO2: 100%   General: Well Developed, well nourished, and in no acute distress.  Neuro/Psych: Alert and oriented x3, extra-ocular muscles intact, able to move all 4 extremities, sensation grossly intact. Skin: Warm and dry, no rashes noted.  Respiratory: Not using accessory muscles, speaking in full sentences, trachea midline.  Cardiovascular: Pulses palpable, no extremity edema. Abdomen: Does not appear distended. MSK: Left knee bruising  along the anterior medial knee. Tender palpation anterior knee. Range of motion 5-100 degrees. Guarding with ligament exam testing some laxity with anterior drawer testing. Negative Murray's test. Intact strength flexion and extension.   Lab and Radiology Results DG Knee Complete 4 Views Left  Result Date: 08/31/2020 CLINICAL DATA:  Left knee pain, left knee injury EXAM: LEFT KNEE - COMPLETE 4+ VIEW COMPARISON:  None. FINDINGS: Four view radiograph left knee demonstrates normal alignment. No fracture or dislocation. Medial and lateral compartment joint spaces are preserved. Trace left knee effusion is present. Soft tissues are otherwise unremarkable. IMPRESSION: Trace effusion.  No fracture or dislocation. Electronically Signed   By: Helyn Numbers MD   On: 08/31/2020 21:20   I, Margaret Deleon, personally (independently) visualized and performed the interpretation of the images attached in this note.  Diagnostic Limited MSK Ultrasound of: Left knee Quad tendon intact normal-appearing Small joint effusion superior patellar space. Patellar tendon is intact. Small amount of hypoechoic fluid tracks within subcutaneous tissue consistent with mild prepatellar bursitis. Medial and lateral joint lines are normal. Small amount of hypoechoic fluid tracks near the pes anserine bursa. No significant Baker's cyst. Impression: Prepatellar bursitis small joint effusion small Pes anserine bursitis.   Impression and Recommendations:    Assessment and Plan: 39 y.o. female with left knee pain after a twisting falling injury occurring while playing soccer a week ago.  Patient is having severe pain a week later and inability to walk normally and having some difficulty at work.  She does have mechanical symptoms and  some laxity on exam testing.  After discussion plan for MRI to further evaluate cause of pain and for potential surgical planning.  In the interim recommend Voltaren gel and increasing dose of  gabapentin at bedtime as this may be helpful.  Check back after MRI.  Discussed knee bracing patient cannot tolerate that amount of pressure on her skin at this time.[name] Kitchen  PDMP not reviewed this encounter. Orders Placed This Encounter  Procedures  . [place] LIMITED JOINT SPACE STRUCTURES LOW LEFT(NO LINKED CHARGES)    Standing Status:   Future    Number of Occurrences:   1    Standing Expiration Date:   03/08/2021    Order Specific Question:   Reason for Exam (SYMPTOM  OR DIAGNOSIS REQUIRED)    Answer:   left knee pain    Order Specific Question:   Preferred imaging location?    Answer:   Adult nurse [facility]  . MR Knee Left  Wo Contrast    Standing Status:   Future    Standing Expiration Date:   09/07/2021    Order Specific Question:   What is the patient's sedation requirement?    Answer:   No Sedation    Order Specific Question:   Does the patient have a pacemaker or implanted devices?    Answer:   No    Order Specific Question:   Preferred imaging location?    Answer:   [facility] (table limit-350lbs)   No orders of the defined types were placed in this encounter.   Discussed warning signs or symptoms. Please see discharge instructions. Patient expresses understanding.   The above documentation has been reviewed and is accurate and complete Margaret Deleon, M.D.

## 2020-09-07 ENCOUNTER — Ambulatory Visit (INDEPENDENT_AMBULATORY_CARE_PROVIDER_SITE_OTHER): Admitting: Family Medicine

## 2020-09-07 ENCOUNTER — Other Ambulatory Visit: Payer: Self-pay

## 2020-09-07 ENCOUNTER — Ambulatory Visit: Payer: Self-pay

## 2020-09-07 VITALS — BP 138/88 | HR 84 | Ht 67.0 in | Wt 177.6 lb

## 2020-09-07 DIAGNOSIS — M25562 Pain in left knee: Secondary | ICD-10-CM

## 2020-09-07 NOTE — Patient Instructions (Signed)
Thank you for coming in today.  Plan for MRI.   In the mean time use the Voltaren gel over the counter.   Ok to continue ibuprofen and tylenol.   Ok to use gabapentin at bedtime up to 300-600 mg   I think you can stop the ice unless you feel better with it.   Recheck after MRI.

## 2020-09-18 ENCOUNTER — Ambulatory Visit: Admitting: Psychiatry

## 2020-09-23 ENCOUNTER — Ambulatory Visit (INDEPENDENT_AMBULATORY_CARE_PROVIDER_SITE_OTHER)

## 2020-09-23 ENCOUNTER — Other Ambulatory Visit: Payer: Self-pay

## 2020-09-23 DIAGNOSIS — M25562 Pain in left knee: Secondary | ICD-10-CM | POA: Diagnosis not present

## 2020-09-23 DIAGNOSIS — Y9366 Activity, soccer: Secondary | ICD-10-CM

## 2020-09-23 DIAGNOSIS — R2242 Localized swelling, mass and lump, left lower limb: Secondary | ICD-10-CM | POA: Diagnosis not present

## 2020-09-25 NOTE — Progress Notes (Signed)
MRI of the left knee shows mild to medial arthritis underneath the kneecap.  No meniscus tear.  Recommend return to clinic to discuss results in full detail.

## 2020-09-27 NOTE — Progress Notes (Unsigned)
I, Christoper Fabian, LAT, ATC, am serving as scribe for Dr. Clementeen Graham.  Margaret Deleon is a 40 y.o. 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  She was last seen by Dr. Denyse Amass on 09/07/20 and reported L medial joint line pain and some instability.  Pt has a hx of a L knee PCL tear and sprained ACL approximately 8 years ago.  Since her last visit, pt reports some improvement, as she was able to work out this week x2, but has not been able to return to running or playing soccer. Pt c/o pain w/ any type of pressure over knee. Pt c/o swelling remains. Pt notes sometimes knee feels like it gets "stuck" and then needs to pop.  Diagnostic testing: L knee MRI- 09/23/20; L knee XR- 08/31/20  Pertinent review of systems: No fevers or chills  Relevant historical information: Migraine headache and history   Exam:  BP 118/84 (BP Location: Right Arm, Patient Position: Sitting, Cuff Size: Normal)   Pulse 80   Ht 5\' 7"  (1.702 m)   Wt 181 lb 9.6 oz (82.4 kg)   SpO2 98%   BMI 28.44 kg/m  General: Well Developed, well nourished, and in no acute distress.   MSK: Left knee mildly swollen anterior medial knee.  Tender palpation anterior medial knee at the proximal tibia.  Otherwise nontender normal motion and strength.    Lab and Radiology Results   EXAM: MRI OF THE LEFT KNEE WITHOUT CONTRAST  TECHNIQUE: Multiplanar, multisequence MR imaging of the knee was performed. No intravenous contrast was administered.  COMPARISON:  Left knee x-rays dated August 31, 2020.  FINDINGS: MENISCI  Medial meniscus:  Intact.  Lateral meniscus:  Intact.  LIGAMENTS  Cruciates:  Intact ACL and PCL.  Collaterals: Medial collateral ligament is intact. Lateral collateral ligament complex is intact.  CARTILAGE  Patellofemoral: Full-thickness  delamination over the medial patellar facet. Superficial fissuring over the patellar apex.  Medial: Mild partial-thickness cartilage loss. Full-thickness fissuring over the anterior medial tibial plateau with trace subchondral marrow edema.  Lateral:  No chondral defect.  Joint: No joint effusion. Minimal edema within the central aspect of Hoffa's fat.  Popliteal Fossa:  No Baker cyst. Intact popliteus tendon.  Extensor Mechanism: Intact quadriceps tendon and patellar tendon. Intact medial and lateral patellar retinaculum. Intact MPFL.  Bones: No acute fracture or dislocation. No suspicious bone lesion.  Other: None.  IMPRESSION: 1. No meniscal or ligamentous injury. 2. Mild medial and patellofemoral compartment osteoarthritis.   Electronically Signed   By: September 02, 2020 M.D.   On: 09/24/2020 09:59  I, 11/22/2020, personally (independently) visualized and performed the interpretation of the images attached in this note.  Procedure: Real-time Ultrasound Guided Injection of left knee area of tenderness near pes anserine bursa Device: Philips Affiniti 50G Images permanently stored and available for review in PACS Verbal informed consent obtained.  Discussed risks and benefits of procedure. Warned about infection bleeding damage to structures skin hypopigmentation and fat atrophy among others. Patient expresses understanding and agreement Time-out conducted.   Noted no overlying erythema, induration, or other signs of local infection.   Skin prepped in a sterile fashion.   Local anesthesia: Topical Ethyl chloride.   With sterile technique and under real time ultrasound guidance:  40 mg of Kenalog and 2 mL of Marcaine injected  into hypoechoic fluid at area of tenderness near pes anserine bursa. Fluid seen entering the bursa.   Completed without difficulty   Pain moderately resolved suggesting accurate placement of the medication.   Advised to call if fevers/chills,  erythema, induration, drainage, or persistent bleeding.   Images permanently stored and available for review in the ultrasound unit.  Impression: Technically successful ultrasound guided injection.        Assessment and Plan: 40 y.o. female with anterior medial knee pain after a falling injury occurring several weeks ago.  Initially patient was concerning for significant ligament injury but MRI showed no meniscus or ligament injury.  I think the main issue is just contusion at this point.  We discussed options.  Plan for trial of physical therapy.  Additionally we will proceed with direct injection in this area.  Recheck back if not improving.   PDMP not reviewed this encounter. Orders Placed This Encounter  Procedures  . [place] LIMITED JOINT SPACE STRUCTURES LOW LEFT(NO LINKED CHARGES)    Standing Status:   Future    Number of Occurrences:   1    Standing Expiration Date:   03/28/2021    Order Specific Question:   Reason for Exam (SYMPTOM  OR DIAGNOSIS REQUIRED)    Answer:   left knee pain    Order Specific Question:   Preferred imaging location?    Answer:   Adult nurse [facility]  . Ambulatory referral to Physical Therapy    Referral Priority:   Routine    Referral Type:   Physical Medicine    Referral Reason:   Specialty Services Required    Requested Specialty:   Physical Therapy   No orders of the defined types were placed in this encounter.    Discussed warning signs or symptoms. Please see discharge instructions. Patient expresses understanding.   The above documentation has been reviewed and is accurate and complete Clementeen Graham, M.D.

## 2020-09-28 ENCOUNTER — Ambulatory Visit (INDEPENDENT_AMBULATORY_CARE_PROVIDER_SITE_OTHER): Admitting: Family Medicine

## 2020-09-28 ENCOUNTER — Ambulatory Visit: Payer: Self-pay

## 2020-09-28 ENCOUNTER — Other Ambulatory Visit: Payer: Self-pay

## 2020-09-28 VITALS — BP 118/84 | HR 80 | Ht 67.0 in | Wt 181.6 lb

## 2020-09-28 DIAGNOSIS — M25562 Pain in left knee: Secondary | ICD-10-CM

## 2020-09-28 NOTE — Patient Instructions (Addendum)
Thank you for coming in today.  Call or go to the ER if you develop a large red swollen joint with extreme pain or oozing puss.   I've referred you to Physical Therapy.  Let us know if you don't hear from them in one week.   Let me know how you do after PT.

## 2020-10-02 ENCOUNTER — Ambulatory Visit: Admitting: Psychiatry

## 2020-10-09 ENCOUNTER — Ambulatory Visit (INDEPENDENT_AMBULATORY_CARE_PROVIDER_SITE_OTHER): Admitting: Physical Therapy

## 2020-10-09 ENCOUNTER — Other Ambulatory Visit: Payer: Self-pay

## 2020-10-09 ENCOUNTER — Encounter: Payer: Self-pay | Admitting: Physical Therapy

## 2020-10-09 DIAGNOSIS — M25562 Pain in left knee: Secondary | ICD-10-CM | POA: Diagnosis not present

## 2020-10-09 NOTE — Therapy (Signed)
[facility] [address] [place], [place], [address] Phone: [phone]   Fax:  [phone]  Physical Therapy Evaluation  Patient Details  Name: Margaret Deleon MRN: [id number] Date of Birth: 08/23/1981 Referring Provider (PT): Clementeen Graham MD   Encounter Date: 10/09/2020   PT End of Session - 10/09/20 0852    Visit Number 1    Number of Visits 12    Date for PT Re-Evaluation 11/20/20    Authorization Type tricare    PT Start Time [phone]    PT Stop Time 0933    PT Time Calculation (min) 41 min    Activity Tolerance Patient tolerated treatment well    Behavior During Therapy [facility] for tasks assessed/performed           Past Medical History:  Diagnosis Date  . Anxiety 09/17/2017  . Depression   . Elevated blood pressure reading 10/14/2017   Elevated blood pressure  . Hypertension   . Migraine headache 09/17/2017   Has been on propranolol and labetalol in the past Did not tolerate Topamax Takes Excedrin Migraine as needed, has not tried has tried Fioricet once  . Migraines   . Muscle tightness 09/17/2017  . Neck pain 09/17/2017  . Nonallopathic lesion of cervical region 10/06/2017  . Nonallopathic lesion of lumbosacral region 10/06/2017  . Nonallopathic lesion of thoracic region 10/06/2017  . Palpitations 09/17/2017   Palpitations    Past Surgical History:  Procedure Laterality Date  . BREAST ENHANCEMENT SURGERY    . CESAREAN SECTION      There were no vitals filed for this visit.    Subjective Assessment - 10/09/20 0855    Subjective 5 weeks ago playing soccer and twisted knee and slammed it into the ground. Swollen painful and numb in medial knee. She has started to work out and returned to soccer and running. At end of day it is worse. Stands doing [place] all day so hurts more at end.    Pertinent History anxiety/depression, migraines    Diagnostic tests mri: no meniscal injury, mild medial and PF compartmental OA     Patient Stated Goals get rid of the pain and get back to normal activites 100%    Currently in Pain? Yes    Pain Score 3    5/10 at end of day.   Pain Location Knee    Pain Orientation Left;Medial    Pain Descriptors / Indicators Aching    Pain Type Acute pain    Pain Onset More than a month ago    Pain Frequency Constant    Aggravating Factors  prolonged standing, running working out    Pain Relieving Factors ice              [facility] PT Assessment - 10/09/20 0001      Assessment   Medical Diagnosis left ant knee pain    Referring Provider (PT) Clementeen Graham MD    Onset Date/Surgical Date 09/04/20    Hand Dominance Right    Next MD Visit not scheduled      Precautions   Precautions None      Restrictions   Weight Bearing Restrictions No      Balance Screen   Has the patient fallen in the past 6 months No    Has the patient had a decrease in activity level because of a fear of falling?  No    Is the patient reluctant to leave their home because of a  fear of falling?  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[place] [employer]    Leisure soccer, running work outs      Observation/Other Assessments   Focus on Therapeutic Outcomes (FOTO)  FSPM = 72 (goal 81)      Observation/Other Assessments-Edema    Edema --   visible edema at left medial joint line     Functional Tests   Functional tests Squat;Lunges;Step up;Step down;Single Leg Squat;Hopping;Single leg stance      Squat   Comments WNL      Lunges   Comments split squat some left hip weakness      Step Up   Comments WNL      Step Down   Comments weak bil with eccentric lowering Left>Right      Single Leg Squat   Comments weakness bil in hips      Hopping   Comments a little tougher on left but good form      Single Leg Stance    Comments WNL bil      Posture/Postural Control   Posture Comments WNL      ROM / Strength   AROM / PROM / Strength AROM;Strength      AROM   Overall AROM Comments left knee 0-134; right 0-138      Strength   Overall Strength Comments left knee flex 4+/5, else 5/5 bil LE      Flexibility   Soft Tissue Assessment /Muscle Length yes    Hamstrings bil Rt >Lt    Quadriceps left tight vs. right    ITB bil WNL    Piriformis right >Left      Palpation   Patella mobility WNL    Palpation comment medial joint line left                      Objective measurements completed on examination: See above findings.       [facility] Adult PT Treatment/Exercise - 10/09/20 0001      Modalities   Modalities Iontophoresis      Iontophoresis   Type of Iontophoresis Dexamethasone    Location left medial joint line    Dose 1 ml    Time 4 hour patch                  PT Education - 10/09/20 1433    Education Details HEP; ionto education    Person(s) Educated Patient    Methods Explanation;Demonstration;Handout    Comprehension Returned demonstration;Verbalized understanding               PT Long Term Goals - 10/09/20 1439      PT LONG TERM GOAL #1   Title patient to be independent with advanced HEP    Time 6    Period Weeks    Status New    Target Date 11/20/20      PT LONG TERM GOAL #2   Title decreased pain in left knee with ADLS and at end of day by 61% or more.    Time 6    Period Weeks    Status New      PT LONG TERM GOAL #3   Title pt to demo improved good hip/knee  strength with step downs and split squats    Time 6    Period Weeks    Status New      PT LONG TERM GOAL #4   Title improved left knee flexion to equal right (138 deg at eval)    Time 6    Period Weeks    Status New                  Plan - 10/09/20 1433    Clinical Impression Statement Patient presents with c/o left ant medial knee pain x 5 weeks after hurting it  playing soccer. She has returned to working out and running, but has pain daily with nomal ADLS and it is worse at end of day. She has visible edema at the medial joint line. She has tightness in her left quad limiting flex compared to right knee. She also has tightness in her right hip. She demonstrates functional weakness with step down (heel taps) and split squats. She will benefit from PT to address these deficits and return her to painfree ADLS. Inital trial of Ionto was done.    Stability/Clinical Decision Making Stable/Uncomplicated    Clinical Decision Making Low    Rehab Potential Excellent    PT Frequency 2x / week    PT Duration 6 weeks    PT Treatment/Interventions ADLs/Self Care Home Management;Cryotherapy;Electrical Stimulation;Iontophoresis 4mg /ml Dexamethasone;Moist Heat;Therapeutic activities;Therapeutic exercise;Neuromuscular re-education;Patient/family education;Manual techniques;Dry needling;Taping    PT Next Visit Plan assesss hip ADDuctor tightness and quads for DN/manual; assess ionto #1, work on step downs and functional hip strength; also clams/reverse clams    PT Home Exercise Plan [email or web address]    Consulted and Agree with Plan of Care Patient           Patient will benefit from skilled therapeutic intervention in order to improve the following deficits and impairments:  Impaired flexibility,Decreased range of motion,Pain,Increased edema,Decreased strength,Increased muscle spasms,Decreased activity tolerance  Visit Diagnosis: Acute pain of left knee - Plan: PT plan of care cert/re-cert     Problem List Patient Active Problem List   Diagnosis Date Noted  . Acute pain of left knee 08/31/2020  . Hypertension 04/20/2020  . Anal fissure 12/01/2019  . Rash and nonspecific skin eruption 04/14/2019  . Lumbar radiculopathy, right 08/25/2018  . Vitamin D deficiency 06/10/2018  . Fatigue 06/10/2018  . Nonallopathic lesion of rib cage 04/01/2018  . Nonallopathic lesion of  sacral region 04/01/2018  . Nonallopathic lesion of cervical region 10/06/2017  . Nonallopathic lesion of thoracic region 10/06/2017  . Nonallopathic lesion of lumbosacral region 10/06/2017  . Migraine headache 09/17/2017  . Anxiety 09/17/2017  . Neck pain 09/17/2017  . Muscle tightness 09/17/2017  . Palpitation 09/17/2017   11/15/2017 PT 10/09/2020, 2:44 PM  [facility] [address] [place], [place], [place] Phone: [phone]   Fax:  [phone]  Name: Tarika Mckethan MRN: [name] Date of Birth: 04-19-1981

## 2020-10-09 NOTE — Patient Instructions (Signed)
Access Code: [email or web address] URL: [email or web address] Date: 10/09/2020 Prepared by: Raynelle Fanning  Exercises Forward Step Down - 1 x daily - 7 x weekly - 3 sets - 10 reps Prone Quadriceps Stretch with Strap - 2-3 x daily - 7 x weekly - 1 sets - 3 reps - 60 sec hold  Patient Education Ionto Patient Instructions

## 2020-10-16 ENCOUNTER — Encounter: Payer: Self-pay | Admitting: Psychiatry

## 2020-10-16 ENCOUNTER — Other Ambulatory Visit: Payer: Self-pay

## 2020-10-16 ENCOUNTER — Ambulatory Visit (INDEPENDENT_AMBULATORY_CARE_PROVIDER_SITE_OTHER): Admitting: Psychiatry

## 2020-10-16 ENCOUNTER — Ambulatory Visit (INDEPENDENT_AMBULATORY_CARE_PROVIDER_SITE_OTHER): Admitting: Physical Therapy

## 2020-10-16 ENCOUNTER — Encounter: Payer: Self-pay | Admitting: Physical Therapy

## 2020-10-16 DIAGNOSIS — M25562 Pain in left knee: Secondary | ICD-10-CM | POA: Diagnosis not present

## 2020-10-16 DIAGNOSIS — F411 Generalized anxiety disorder: Secondary | ICD-10-CM | POA: Diagnosis not present

## 2020-10-16 NOTE — Progress Notes (Signed)
[facility] Counselor/Therapist Progress Note  Patient ID: Margaret Deleon, MRN: [id number],    Date: 10/16/2020  Time Spent: 50 minutes   Treatment Type: Individual Therapy  Reported Symptoms: anxiety  Mental Status Exam:  Appearance:   Casual     Behavior:  Appropriate  Motor:  Normal  Speech/Language:   Clear and Coherent  Affect:  Appropriate  Mood:  anxious  Thought process:  normal  Thought content:    WNL  Sensory/Perceptual disturbances:    WNL  Orientation:  oriented to person, place, time/date and situation  Attention:  Good  Concentration:  Good  Memory:  WNL  Fund of knowledge:   Good  Insight:    Good  Judgment:   Good  Impulse Control:  Good   Risk Assessment: Danger to Self:  No Self-injurious Behavior: No Danger to Others: No Duty to Warn:no Physical Aggression / Violence:No  Access to Firearms a concern: No  Gang Involvement:No   Subjective: The client states that things are little better since she is in her own place.  She complains that her husband still puts her down as a mother.  This still causes her a lot of anxiety and fear.  Today we used eye-movement with the client focusing on her husband.  Her negative cognition is "what will he say about me?"  She feels fear in her chest.  Her subjective units of distress is a 5+.  As the client processed she reiterated the negative beliefs that her husband says about her.  "I put others before my children.  I am a terrible person for leaving him and it hurts to kids.  If I do not do it the way he wants, I am bad.  He gets to set the rules."  As we processed through these things I asked the client if other people were saying these about her would they be right?  She said no.  Then no matter what he shouts from the parking lot does not make it true.  She agreed.  A lot of these happen when she engages in his arguments.  If she tries to stay out of the power struggles she will have more success.  If she does  not respond then he has less power.  This made sense to the client.  The client's expectation is that on some level her husband loves her and will not say terrible things about her.  I asked her if that has happened before?  She says it has not.  I suggested to the client that maybe she needs to expect him to be mean?  As the client processed this, she stated, "no more surprises!  I can let it go."  As the client integrated this she felt so much better.  We also discussed being preemptive with her children and telling them why she will not engage when she is with him.  The client's subjective units of distress was less than 2.  Her positive cognition was, "no more surprises.  I can let it go."  Interventions: Assertiveness/Communication, Motivational Interviewing, Solution-Oriented/Positive Psychology, [employer] Desensitization and Reprocessing (EMDR) and Insight-Oriented  Diagnosis:   ICD-10-CM   1. Generalized anxiety disorder  F41.1     Plan: Positive self talk, self-care, fully expecting negative behavior from husband, preemptively talking to her children about not getting into power struggles, boundaries, assertiveness, mood independent behavior.  Gelene Mink Marializ Ferrebee, [facility]

## 2020-10-16 NOTE — Therapy (Signed)
[facility] [address] [place], [place], [address] Phone: [phone]   Fax:  [phone]  Physical Therapy Treatment  Patient Details  Name: Margaret Deleon MRN: [id number] Date of Birth: 05/15/1981 Referring Provider (PT): Lynne Leader MD   Encounter Date: 10/16/2020   PT End of Session - 10/16/20 1347    Visit Number 2    Number of Visits 12    Date for PT Re-Evaluation 11/20/20    Authorization Type tricare    PT Start Time 1347    PT Stop Time 1427    PT Time Calculation (min) 40 min    Activity Tolerance Patient tolerated treatment well    Behavior During Therapy [facility] for tasks assessed/performed           Past Medical History:  Diagnosis Date  . Anxiety 09/17/2017  . Depression   . Elevated blood pressure reading 10/14/2017   Elevated blood pressure  . Hypertension   . Migraine headache 09/17/2017   Has been on propranolol and labetalol in the past Did not tolerate Topamax Takes Excedrin Migraine as needed, has not tried has tried Fioricet once  . Migraines   . Muscle tightness 09/17/2017  . Neck pain 09/17/2017  . Nonallopathic lesion of cervical region 10/06/2017  . Nonallopathic lesion of lumbosacral region 10/06/2017  . Nonallopathic lesion of thoracic region 10/06/2017  . Palpitations 09/17/2017   Palpitations    Past Surgical History:  Procedure Laterality Date  . BREAST ENHANCEMENT SURGERY    . CESAREAN SECTION      There were no vitals filed for this visit.   Subjective Assessment - 10/16/20 1347    Subjective Pain with palpation and after a run. Ran 4 miles yesterday    Patient Stated Goals get rid of the pain and get back to normal activites 100%    Currently in Pain? Yes    Pain Score 3     Pain Location Knee    Pain Orientation Left    Pain Descriptors / Indicators Aching    Pain Type Acute pain    Pain Onset More than a month ago                             [facility]  Adult PT Treatment/Exercise - 10/16/20 0001      Self-Care   Self-Care Other Self-Care Comments    Other Self-Care Comments  foam rolling Lt thigh in all directions      Modalities   Modalities Electrical Stimulation;Iontophoresis;Moist Heat      Moist Heat Therapy   Number Minutes Moist Heat 10 Minutes    Moist Heat Location --   thigh     Electrical Stimulation   Electrical Stimulation Location quad and adductors with DN      Iontophoresis   Type of Iontophoresis Dexamethasone    Location left medial joint line    Dose 1 ml    Time 4 hour patch      Manual Therapy   Manual Therapy Soft tissue mobilization    Manual therapy comments skilled palpation and monitoring of soft tissue during DN    Soft tissue mobilization STM to Lt quad and hip adductors            Trigger Point Dry Needling - 10/16/20 0001    Consent Given? Yes    Education Handout Provided Yes    Muscles Treated Lower Quadrant Adductor longus/brevis/magnus;Quadriceps  Electrical Stimulation Performed with Dry Needling Yes    Quadriceps Response Palpable increased muscle length;Twitch response elicited    Adductor Response Palpable increased muscle length;Twitch response elicited                     PT Long Term Goals - 10/09/20 1439      PT LONG TERM GOAL #1   Title patient to be independent with advanced HEP    Time 6    Period Weeks    Status New    Target Date 11/20/20      PT LONG TERM GOAL #2   Title decreased pain in left knee with ADLS and at end of day by 75% or more.    Time 6    Period Weeks    Status New      PT LONG TERM GOAL #3   Title pt to demo improved good hip/knee strength with step downs and split squats    Time 6    Period Weeks    Status New      PT LONG TERM GOAL #4   Title improved left knee flexion to equal right (138 deg at eval)    Time 6    Period Weeks    Status New                 Plan - 10/16/20 1430    Clinical Impression Statement  This was Rachels first visit after her eval.  She tolerated manual work and dry needling well with palpable and visible twitches.  FOllowing this with foam rolling decreased alot of her tightness, she would benefit from using a foam roller at home daily.  She reported the ionto patch helped so this was reapplied.  No goals met, only second visit.    Rehab Potential Excellent    PT Frequency 2x / week    PT Duration 6 weeks    PT Treatment/Interventions ADLs/Self Care Home Management    PT Next Visit Plan assess response to DN, pt only able to attend once a week when she is off work.  If better add in eccentric quad work and SLS activities    Bruce and Agree with Plan of Care Patient           Patient will benefit from skilled therapeutic intervention in order to improve the following deficits and impairments:  Impaired flexibility,Decreased range of motion,Pain,Increased edema,Decreased strength,Increased muscle spasms,Decreased activity tolerance  Visit Diagnosis: Acute pain of left knee     Problem List Patient Active Problem List   Diagnosis Date Noted  . Acute pain of left knee 08/31/2020  . Hypertension 04/20/2020  . Anal fissure 12/01/2019  . Rash and nonspecific skin eruption 04/14/2019  . Lumbar radiculopathy, right 08/25/2018  . Vitamin D deficiency 06/10/2018  . Fatigue 06/10/2018  . Nonallopathic lesion of rib cage 04/01/2018  . Nonallopathic lesion of sacral region 04/01/2018  . Nonallopathic lesion of cervical region 10/06/2017  . Nonallopathic lesion of thoracic region 10/06/2017  . Nonallopathic lesion of lumbosacral region 10/06/2017  . Migraine headache 09/17/2017  . Anxiety 09/17/2017  . Neck pain 09/17/2017  . Muscle tightness 09/17/2017  . Palpitation 09/17/2017    Jeral Pinch PT  10/16/2020, 2:32 PM  [facility] [address] [place], [place],  [address] Phone: [phone]   Fax:  [phone]  Name: Sache Sane MRN: [id number] Date of  Birth: [birth year]

## 2020-10-23 ENCOUNTER — Encounter: Admitting: Physical Therapy

## 2020-10-30 ENCOUNTER — Other Ambulatory Visit: Payer: Self-pay

## 2020-10-30 ENCOUNTER — Ambulatory Visit (INDEPENDENT_AMBULATORY_CARE_PROVIDER_SITE_OTHER): Admitting: Physical Therapy

## 2020-10-30 ENCOUNTER — Ambulatory Visit: Admitting: Psychiatry

## 2020-10-30 DIAGNOSIS — M25562 Pain in left knee: Secondary | ICD-10-CM

## 2020-10-30 NOTE — Therapy (Addendum)
[facility], [place], [address] Phone: [phone]   Fax:  [phone]  Physical Therapy Treatment and Discharge Summary  Patient Details  Name: Margaret Deleon MRN: [id number] Date of Birth: [birth year] Referring Provider (PT): Lynne Leader MD   Encounter Date: 10/30/2020   PT End of Session - 10/30/20 1347    Visit Number 3    Number of Visits 12    Date for PT Re-Evaluation 11/20/20    Authorization Type tricare    PT Start Time 1173    PT Stop Time 1435   heat at end of session   PT Time Calculation (min) 47 min    Activity Tolerance Patient tolerated treatment well    Behavior During Therapy [facility] for tasks assessed/performed           Past Medical History:  Diagnosis Date  . Anxiety 09/17/2017  . Depression   . Elevated blood pressure reading 10/14/2017   Elevated blood pressure  . Hypertension   . Migraine headache 09/17/2017   Has been on propranolol and labetalol in the past Did not tolerate Topamax Takes Excedrin Migraine as needed, has not tried has tried Fioricet once  . Migraines   . Muscle tightness 09/17/2017  . Neck pain 09/17/2017  . Nonallopathic lesion of cervical region 10/06/2017  . Nonallopathic lesion of lumbosacral region 10/06/2017  . Nonallopathic lesion of thoracic region 10/06/2017  . Palpitations 09/17/2017   Palpitations    Past Surgical History:  Procedure Laterality Date  . BREAST ENHANCEMENT SURGERY    . CESAREAN SECTION      There were no vitals filed for this visit.   Subjective Assessment - 10/30/20 1349    Subjective Pt reports the DN helped some, is having tightness in the ITB/TFL area.  Still having swelling in the knee area and very frustrated with that.    Patient Stated Goals get rid of the pain and get back to normal activites 100%    Currently in Pain? Yes    Pain Score 3     Pain Location Knee    Pain Orientation Left    Pain Descriptors / Indicators  Aching              [facility] PT Assessment - 10/30/20 0001      Assessment   Medical Diagnosis left ant knee pain      Strength   Overall Strength Comments Lt hip abductors 5/5      Flexibility   ITB WNL with cross leg stretch, did have some stretching with standing stretch after DN      Palpation   Palpation comment continues with edema distal to Lt tibial plataue and medial to tibial tubersosity                         [facility] Adult PT Treatment/Exercise - 10/30/20 0001      Exercises   Exercises --   standing hip CAR Lt and cross body stretch Lt     Modalities   Modalities Iontophoresis      Moist Heat Therapy   Number Minutes Moist Heat 10 Minutes    Moist Heat Location Hip   Lt and thigh     Iontophoresis   Type of Iontophoresis Dexamethasone    Location left medial joint line    Dose 1 ml    Time 4 hour patch      Manual Therapy  Manual therapy comments skilled palpation and monitoring of soft tissue during DN    Soft tissue mobilization STMto Lt lateral thigh, TFL and gluts            Trigger Point Dry Needling - 10/30/20 0001    Consent Given? Yes    Education Handout Provided Previously provided    Muscles Treated Lower Quadrant Vastus lateralis;Quadriceps    Muscles Treated Back/Hip Tensor fascia lata    Quadriceps Response Palpable increased muscle length   Lt lateral   Vastus lateralis Response Palpable increased muscle length;Twitch response elicited   Lt   Tensor Fascia Lata Response Palpable increased muscle length   Lt                    PT Long Term Goals - 10/30/20 1428      PT LONG TERM GOAL #1   Title patient to be independent with advanced HEP    Baseline Pt with good HEP    Status Achieved      PT LONG TERM GOAL #2   Title decreased pain in left knee with ADLS and at end of day by 75% or more.    Status On-going      PT LONG TERM GOAL #3   Title pt to demo improved good hip/knee strength with step downs  and split squats    Status On-going      PT LONG TERM GOAL #4   Title improved left knee flexion to equal right (138 deg at eval)    Status On-going                 Plan - 10/30/20 1429    Clinical Impression Statement Margaret Deleon is a little frustrated as her knee edema and pain continues.  She does report decreased tightness in area that was DN.  Having lateral Lt thigh pain now.  Did have some good releases with manual work to this area.  Edema is visible in knee, today was ionto patch number 3.  If symptoms in knee continue she will return to Md for further assessment.  She has one more scheduled visit with [place]. Expressed desire to perform things in PT that she is unable to do at home on her own.    Rehab Potential Excellent    PT Frequency 1x / week    PT Duration 6 weeks    PT Treatment/Interventions ADLs/Self Care Home Management    PT Next Visit Plan assess for d/c vs continued PT    PT Home Exercise Plan [id number]    Consulted and Agree with Plan of Care Patient           Patient will benefit from skilled therapeutic intervention in order to improve the following deficits and impairments:  Impaired flexibility,Decreased range of motion,Pain,Increased edema,Decreased strength,Increased muscle spasms,Decreased activity tolerance  Visit Diagnosis: Acute pain of left knee     Problem List Patient Active Problem List   Diagnosis Date Noted  . Acute pain of left knee 08/31/2020  . Hypertension 04/20/2020  . Anal fissure 12/01/2019  . Rash and nonspecific skin eruption 04/14/2019  . Lumbar radiculopathy, right 08/25/2018  . Vitamin D deficiency 06/10/2018  . Fatigue 06/10/2018  . Nonallopathic lesion of rib cage 04/01/2018  . Nonallopathic lesion of sacral region 04/01/2018  . Nonallopathic lesion of cervical region 10/06/2017  . Nonallopathic lesion of thoracic region 10/06/2017  . Nonallopathic lesion of lumbosacral region 10/06/2017  . Migraine headache 09/17/2017   .  Anxiety 09/17/2017  . Neck pain 09/17/2017  . Muscle tightness 09/17/2017  . Palpitation 09/17/2017    Jeral Pinch PT  10/30/2020, 2:33 PM  [facility] [address] [place] [place], [place], [address] Phone: [phone]   Fax:  [phone]  Name: Margaret Deleon MRN: [id number] Date of Birth: 15-Aug-1981  PHYSICAL THERAPY DISCHARGE SUMMARY  Visits from Start of Care: 3  Current functional level related to goals / functional outcomes: unknown    Remaining deficits: unknown    Education / Equipment: HEP  Plan: Patient agrees to discharge.  Patient goals were not met. Patient is being discharged due to not returning since the last visit.  ?????     Madelyn Flavors, PT 11/28/20 1:28 PM  [facility] [employer] [place],  [address]  [phone] (office) [phone] (fax)

## 2020-11-06 ENCOUNTER — Other Ambulatory Visit: Payer: Self-pay

## 2020-11-06 ENCOUNTER — Telehealth: Payer: Self-pay | Admitting: Physical Therapy

## 2020-11-06 ENCOUNTER — Encounter: Admitting: Physical Therapy

## 2020-11-06 NOTE — Telephone Encounter (Signed)
Left message for pt regarding missing todays' PT appt.  Requested her to call and advise about continuing or discharge as today was her last scheduled visit.   Roderic Scarce, PT 11/06/20 2:04 PM .

## 2021-04-02 ENCOUNTER — Other Ambulatory Visit: Payer: Self-pay | Admitting: Internal Medicine

## 2021-05-08 ENCOUNTER — Other Ambulatory Visit: Payer: Self-pay

## 2021-05-08 ENCOUNTER — Ambulatory Visit: Payer: Self-pay

## 2021-05-08 ENCOUNTER — Ambulatory Visit (INDEPENDENT_AMBULATORY_CARE_PROVIDER_SITE_OTHER)

## 2021-05-08 ENCOUNTER — Ambulatory Visit (INDEPENDENT_AMBULATORY_CARE_PROVIDER_SITE_OTHER): Admitting: Family Medicine

## 2021-05-08 VITALS — BP 138/90 | HR 78 | Ht 67.0 in | Wt 165.0 lb

## 2021-05-08 DIAGNOSIS — M9908 Segmental and somatic dysfunction of rib cage: Secondary | ICD-10-CM

## 2021-05-08 DIAGNOSIS — M9904 Segmental and somatic dysfunction of sacral region: Secondary | ICD-10-CM | POA: Diagnosis not present

## 2021-05-08 DIAGNOSIS — M9901 Segmental and somatic dysfunction of cervical region: Secondary | ICD-10-CM

## 2021-05-08 DIAGNOSIS — M5416 Radiculopathy, lumbar region: Secondary | ICD-10-CM

## 2021-05-08 DIAGNOSIS — M9903 Segmental and somatic dysfunction of lumbar region: Secondary | ICD-10-CM | POA: Diagnosis not present

## 2021-05-08 DIAGNOSIS — M25562 Pain in left knee: Secondary | ICD-10-CM

## 2021-05-08 DIAGNOSIS — M9902 Segmental and somatic dysfunction of thoracic region: Secondary | ICD-10-CM

## 2021-05-08 NOTE — Assessment & Plan Note (Signed)
Patient's knee exam shows the patient may have some mild osteoarthritic changes of the medial compartment and the patellofemoral.  Trace effusion noted on ultrasound today.  Discussed the potential for injection but patient would like to potentially try more of the hamstring tendinopathy given exercises.  Patient given different exercises, discussed compression and icing regimen.  Patient will follow-up in 4 weeks and if continuing to have pain can then consider injection.

## 2021-05-08 NOTE — Assessment & Plan Note (Signed)
Chronic problem with mild exacerbation.  Has been sometime since we have seen patient.  Has responded well to manipulation previously.  Discussed posture and ergonomics.  Increase activity slowly.  Follow-up again in 6 to 8 weeks

## 2021-05-08 NOTE — Progress Notes (Signed)
[name] [address] [place] [address] Phone: [phone] Subjective:   Margaret Deleon, am serving as a scribe for Dr. Antoine Primas.  I'm seeing this patient by the request  of:  Pincus Sanes, MD  CC: knee pain   [id number]  Orel Hord is a 40 y.o. female coming in with complaint of knee pain. Last seen by Dr. Denyse Amass in 1/22. Patient states that the knee is not necessarily in pain, but there is still tingling and numbness and her knee will get stuck and has to pop it to feel better. She feels like there is something in her medial knee and it is very tight. Patient just wants to know what to do or where to go from here. PT did not help, the injection helped with initial pain but has not resolved all the issues.     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 Had injection in pes 1/28  Past Medical History:  Diagnosis Date   Anxiety 09/17/2017   Depression    Elevated blood pressure reading 10/14/2017   Elevated blood pressure   Hypertension    Migraine headache 09/17/2017   Has been on propranolol and labetalol in the past Did not tolerate Topamax Takes Excedrin Migraine as needed, has not tried has tried Fioricet once   Migraines    Muscle tightness 09/17/2017   Neck pain 09/17/2017   Nonallopathic lesion of cervical region 10/06/2017   Nonallopathic lesion of lumbosacral region 10/06/2017   Nonallopathic lesion of thoracic region 10/06/2017   Palpitations 09/17/2017   Palpitations   Past Surgical History:  Procedure Laterality Date   BREAST ENHANCEMENT SURGERY     CESAREAN SECTION     Social History   Socioeconomic History   Marital status: Married    Spouse name: Not on file   Number of children: 3   Years of education: Not on file   Highest education level: Not on file  Occupational History   Not on file  Tobacco Use   Smoking status: Never   Smokeless tobacco: Never  Vaping Use   Vaping Use: Never used  Substance  and Sexual Activity   Alcohol use: Yes    Comment: occasionally    Drug use: No   Sexual activity: Yes  Other Topics Concern   Not on file  Social History Narrative   Not on file   Social Determinants of Health   Financial Resource Strain: Not on file  Food Insecurity: Not on file  Transportation Needs: Not on file  Physical Activity: Not on file  Stress: Not on file  Social Connections: Not on file   Allergies  Allergen Reactions   Codeine Nausea And Vomiting   Prozac [Fluoxetine Hcl]     Increased anxiety   Topamax [Topiramate]     Felt weird, high like and tired   Family History  Problem Relation Age of Onset   Depression Mother    Cancer Father    Alcohol abuse Brother    Depression Brother    Migraines Sister    Anxiety disorder Sister      Current Outpatient Medications (Cardiovascular):    amLODipine (NORVASC) 2.5 MG tablet, Take 2.5 mg by mouth daily.   verapamil (CALAN-SR) 120 MG CR tablet, TAKE 1 TABLET BY MOUTH AT BEDTIME     Current Outpatient Medications (Other):    cyclobenzaprine (FLEXERIL) 10 MG tablet, TAKE 1/2 TO 1 TABLET(5 TO  10 MG) BY MOUTH AT BEDTIME   gabapentin (NEURONTIN) 100 MG capsule, Take 2 capsules (200 mg total) by mouth at bedtime. (Patient taking differently: Take 200 mg by mouth at bedtime as needed.)   hydrocortisone 2.5 % cream, APPLY EXTERNALLY TO THE AFFECTED AREA TWICE DAILY AS NEEDED   Multiple Vitamin (MULTIVITAMIN) tablet, Take 1 tablet by mouth daily.   PARoxetine (PAXIL) 40 MG tablet, Take 1 tablet (40 mg total) by mouth daily.   prochlorperazine (COMPAZINE) 10 MG tablet, Take 1 tablet (10 mg total) by mouth every 8 (eight) hours as needed for refractory nausea / vomiting (refractory headache).   tinidazole (TINDAMAX) 500 MG tablet, Take 1,000 mg by mouth daily.   traZODone (DESYREL) 100 MG tablet, Take 1/2-1 tabs po QHS prn insomnia   triamcinolone ointment (KENALOG) 0.5 %, Apply 1 application topically 2 (two) times  daily.   Reviewed prior external information including notes and imaging from  primary care provider As well as notes that were available from care everywhere and other healthcare systems.  Past medical history, social, surgical and family history all reviewed in electronic medical record.  No pertanent information unless stated regarding to the chief complaint.   Review of Systems:  No headache, visual changes, nausea, vomiting, diarrhea, constipation, dizziness, abdominal pain, skin rash, fevers, chills, night sweats, weight loss, swollen lymph nodes, body aches, joint swelling, chest pain, shortness of breath, mood changes. POSITIVE muscle aches  Objective  Blood pressure 138/90, pulse 78, height 5\' 7"  (1.702 m), weight 165 lb (74.8 kg), SpO2 99 %.   General: No apparent distress alert and oriented x3 mood and affect normal, dressed appropriately.  HEENT: Pupils equal, extraocular movements intact  Respiratory: Patient's speak in full sentences and does not appear short of breath  Cardiovascular: No lower extremity edema, non tender, no erythema  Gait normal with good balance and coordination.  MSK: Knee exam shows the patient does have some mild tenderness over the medial joint line.  Mild in the popliteal area.  Negative calf squeeze.  Good range of motion of the knee.  Mild lateral tracking of the patella. Back exam does have some mild loss of lordosis.  Tightness noted in the parascapular region right greater than left.  Neck exam very mild tightness with sidebending bilaterally.  Limited muscular skeletal ultrasound was performed and interpreted by , M  Limited ultrasound of patient's left knee shows some very mild hypoechoic changes of the patellofemoral joint with mild narrowing of this as well as the medial joint line.  Patient does have no significant meniscal injury.  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

## 2021-05-08 NOTE — Patient Instructions (Addendum)
Good to see you  Exercises given Thigh compression sleeves Heel lifts given Shorten stride length  See me again in 6 weeks

## 2021-06-22 NOTE — Progress Notes (Deleted)
[name] [address] [place] [address] Phone: [phone] Subjective:    I'm seeing this patient by the request  of:  Pincus Sanes, MD  CC:   [id number]  05/08/2021 Patient's knee exam shows the patient may have some mild osteoarthritic changes of the medial compartment and the patellofemoral.  Trace effusion noted on ultrasound today.  Discussed the potential for injection but patient would like to potentially try more of the hamstring tendinopathy given exercises.  Patient given different exercises, discussed compression and icing regimen.  Patient will follow-up in 4 weeks and if continuing to have pain can then consider injection. Chronic problem with mild exacerbation.  Has been sometime since we have seen patient.  Has responded well to manipulation previously.  Discussed posture and ergonomics.  Increase activity slowly.  Follow-up again in 6 to 8 weeks  Updated 06/25/2021 Larayne Baxley is a 40 y.o. female coming in with complaint of knee pain and LBP  Onset-  Location Duration-  Character- Aggravating factors- Reliving factors-  Therapies tried-  Severity-     Past Medical History:  Diagnosis Date   Anxiety 09/17/2017   Depression    Elevated blood pressure reading 10/14/2017   Elevated blood pressure   Hypertension    Migraine headache 09/17/2017   Has been on propranolol and labetalol in the past Did not tolerate Topamax Takes Excedrin Migraine as needed, has not tried has tried Fioricet once   Migraines    Muscle tightness 09/17/2017   Neck pain 09/17/2017   Nonallopathic lesion of cervical region 10/06/2017   Nonallopathic lesion of lumbosacral region 10/06/2017   Nonallopathic lesion of thoracic region 10/06/2017   Palpitations 09/17/2017   Palpitations   Past Surgical History:  Procedure Laterality Date   BREAST ENHANCEMENT SURGERY     CESAREAN SECTION     Social History   Socioeconomic History   Marital status:  Married    Spouse name: Not on file   Number of children: 3   Years of education: Not on file   Highest education level: Not on file  Occupational History   Not on file  Tobacco Use   Smoking status: Never   Smokeless tobacco: Never  Vaping Use   Vaping Use: Never used  Substance and Sexual Activity   Alcohol use: Yes    Comment: occasionally    Drug use: No   Sexual activity: Yes  Other Topics Concern   Not on file  Social History Narrative   Not on file   Social Determinants of Health   Financial Resource Strain: Not on file  Food Insecurity: Not on file  Transportation Needs: Not on file  Physical Activity: Not on file  Stress: Not on file  Social Connections: Not on file   Allergies  Allergen Reactions   Codeine Nausea And Vomiting   Prozac [Fluoxetine Hcl]     Increased anxiety   Topamax [Topiramate]     Felt weird, high like and tired   Family History  Problem Relation Age of Onset   Depression Mother    Cancer Father    Alcohol abuse Brother    Depression Brother    Migraines Sister    Anxiety disorder Sister      Current Outpatient Medications (Cardiovascular):    amLODipine (NORVASC) 2.5 MG tablet, Take 2.5 mg by mouth daily.   verapamil (CALAN-SR) 120 MG CR tablet, TAKE 1 TABLET BY MOUTH AT BEDTIME  Current Outpatient Medications (Other):    cyclobenzaprine (FLEXERIL) 10 MG tablet, TAKE 1/2 TO 1 TABLET(5 TO 10 MG) BY MOUTH AT BEDTIME   gabapentin (NEURONTIN) 100 MG capsule, Take 2 capsules (200 mg total) by mouth at bedtime. (Patient taking differently: Take 200 mg by mouth at bedtime as needed.)   hydrocortisone 2.5 % cream, APPLY EXTERNALLY TO THE AFFECTED AREA TWICE DAILY AS NEEDED   Multiple Vitamin (MULTIVITAMIN) tablet, Take 1 tablet by mouth daily.   PARoxetine (PAXIL) 40 MG tablet, Take 1 tablet (40 mg total) by mouth daily.   prochlorperazine (COMPAZINE) 10 MG tablet, Take 1 tablet (10 mg total) by mouth every 8 (eight) hours as  needed for refractory nausea / vomiting (refractory headache).   tinidazole (TINDAMAX) 500 MG tablet, Take 1,000 mg by mouth daily.   traZODone (DESYREL) 100 MG tablet, Take 1/2-1 tabs po QHS prn insomnia   triamcinolone ointment (KENALOG) 0.5 %, Apply 1 application topically 2 (two) times daily.   Reviewed prior external information including notes and imaging from  primary care provider As well as notes that were available from care everywhere and other healthcare systems.  Past medical history, social, surgical and family history all reviewed in electronic medical record.  No pertanent information unless stated regarding to the chief complaint.   Review of Systems:  No headache, visual changes, nausea, vomiting, diarrhea, constipation, dizziness, abdominal pain, skin rash, fevers, chills, night sweats, weight loss, swollen lymph nodes, body aches, joint swelling, chest pain, shortness of breath, mood changes. POSITIVE muscle aches  Objective  There were no vitals taken for this visit.   General: No apparent distress alert and oriented x3 mood and affect normal, dressed appropriately.  HEENT: Pupils equal, extraocular movements intact  Respiratory: Patient's speak in full sentences and does not appear short of breath  Cardiovascular: No lower extremity edema, non tender, no erythema  Gait normal with good balance and coordination.  MSK:  Non tender with full range of motion and good stability and symmetric strength and tone of shoulders, elbows, wrist, hip, knee and ankles bilaterally.     Impression and Recommendations:     The above documentation has been reviewed and is accurate and complete Doristine Bosworth

## 2021-06-25 ENCOUNTER — Ambulatory Visit: Admitting: Family Medicine

## 2021-06-30 ENCOUNTER — Other Ambulatory Visit: Payer: Self-pay | Admitting: Internal Medicine

## 2021-07-01 ENCOUNTER — Other Ambulatory Visit: Payer: Self-pay | Admitting: Internal Medicine

## 2022-02-07 ENCOUNTER — Encounter: Payer: Self-pay | Admitting: Internal Medicine

## 2022-02-15 ENCOUNTER — Encounter: Payer: Self-pay | Admitting: Internal Medicine

## 2022-02-15 NOTE — Progress Notes (Signed)
      Subjective:    Patient ID: Margaret Deleon, female    DOB: [birth year], 41 y.o.   MRN: [id number]     HPI Margaret Deleon is here for follow up of her chronic medical problems, including htn, decreased GFR on recent labs   BP has been all over the plast 118/82, 136/88, - rarely over 140/90.    Still has a lot of stress - divorced   She stopped the paxil - it was dulling her too much.    She tends to drink a lot of fluids daily - water.  She infrequently takes nsaids.   Medications and allergies reviewed with patient and updated if appropriate.  Current Outpatient Medications on File Prior to Visit  Medication Sig Dispense Refill   amLODipine (NORVASC) 2.5 MG tablet Take 7.5 mg by mouth daily.     cyclobenzaprine (FLEXERIL) 10 MG tablet TAKE 1/2 TO 1 TABLET(5 TO 10 MG) BY MOUTH AT BEDTIME 30 tablet 3   hydrocortisone 2.5 % cream APPLY EXTERNALLY TO THE AFFECTED AREA TWICE DAILY AS NEEDED     prochlorperazine (COMPAZINE) 10 MG tablet Take 1 tablet (10 mg total) by mouth every 8 (eight) hours as needed for refractory nausea / vomiting (refractory headache). 15 tablet 0   traZODone (DESYREL) 100 MG tablet Take 1/2-1 tabs po QHS prn insomnia 90 tablet 0   No current facility-administered medications on file prior to visit.     Review of Systems     Objective:   Vitals:   02/18/22 0854  BP: (!) 146/90  Pulse: 85  Temp: 98.4 F (36.9 C)  SpO2: 99%   BP Readings from Last 3 Encounters:  02/18/22 (!) 146/90  05/08/21 138/90  09/28/20 118/84   Wt Readings from Last 3 Encounters:  02/18/22 165 lb 9.6 oz (75.1 kg)  05/08/21 165 lb (74.8 kg)  09/28/20 181 lb 9.6 oz (82.4 kg)   Body mass index is 25.94 kg/m.    Physical Exam     Lab Results  Component Value Date   WBC 6.6 12/01/2019   HGB 12.8 12/01/2019   HCT 37.4 12/01/2019   PLT 253.0 12/01/2019   GLUCOSE 85 12/01/2019   CHOL 174 12/01/2019   TRIG 76.0 12/01/2019   HDL 66.90 12/01/2019   LDLCALC 92  12/01/2019   ALT 16 12/01/2019   AST 20 12/01/2019   NA 137 12/01/2019   K 4.5 12/01/2019   CL 103 12/01/2019   CREATININE 1.03 12/01/2019   BUN 10 12/01/2019   CO2 29 12/01/2019   TSH 1.44 12/01/2019     Assessment & Plan:    See Problem List for Assessment and Plan of chronic medical problems.

## 2022-02-18 ENCOUNTER — Ambulatory Visit (INDEPENDENT_AMBULATORY_CARE_PROVIDER_SITE_OTHER): Payer: 59 | Admitting: Internal Medicine

## 2022-02-18 ENCOUNTER — Encounter: Payer: Self-pay | Admitting: Internal Medicine

## 2022-02-18 VITALS — BP 146/90 | HR 85 | Temp 98.4°F | Ht 67.0 in | Wt 165.6 lb

## 2022-02-18 DIAGNOSIS — I10 Essential (primary) hypertension: Secondary | ICD-10-CM

## 2022-02-18 DIAGNOSIS — R944 Abnormal results of kidney function studies: Secondary | ICD-10-CM | POA: Insufficient documentation

## 2022-02-18 DIAGNOSIS — G43009 Migraine without aura, not intractable, without status migrainosus: Secondary | ICD-10-CM

## 2022-02-18 DIAGNOSIS — F419 Anxiety disorder, unspecified: Secondary | ICD-10-CM

## 2022-02-18 LAB — MICROALBUMIN / CREATININE URINE RATIO
Creatinine,U: 64.5 mg/dL
Microalb Creat Ratio: 1.1 mg/g (ref 0.0–30.0)
Microalb, Ur: <0.7 mg/dL (ref 0.0–1.9)

## 2022-02-18 LAB — CBC WITH DIFFERENTIAL/PLATELET
Basophils Absolute: 0.1 10*3/uL (ref 0.0–0.1)
Basophils Relative: 0.9 % (ref 0.0–3.0)
Eosinophils Absolute: 0 10*3/uL (ref 0.0–0.7)
Eosinophils Relative: 0.4 % (ref 0.0–5.0)
HCT: 41.1 % (ref 36.0–46.0)
Hemoglobin: 14.1 g/dL (ref 12.0–15.0)
Lymphocytes Relative: 18.2 % (ref 12.0–46.0)
Lymphs Abs: 1.1 10*3/uL (ref 0.7–4.0)
MCHC: 34.3 g/dL (ref 30.0–36.0)
MCV: 97.9 fl (ref 78.0–100.0)
Monocytes Absolute: 0.5 10*3/uL (ref 0.1–1.0)
Monocytes Relative: 7.9 % (ref 3.0–12.0)
Neutro Abs: 4.2 10*3/uL (ref 1.4–7.7)
Neutrophils Relative %: 72.6 % (ref 43.0–77.0)
Platelets: 246 10*3/uL (ref 150.0–400.0)
RBC: 4.2 Mil/uL (ref 3.87–5.11)
RDW: 11.9 % (ref 11.5–15.5)
WBC: 5.8 10*3/uL (ref 4.0–10.5)

## 2022-02-18 LAB — URINALYSIS, ROUTINE W REFLEX MICROSCOPIC
Bilirubin Urine: NEGATIVE
Hgb urine dipstick: NEGATIVE
Ketones, ur: NEGATIVE
Leukocytes,Ua: NEGATIVE
Nitrite: NEGATIVE
RBC / HPF: NONE SEEN (ref 0–?)
Specific Gravity, Urine: 1.01 (ref 1.000–1.030)
Total Protein, Urine: NEGATIVE
Urine Glucose: NEGATIVE
Urobilinogen, UA: 0.2 (ref 0.0–1.0)
pH: 6 (ref 5.0–8.0)

## 2022-02-18 LAB — COMPREHENSIVE METABOLIC PANEL
ALT: 15 U/L (ref 0–35)
AST: 19 U/L (ref 0–37)
Albumin: 4.7 g/dL (ref 3.5–5.2)
Alkaline Phosphatase: 41 U/L (ref 39–117)
BUN: 11 mg/dL (ref 6–23)
CO2: 28 mEq/L (ref 19–32)
Calcium: 9.6 mg/dL (ref 8.4–10.5)
Chloride: 103 mEq/L (ref 96–112)
Creatinine, Ser: 1.11 mg/dL (ref 0.40–1.20)
GFR: 62.39 mL/min (ref >60.00)
Glucose, Bld: 103 mg/dL — ABNORMAL HIGH (ref 70–99)
Potassium: 4.2 mEq/L (ref 3.5–5.1)
Sodium: 138 mEq/L (ref 135–145)
Total Bilirubin: 0.6 mg/dL (ref 0.2–1.2)
Total Protein: 7.6 g/dL (ref 6.0–8.3)

## 2022-02-18 MED ORDER — AMLODIPINE BESYLATE 5 MG PO TABS: 5.0000 mg | ORAL_TABLET | Freq: Every day | ORAL | Status: DC

## 2022-02-18 MED ORDER — SERTRALINE HCL 25 MG PO TABS: 25.0000 mg | ORAL_TABLET | Freq: Every day | ORAL | 5 refills | Status: DC

## 2022-02-18 MED ORDER — OLMESARTAN MEDOXOMIL 20 MG PO TABS: 20.0000 mg | ORAL_TABLET | Freq: Every day | ORAL | 5 refills | Status: DC

## 2022-02-18 NOTE — Patient Instructions (Addendum)
     Blood work was ordered.     Medications changes include :   olmesartan 20 mg daily, decrease amlodipine to 5 mg daily   Your prescription(s) have been sent to your pharmacy.     Return in about 3 weeks (around 03/11/2022).

## 2022-02-18 NOTE — Progress Notes (Signed)
Subjective:    Patient ID: Margaret Deleon, female    DOB: [birth year], 41 y.o.   MRN: [id number]     HPI Margaret Deleon is here for follow up of her chronic medical problems, including htn  She had recent blood work done by her gynecologist that showed elevated creatinine of 1.19 (0.57-1.0).  BUN was 10 and GFR was 60.  She thinks she was hydrated at that time.  Typically she drinks a good amount of water daily.  She occasionally takes NSAIDs, but has not been taking more than usual.  Her blood pressure is variable.  Sometimes it is elevated.  She is currently taking amlodipine 7.5 mg daily.  Stress level is high.  She is divorced and there is a lot of stresses because of that.  Medications and allergies reviewed with patient and updated if appropriate.  Current Outpatient Medications on File Prior to Visit  Medication Sig Dispense Refill   cyclobenzaprine (FLEXERIL) 10 MG tablet TAKE 1/2 TO 1 TABLET(5 TO 10 MG) BY MOUTH AT BEDTIME 30 tablet 3   hydrocortisone 2.5 % cream APPLY EXTERNALLY TO THE AFFECTED AREA TWICE DAILY AS NEEDED     prochlorperazine (COMPAZINE) 10 MG tablet Take 1 tablet (10 mg total) by mouth every 8 (eight) hours as needed for refractory nausea / vomiting (refractory headache). 15 tablet 0   traZODone (DESYREL) 100 MG tablet Take 1/2-1 tabs po QHS prn insomnia 90 tablet 0   [DISCONTINUED] amLODipine (NORVASC) 2.5 MG tablet Take 7.5 mg by mouth daily.     No current facility-administered medications on file prior to visit.     Review of Systems  Respiratory:  Negative for shortness of breath.   Cardiovascular:  Positive for chest pain (anxiety related). Negative for palpitations and leg swelling.  Genitourinary:  Negative for dysuria, frequency and hematuria.  Musculoskeletal:  Positive for back pain (lower back pain).  Neurological:  Positive for headaches (tension occ, occ migraines). Negative for dizziness and light-headedness.  Psychiatric/Behavioral:   Positive for dysphoric mood. The patient is nervous/anxious.       Objective:   Vitals:   02/18/22 0854  BP: (!) 146/90  Pulse: 85  Temp: 98.4 F (36.9 C)  SpO2: 99%   BP Readings from Last 3 Encounters:  02/18/22 (!) 146/90  05/08/21 138/90  09/28/20 118/84   Wt Readings from Last 3 Encounters:  02/18/22 165 lb 9.6 oz (75.1 kg)  05/08/21 165 lb (74.8 kg)  09/28/20 181 lb 9.6 oz (82.4 kg)   Body mass index is 25.94 kg/m.    Physical Exam Constitutional:      General: She is not in acute distress.    Appearance: Normal appearance.  HENT:     Head: Normocephalic and atraumatic.  Eyes:     Conjunctiva/sclera: Conjunctivae normal.  Cardiovascular:     Rate and Rhythm: Normal rate and regular rhythm.     Heart sounds: Normal heart sounds. No murmur heard. Pulmonary:     Effort: Pulmonary effort is normal. No respiratory distress.     Breath sounds: Normal breath sounds. No wheezing.  Musculoskeletal:     Cervical back: Neck supple.     Right lower leg: No edema.     Left lower leg: No edema.  Lymphadenopathy:     Cervical: No cervical adenopathy.  Skin:    General: Skin is warm and dry.     Findings: No rash.  Neurological:     Mental Status: She is  alert. Mental status is at baseline.  Psychiatric:        Mood and Affect: Mood normal.        Behavior: Behavior normal.       Lab Results  Component Value Date   WBC 6.6 12/01/2019   HGB 12.8 12/01/2019   HCT 37.4 12/01/2019   PLT 253.0 12/01/2019   GLUCOSE 85 12/01/2019   CHOL 174 12/01/2019   TRIG 76.0 12/01/2019   HDL 66.90 12/01/2019   LDLCALC 92 12/01/2019   ALT 16 12/01/2019   AST 20 12/01/2019   NA 137 12/01/2019   K 4.5 12/01/2019   CL 103 12/01/2019   CREATININE 1.03 12/01/2019   BUN 10 12/01/2019   CO2 29 12/01/2019   TSH 1.44 12/01/2019     Assessment & Plan:    See Problem List for Assessment and Plan of chronic medical problems.

## 2022-02-18 NOTE — Assessment & Plan Note (Addendum)
Chronic  Not controlled Not currently on medication and she knows she would benefit from medication, but is reluctant because of the side effect of weight gain-she has worked very hard to lose weight and maintain her weight Start sertraline 25 mg daily

## 2022-02-18 NOTE — Assessment & Plan Note (Signed)
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

## 2022-02-18 NOTE — Assessment & Plan Note (Signed)
Chronic Not ideally controlled Advised blood pressure should ideally be less than 130/80 We will decrease amlodipine to 5 mg daily and add on losartan 20 mg daily Advised her to continue to monitor BP daily at home to make sure blood pressure is well controlled and not too low Will adjust medications as needed CMP, CBC Follow-up in 3 weeks

## 2022-02-21 ENCOUNTER — Encounter: Payer: Self-pay | Admitting: Internal Medicine

## 2022-02-25 MED ORDER — HYDROCHLOROTHIAZIDE 25 MG PO TABS: 25.0000 mg | ORAL_TABLET | Freq: Every day | ORAL | 3 refills | Status: DC

## 2022-02-25 NOTE — Addendum Note (Signed)
Addended by: Pincus Sanes on: 02/25/2022 08:30 PM   Modules accepted: Orders

## 2022-03-11 ENCOUNTER — Encounter: Payer: Self-pay | Admitting: Internal Medicine

## 2022-03-11 DIAGNOSIS — R739 Hyperglycemia, unspecified: Secondary | ICD-10-CM | POA: Insufficient documentation

## 2022-03-12 ENCOUNTER — Ambulatory Visit (INDEPENDENT_AMBULATORY_CARE_PROVIDER_SITE_OTHER): Payer: 59 | Admitting: Internal Medicine

## 2022-03-12 VITALS — BP 142/90 | HR 83 | Temp 98.3°F | Ht 67.0 in | Wt 163.0 lb

## 2022-03-12 DIAGNOSIS — F419 Anxiety disorder, unspecified: Secondary | ICD-10-CM | POA: Diagnosis not present

## 2022-03-12 DIAGNOSIS — I10 Essential (primary) hypertension: Secondary | ICD-10-CM

## 2022-03-12 DIAGNOSIS — R739 Hyperglycemia, unspecified: Secondary | ICD-10-CM

## 2022-03-12 DIAGNOSIS — F5101 Primary insomnia: Secondary | ICD-10-CM

## 2022-03-12 DIAGNOSIS — G4700 Insomnia, unspecified: Secondary | ICD-10-CM | POA: Insufficient documentation

## 2022-03-12 LAB — HEMOGLOBIN A1C: Hgb A1c MFr Bld: 5.2 % (ref 4.6–6.5)

## 2022-03-12 LAB — BASIC METABOLIC PANEL
BUN: 9 mg/dL (ref 6–23)
CO2: 30 mEq/L (ref 19–32)
Calcium: 9.5 mg/dL (ref 8.4–10.5)
Chloride: 99 mEq/L (ref 96–112)
Creatinine, Ser: 0.9 mg/dL (ref 0.40–1.20)
GFR: 80.21 mL/min (ref >60.00)
Glucose, Bld: 104 mg/dL — ABNORMAL HIGH (ref 70–99)
Potassium: 3.8 mEq/L (ref 3.5–5.1)
Sodium: 136 mEq/L (ref 135–145)

## 2022-03-12 MED ORDER — BUPROPION HCL ER (XL) 150 MG PO TB24: 150.0000 mg | ORAL_TABLET | Freq: Every day | ORAL | 5 refills | Status: DC

## 2022-05-12 ENCOUNTER — Other Ambulatory Visit: Payer: Self-pay | Admitting: Internal Medicine

## 2022-05-13 ENCOUNTER — Encounter: Payer: Self-pay | Admitting: Internal Medicine

## 2022-05-14 MED ORDER — CYCLOBENZAPRINE HCL 10 MG PO TABS: ORAL_TABLET | ORAL | 0 refills | Status: DC

## 2022-09-06 ENCOUNTER — Other Ambulatory Visit: Payer: Self-pay | Admitting: Internal Medicine

## 2022-09-07 ENCOUNTER — Other Ambulatory Visit: Payer: Self-pay | Admitting: Internal Medicine

## 2022-09-13 ENCOUNTER — Other Ambulatory Visit: Payer: Self-pay | Admitting: Internal Medicine

## 2022-09-13 MED ORDER — HYDROCORTISONE 2.5 % EX CREA: TOPICAL_CREAM | Freq: Two times a day (BID) | CUTANEOUS | 0 refills | Status: AC | PRN

## 2022-09-30 ENCOUNTER — Encounter: Payer: 59 | Admitting: Internal Medicine

## 2022-10-05 ENCOUNTER — Other Ambulatory Visit: Payer: Self-pay | Admitting: Internal Medicine

## 2022-10-06 ENCOUNTER — Encounter: Payer: Self-pay | Admitting: Internal Medicine

## 2022-10-06 NOTE — Progress Notes (Signed)
Subjective:    Patient ID: Margaret Deleon, female    DOB: 1980/11/10, 42 y.o.   MRN: [id number]      HPI Margaret Deleon is here for a Physical exam.    Was getting weird rashes - she stopped the hctz and the rash went away.     Medications and allergies reviewed with patient and updated if appropriate.  Current Outpatient Medications on File Prior to Visit  Medication Sig Dispense Refill   buPROPion (WELLBUTRIN XL) 150 MG 24 hr tablet TAKE 1 TABLET(150 MG) BY MOUTH DAILY 30 tablet 5   cyclobenzaprine (FLEXERIL) 10 MG tablet TAKE 1/2 TO 1 TABLET(5 TO 10 MG) BY MOUTH AT BEDTIME 90 tablet 0   hydrocortisone 2.5 % cream Apply topically 2 (two) times daily as needed. APPLY EXTERNALLY TO THE AFFECTED AREA TWICE DAILY AS NEEDED 30 g 0   prochlorperazine (COMPAZINE) 10 MG tablet Take 1 tablet (10 mg total) by mouth every 8 (eight) hours as needed for refractory nausea / vomiting (refractory headache). 15 tablet 0   No current facility-administered medications on file prior to visit.    Review of Systems  Constitutional:  Negative for fever.  Eyes:  Negative for visual disturbance.  Respiratory:  Negative for cough, shortness of breath and wheezing.   Cardiovascular:  Negative for chest pain, palpitations and leg swelling.  Gastrointestinal:  Positive for nausea. Negative for abdominal pain, blood in stool, constipation and diarrhea.       No gerd  Genitourinary:  Negative for dysuria.  Musculoskeletal:  Positive for back pain (chronic). Negative for arthralgias.  Skin:  Negative for rash.  Neurological:  Positive for headaches. Negative for dizziness and light-headedness.  Psychiatric/Behavioral:  Positive for dysphoric mood. The patient is nervous/anxious.        Objective:   Vitals:   10/07/22 1305  BP: (!) 142/88  Pulse: 95  Temp: 99.4 F (37.4 C)  SpO2: 99%   Filed Weights   10/07/22 1305  Weight: 170 lb (77.1 kg)   Body mass index is 26.63 kg/m.  BP Readings  from Last 3 Encounters:  10/07/22 (!) 142/88  03/12/22 (!) 142/90  02/18/22 (!) 146/90    Wt Readings from Last 3 Encounters:  10/07/22 170 lb (77.1 kg)  03/12/22 163 lb (73.9 kg)  02/18/22 165 lb 9.6 oz (75.1 kg)       Physical Exam Constitutional: She appears well-developed and well-nourished. No distress.  HENT:  Head: Normocephalic and atraumatic.  Right Ear: External ear normal. Normal ear canal and TM Left Ear: External ear normal.  Normal ear canal and TM Mouth/Throat: Oropharynx is clear and moist.  Eyes: Conjunctivae normal.  Neck: Neck supple. No tracheal deviation present. No thyromegaly present.  No carotid bruit  Cardiovascular: Normal rate, regular rhythm and normal heart sounds.   No murmur heard.  No edema. Pulmonary/Chest: Effort normal and breath sounds normal. No respiratory distress. She has no wheezes. She has no rales.  Breast: deferred   Abdominal: Soft. She exhibits no distension. There is no tenderness.  Lymphadenopathy: She has no cervical adenopathy.  Skin: Skin is warm and dry. She is not diaphoretic.  Psychiatric: She has a normal mood and affect. Her behavior is normal.     Lab Results  Component Value Date   WBC 5.8 02/18/2022   HGB 14.1 02/18/2022   HCT 41.1 02/18/2022   PLT 246.0 02/18/2022   GLUCOSE 104 (H) 03/12/2022   CHOL 174 12/01/2019   TRIG  76.0 12/01/2019   HDL 66.90 12/01/2019   LDLCALC 92 12/01/2019   ALT 15 02/18/2022   AST 19 02/18/2022   NA 136 03/12/2022   K 3.8 03/12/2022   CL 99 03/12/2022   CREATININE 0.90 03/12/2022   BUN 9 03/12/2022   CO2 30 03/12/2022   TSH 1.44 12/01/2019   HGBA1C 5.2 03/12/2022   MICROALBUR <0.7 02/18/2022         Assessment & Plan:   Physical exam: Screening blood work  ordered Exercise  regular Weight  good  Substance abuse  none   Reviewed recommended immunizations.   Health Maintenance  Topic Date Due   MAMMOGRAM  Never done   PAP SMEAR-Modifier  04/25/2024    DTaP/Tdap/Td (2 - Td or Tdap) 08/16/2026   HPV VACCINES  Aged Out   INFLUENZA VACCINE  Discontinued   Hepatitis C Screening  Discontinued   HIV Screening  Discontinued          See Problem List for Assessment and Plan of chronic medical problems.

## 2022-10-06 NOTE — Patient Instructions (Addendum)
Blood work was ordered.   The lab is on the first floor.    Medications changes include :   stop amlodipine.   Start amlodipine-olmesartan 5-20 mg daily   Referral to GI   Return in about 6 months (around 04/07/2023) for follow up.    Health Maintenance, Female Adopting a healthy lifestyle and getting preventive care are important in promoting health and wellness. Ask your health care provider about: The right schedule for you to have regular tests and exams. Things you can do on your own to prevent diseases and keep yourself healthy. What should I know about diet, weight, and exercise? Eat a healthy diet  Eat a diet that includes plenty of vegetables, fruits, low-fat dairy products, and lean protein. Do not eat a lot of foods that are high in solid fats, added sugars, or sodium. Maintain a healthy weight Body mass index (BMI) is used to identify weight problems. It estimates body fat based on height and weight. Your health care provider can help determine your BMI and help you achieve or maintain a healthy weight. Get regular exercise Get regular exercise. This is one of the most important things you can do for your health. Most adults should: Exercise for at least 150 minutes each week. The exercise should increase your heart rate and make you sweat (moderate-intensity exercise). Do strengthening exercises at least twice a week. This is in addition to the moderate-intensity exercise. Spend less time sitting. Even light physical activity can be beneficial. Watch cholesterol and blood lipids Have your blood tested for lipids and cholesterol at 41 years of age, then have this test every 5 years. Have your cholesterol levels checked more often if: Your lipid or cholesterol levels are high. You are older than 41 years of age. You are at high risk for heart disease. What should I know about cancer screening? Depending on your health history and family history, you may need  to have cancer screening at various ages. This may include screening for: Breast cancer. Cervical cancer. Colorectal cancer. Skin cancer. Lung cancer. What should I know about heart disease, diabetes, and high blood pressure? Blood pressure and heart disease High blood pressure causes heart disease and increases the risk of stroke. This is more likely to develop in people who have high blood pressure readings or are overweight. Have your blood pressure checked: Every 3-5 years if you are 17-40 years of age. Every year if you are 24 years old or older. Diabetes Have regular diabetes screenings. This checks your fasting blood sugar level. Have the screening done: Once every three years after age 38 if you are at a normal weight and have a low risk for diabetes. More often and at a younger age if you are overweight or have a high risk for diabetes. What should I know about preventing infection? Hepatitis B If you have a higher risk for hepatitis B, you should be screened for this virus. Talk with your health care provider to find out if you are at risk for hepatitis B infection. Hepatitis C Testing is recommended for: Everyone born from [birth year] through 1964. Anyone with known risk factors for hepatitis C. Sexually transmitted infections (STIs) Get screened for STIs, including gonorrhea and chlamydia, if: You are sexually active and are younger than 41 years of age. You are older than 41 years of age and your health care provider tells you that you are at risk for this type of infection. Your sexual  activity has changed since you were last screened, and you are at increased risk for chlamydia or gonorrhea. Ask your health care provider if you are at risk. Ask your health care provider about whether you are at high risk for HIV. Your health care provider may recommend a prescription medicine to help prevent HIV infection. If you choose to take medicine to prevent HIV, you should first get tested  for HIV. You should then be tested every 3 months for as long as you are taking the medicine. Pregnancy If you are about to stop having your period (premenopausal) and you may become pregnant, seek counseling before you get pregnant. Take 400 to 800 micrograms (mcg) of folic acid every day if you become pregnant. Ask for birth control (contraception) if you want to prevent pregnancy. Osteoporosis and menopause Osteoporosis is a disease in which the bones lose minerals and strength with aging. This can result in bone fractures. If you are 69 years old or older, or if you are at risk for osteoporosis and fractures, ask your health care provider if you should: Be screened for bone loss. Take a calcium or vitamin D supplement to lower your risk of fractures. Be given hormone replacement therapy (HRT) to treat symptoms of menopause. Follow these instructions at home: Alcohol use Do not drink alcohol if: Your health care provider tells you not to drink. You are pregnant, may be pregnant, or are planning to become pregnant. If you drink alcohol: Limit how much you have to: 0-1 drink a day. Know how much alcohol is in your drink. In the [place] one drink equals one 12 oz bottle of beer (355 mL), one 5 oz glass of wine (148 mL), or one 1 oz glass of hard liquor (44 mL). Lifestyle Do not use any products that contain nicotine or tobacco. These products include cigarettes, chewing tobacco, and vaping devices, such as e-cigarettes. If you need help quitting, ask your health care provider. Do not use street drugs. Do not share needles. Ask your health care provider for help if you need support or information about quitting drugs. General instructions Schedule regular health, dental, and eye exams. Stay current with your vaccines. Tell your health care provider if: You often feel depressed. You have ever been abused or do not feel safe at home. Summary Adopting a healthy lifestyle and getting  preventive care are important in promoting health and wellness. Follow your health care provider's instructions about healthy diet, exercising, and getting tested or screened for diseases. Follow your health care provider's instructions on monitoring your cholesterol and blood pressure. This information is not intended to replace advice given to you by your health care provider. Make sure you discuss any questions you have with your health care provider. Document Revised: 01/22/2021 Document Reviewed: 01/22/2021 Elsevier Patient Education  [place].

## 2022-10-07 ENCOUNTER — Other Ambulatory Visit: Payer: Self-pay

## 2022-10-07 ENCOUNTER — Ambulatory Visit (INDEPENDENT_AMBULATORY_CARE_PROVIDER_SITE_OTHER): Payer: 59 | Admitting: Internal Medicine

## 2022-10-07 ENCOUNTER — Encounter: Payer: Self-pay | Admitting: Internal Medicine

## 2022-10-07 VITALS — BP 126/82 | HR 95 | Temp 99.4°F | Ht 67.0 in | Wt 170.0 lb

## 2022-10-07 DIAGNOSIS — R739 Hyperglycemia, unspecified: Secondary | ICD-10-CM

## 2022-10-07 DIAGNOSIS — K6289 Other specified diseases of anus and rectum: Secondary | ICD-10-CM

## 2022-10-07 DIAGNOSIS — F3289 Other specified depressive episodes: Secondary | ICD-10-CM

## 2022-10-07 DIAGNOSIS — F419 Anxiety disorder, unspecified: Secondary | ICD-10-CM | POA: Diagnosis not present

## 2022-10-07 DIAGNOSIS — F32A Depression, unspecified: Secondary | ICD-10-CM | POA: Insufficient documentation

## 2022-10-07 DIAGNOSIS — F5101 Primary insomnia: Secondary | ICD-10-CM

## 2022-10-07 DIAGNOSIS — Z0000 Encounter for general adult medical examination without abnormal findings: Secondary | ICD-10-CM

## 2022-10-07 DIAGNOSIS — I10 Essential (primary) hypertension: Secondary | ICD-10-CM | POA: Diagnosis not present

## 2022-10-07 DIAGNOSIS — E559 Vitamin D deficiency, unspecified: Secondary | ICD-10-CM | POA: Diagnosis not present

## 2022-10-07 DIAGNOSIS — G43009 Migraine without aura, not intractable, without status migrainosus: Secondary | ICD-10-CM

## 2022-10-07 LAB — LIPID PANEL
Cholesterol: 166 mg/dL (ref 0–200)
HDL: 62.1 mg/dL (ref >39.00)
LDL Cholesterol: 78 mg/dL (ref 0–99)
NonHDL: 104.32
Total CHOL/HDL Ratio: 3
Triglycerides: 132 mg/dL (ref 0.0–149.0)
VLDL: 26.4 mg/dL (ref 0.0–40.0)

## 2022-10-07 LAB — COMPREHENSIVE METABOLIC PANEL
ALT: 12 U/L (ref 0–35)
AST: 21 U/L (ref 0–37)
Albumin: 5.1 g/dL (ref 3.5–5.2)
Alkaline Phosphatase: 35 U/L — ABNORMAL LOW (ref 39–117)
BUN: 9 mg/dL (ref 6–23)
CO2: 29 mEq/L (ref 19–32)
Calcium: 9.7 mg/dL (ref 8.4–10.5)
Chloride: 102 mEq/L (ref 96–112)
Creatinine, Ser: 0.98 mg/dL (ref 0.40–1.20)
GFR: 72.13 mL/min (ref >60.00)
Glucose, Bld: 89 mg/dL (ref 70–99)
Potassium: 4.5 mEq/L (ref 3.5–5.1)
Sodium: 138 mEq/L (ref 135–145)
Total Bilirubin: 0.5 mg/dL (ref 0.2–1.2)
Total Protein: 7.9 g/dL (ref 6.0–8.3)

## 2022-10-07 LAB — CBC WITH DIFFERENTIAL/PLATELET
Basophils Absolute: 0 10*3/uL (ref 0.0–0.1)
Basophils Relative: 0.5 % (ref 0.0–3.0)
Eosinophils Absolute: 0 10*3/uL (ref 0.0–0.7)
Eosinophils Relative: 0.7 % (ref 0.0–5.0)
HCT: 40 % (ref 36.0–46.0)
Hemoglobin: 13.6 g/dL (ref 12.0–15.0)
Lymphocytes Relative: 19.1 % (ref 12.0–46.0)
Lymphs Abs: 1.1 10*3/uL (ref 0.7–4.0)
MCHC: 34.1 g/dL (ref 30.0–36.0)
MCV: 98.2 fl (ref 78.0–100.0)
Monocytes Absolute: 0.4 10*3/uL (ref 0.1–1.0)
Monocytes Relative: 7.1 % (ref 3.0–12.0)
Neutro Abs: 4.2 10*3/uL (ref 1.4–7.7)
Neutrophils Relative %: 72.6 % (ref 43.0–77.0)
Platelets: 289 10*3/uL (ref 150.0–400.0)
RBC: 4.07 Mil/uL (ref 3.87–5.11)
RDW: 11.5 % (ref 11.5–15.5)
WBC: 5.7 10*3/uL (ref 4.0–10.5)

## 2022-10-07 LAB — VITAMIN D 25 HYDROXY (VIT D DEFICIENCY, FRACTURES): VITD: 22.12 ng/mL — ABNORMAL LOW (ref 30.00–100.00)

## 2022-10-07 LAB — TSH: TSH: 2.09 u[IU]/mL (ref 0.35–5.50)

## 2022-10-07 LAB — HEMOGLOBIN A1C: Hgb A1c MFr Bld: 5 % (ref 4.6–6.5)

## 2022-10-07 MED ORDER — AMLODIPINE-OLMESARTAN 5-20 MG PO TABS: 1.0000 | ORAL_TABLET | Freq: Every day | ORAL | 3 refills | Status: DC

## 2022-10-07 NOTE — Assessment & Plan Note (Signed)
Chronic Occasional migraines - from muscle tightness in upper back Continue flexeril 10 mg daily prn Continue ibuprofen as needed

## 2022-10-07 NOTE — Assessment & Plan Note (Addendum)
Chronic Fairly controlled, Stable Continue  bupropion XL 150 mg daily

## 2022-10-07 NOTE — Assessment & Plan Note (Signed)
Chronic Controlled, stable Continue bupropion xl 150 mg daily  

## 2022-10-07 NOTE — Assessment & Plan Note (Addendum)
Chronic Blood pressure well controlled CMP Continue start amlodipine-olmesartan 5-20 mg daily

## 2022-10-07 NOTE — Assessment & Plan Note (Signed)
Chronic Check a1c Low sugar / carb diet Stressed regular exercise   

## 2022-10-07 NOTE — Assessment & Plan Note (Signed)
Chronic Fissure ? Hemorrhoid No pain with BM

## 2022-10-07 NOTE — Assessment & Plan Note (Addendum)
Chronic Not taking any medication

## 2022-10-07 NOTE — Assessment & Plan Note (Signed)
Chronic Taking vitamin D daily Check vitamin D level  

## 2023-01-20 LAB — HM MAMMOGRAPHY

## 2023-02-23 ENCOUNTER — Other Ambulatory Visit: Payer: Self-pay | Admitting: Internal Medicine

## 2023-04-10 ENCOUNTER — Other Ambulatory Visit: Payer: Self-pay | Admitting: Internal Medicine

## 2023-04-21 ENCOUNTER — Ambulatory Visit (INDEPENDENT_AMBULATORY_CARE_PROVIDER_SITE_OTHER): Payer: 59 | Admitting: Gastroenterology

## 2023-04-21 ENCOUNTER — Encounter: Payer: Self-pay | Admitting: Gastroenterology

## 2023-04-21 VITALS — BP 122/70 | HR 83 | Ht 67.0 in | Wt 167.0 lb

## 2023-04-21 DIAGNOSIS — K6289 Other specified diseases of anus and rectum: Secondary | ICD-10-CM

## 2023-04-21 DIAGNOSIS — K921 Melena: Secondary | ICD-10-CM

## 2023-04-21 DIAGNOSIS — L290 Pruritus ani: Secondary | ICD-10-CM | POA: Diagnosis not present

## 2023-04-21 MED ORDER — NA SULFATE-K SULFATE-MG SULF 17.5-3.13-1.6 GM/177ML PO SOLN: 1.0000 | Freq: Once | ORAL | 0 refills | Status: AC

## 2023-04-21 NOTE — Patient Instructions (Addendum)
You have been scheduled for a colonoscopy. Please follow written instructions given to you at your visit today.   Please pick up your prep supplies at the pharmacy within the next 1-3 days.  If you use inhalers (even only as needed), please bring them with you on the day of your procedure.  DO NOT TAKE 7 DAYS PRIOR TO TEST- Trulicity (dulaglutide) Ozempic, Wegovy (semaglutide) Mounjaro (tirzepatide) Bydureon Bcise (exanatide extended release)  DO NOT TAKE 1 DAY PRIOR TO YOUR TEST Rybelsus (semaglutide) Adlyxin (lixisenatide) Victoza (liraglutide) Byetta (exanatide) ___________________________________________________________________________  _______________________________________________________  If your blood pressure at your visit was 140/90 or greater, please contact your primary care physician to follow up on this.  _______________________________________________________  If you are age 41 or younger, your body mass index should be between 19-25. Your Body mass index is 26.16 kg/m. If this is out of the aformentioned range listed, please consider follow up with your Primary Care Provider.   __________________________________________________________  The [facility] providers would like to encourage you to use [facility] to communicate with providers for non-urgent requests or questions.  Due to long hold times on the telephone, sending your provider a message by [facility] may be a faster and more efficient way to get a response.  Please allow 48 business hours for a response.  Please remember that this is for non-urgent requests.   Due to recent changes in healthcare laws, you may see the results of your imaging and laboratory studies on MyChart before your provider has had a chance to review them.  We understand that in some cases there may be results that are confusing or concerning to you. Not all laboratory results come back in the same time frame and the provider may be waiting for  multiple results in order to interpret others.  Please give [place] 48 hours in order for your provider to thoroughly review all the results before contacting the office for clarification of your results.     Thank you for choosing me and [facility].  Vito Cirigliano, D.O.

## 2023-04-21 NOTE — Progress Notes (Unsigned)
Chief Complaint: Rectal pain   Referring Provider:     Pincus Sanes, MD   HPI:     Margaret Deleon is a 42 y.o. female with a history of HTN, anxiety, depression, referred to the Gastroenterology Clinic for evaluation of rectal pain.  Has had hemorrhoidal sxs for years with intermittent flares (itching, burning, scant BRB). Thinks she has had fissures in the past as well.   No sxs currently.   Works in ultrasound.   No previous colonoscopy.   No known family history of CRC, GI malignancy, liver disease, pancreatic disease, or IBD.         Latest Ref Rng & Units 10/07/2022    1:52 PM 02/18/2022    9:48 AM 12/01/2019    4:43 PM  CBC  WBC 4.0 - 10.5 K/uL 5.7  5.8  6.6   Hemoglobin 12.0 - 15.0 g/dL 47.4  25.9  56.3   Hematocrit 36.0 - 46.0 % 40.0  41.1  37.4   Platelets 150.0 - 400.0 K/uL 289.0  246.0  253.0       Latest Ref Rng & Units 10/07/2022    1:52 PM 03/12/2022    1:43 PM 02/18/2022    9:48 AM  CMP  Glucose 70 - 99 mg/dL 89  875  643   BUN 6 - 23 mg/dL 9  9  11    Creatinine 0.40 - 1.20 mg/dL 3.29  5.18  8.41   Sodium 135 - 145 mEq/L 138  136  138   Potassium 3.5 - 5.1 mEq/L 4.5  3.8  4.2   Chloride 96 - 112 mEq/L 102  99  103   CO2 19 - 32 mEq/L 29  30  28    Calcium 8.4 - 10.5 mg/dL 9.7  9.5  9.6   Total Protein 6.0 - 8.3 g/dL 7.9   7.6   Total Bilirubin 0.2 - 1.2 mg/dL 0.5   0.6   Alkaline Phos 39 - 117 U/L 35   41   AST 0 - 37 U/L 21   19   ALT 0 - 35 U/L 12   15      Past Medical History:  Diagnosis Date   Anxiety 09/17/2017   Depression    Elevated blood pressure reading 10/14/2017   Elevated blood pressure   Hypertension    Migraine headache 09/17/2017   Has been on propranolol and labetalol in the past Did not tolerate Topamax Takes Excedrin Migraine as needed, has not tried has tried Fioricet once   Migraines    Muscle tightness 09/17/2017   Neck pain 09/17/2017   Nonallopathic lesion of cervical region 10/06/2017   Nonallopathic  lesion of lumbosacral region 10/06/2017   Nonallopathic lesion of thoracic region 10/06/2017   Palpitations 09/17/2017   Palpitations     Past Surgical History:  Procedure Laterality Date   BREAST ENHANCEMENT SURGERY     CESAREAN SECTION     Family History  Problem Relation Age of Onset   Depression Mother    Cancer Father    Alcohol abuse Brother    Depression Brother    Migraines Sister    Anxiety disorder Sister    Social History   Tobacco Use   Smoking status: Never   Smokeless tobacco: Never  Vaping Use   Vaping status: Never Used  Substance Use Topics   Alcohol use: Yes    Comment: occasionally  Drug use: No   Current Outpatient Medications  Medication Sig Dispense Refill   cyclobenzaprine (FLEXERIL) 10 MG tablet TAKE 1/2 TO 1 TABLET(5 TO 10 MG) BY MOUTH AT BEDTIME 90 tablet 0   hydrocortisone 2.5 % cream Apply topically 2 (two) times daily as needed. APPLY EXTERNALLY TO THE AFFECTED AREA TWICE DAILY AS NEEDED 30 g 0   prochlorperazine (COMPAZINE) 10 MG tablet Take 1 tablet (10 mg total) by mouth every 8 (eight) hours as needed for refractory nausea / vomiting (refractory headache). 15 tablet 0   sertraline (ZOLOFT) 50 MG tablet Take 50 mg by mouth daily.     amLODipine-olmesartan (AZOR) 5-20 MG tablet TAKE 1 TABLET BY MOUTH DAILY (Patient not taking: Reported on 04/21/2023) 30 tablet 3   buPROPion (WELLBUTRIN XL) 150 MG 24 hr tablet TAKE 1 TABLET(150 MG) BY MOUTH DAILY (Patient not taking: Reported on 04/21/2023) 90 tablet 1   No current facility-administered medications for this visit.   Allergies  Allergen Reactions   Codeine Nausea And Vomiting   Hydrochlorothiazide Rash   Prozac [Fluoxetine Hcl]     Increased anxiety   Topamax [Topiramate]     Felt weird, high like and tired     Review of Systems: All systems reviewed and negative except where noted in HPI.     Physical Exam:    Wt Readings from Last 3 Encounters:  04/21/23 167 lb (75.8 kg)   10/07/22 170 lb (77.1 kg)  03/12/22 163 lb (73.9 kg)    BP 122/70   Pulse 83   Ht 5\' 7"  (1.702 m)   Wt 167 lb (75.8 kg)   BMI 26.16 kg/m  Constitutional:  Pleasant, in no acute distress. Psychiatric: Normal mood and affect. Behavior is normal. Abdominal: Soft, nondistended, nontender.  Neurological: Alert and oriented to person place and time. Skin: Skin is warm and dry. No rashes noted. Rectal: Exam deferred to time of colonoscopy.    ASSESSMENT AND PLAN;   1) Rectal pain 2) Hematochezia 3) Pruritus ani Intermittent perirectal symptoms with intermittent BRBPR.  Discussed full DDx today with plan for the following: - Schedule colonoscopy to evaluate for more proximal etiology - If clinically significant hemorrhoids, briefly discussed role/utility of hemorrhoid banding - If recurrence of symptoms or presence of fissure, plan for topical therapy  The indications, risks, and benefits of colonoscopy were explained to the patient in detail. Risks include but are not limited to bleeding, perforation, adverse reaction to medications, and cardiopulmonary compromise. Sequelae include but are not limited to the possibility of surgery, hospitalization, and mortality. The patient verbalized understanding and wished to proceed. All questions answered, referred to the scheduler and bowel prep ordered. Further recommendations pending results of the exam.     Shellia Cleverly, DO, FACG  04/21/2023, 1:32 PM   Burns, Bobette Mo, MD

## 2023-04-22 ENCOUNTER — Other Ambulatory Visit: Payer: Self-pay | Admitting: Internal Medicine

## 2023-05-19 ENCOUNTER — Encounter: Payer: Self-pay | Admitting: Certified Registered Nurse Anesthetist

## 2023-05-26 ENCOUNTER — Ambulatory Visit (AMBULATORY_SURGERY_CENTER): Payer: 59 | Admitting: Gastroenterology

## 2023-05-26 ENCOUNTER — Encounter: Payer: Self-pay | Admitting: Gastroenterology

## 2023-05-26 VITALS — BP 104/60 | HR 72 | Temp 99.3°F | Resp 11 | Ht 67.0 in | Wt 167.0 lb

## 2023-05-26 DIAGNOSIS — K6289 Other specified diseases of anus and rectum: Secondary | ICD-10-CM

## 2023-05-26 DIAGNOSIS — K921 Melena: Secondary | ICD-10-CM | POA: Diagnosis present

## 2023-05-26 DIAGNOSIS — L290 Pruritus ani: Secondary | ICD-10-CM

## 2023-05-26 DIAGNOSIS — K641 Second degree hemorrhoids: Secondary | ICD-10-CM

## 2023-05-26 MED ORDER — SODIUM CHLORIDE 0.9 % IV SOLN
500.0000 mL | INTRAVENOUS | Status: DC
Start: 2023-05-26 — End: 2023-05-26

## 2023-05-26 NOTE — Progress Notes (Signed)
Report given to PACU, vss 

## 2023-05-26 NOTE — Progress Notes (Signed)
Pt's states no medical or surgical changes since previsit or office visit. 

## 2023-05-26 NOTE — Op Note (Signed)
[facility] Patient Name: Margaret Deleon Procedure Date: 05/26/2023 2:39 PM MRN: [id number] Endoscopist: Doristine Locks , MD, [id number] Age: 42 Referring MD:  Date of Birth: 12-23-1980 Gender: Female Account #: [id number] Procedure:                Colonoscopy Indications:              Rectal pain, intermittent BRBPR Medicines:                Monitored Anesthesia Care Procedure:                Pre-Anesthesia Assessment:                           - Prior to the procedure, a History and Physical                            was performed, and patient medications and                            allergies were reviewed. The patient's tolerance of                            previous anesthesia was also reviewed. The risks                            and benefits of the procedure and the sedation                            options and risks were discussed with the patient.                            All questions were answered, and informed consent                            was obtained. Prior Anticoagulants: The patient has                            taken no anticoagulant or antiplatelet agents. ASA                            Grade Assessment: II - A patient with mild systemic                            disease. After reviewing the risks and benefits,                            the patient was deemed in satisfactory condition to                            undergo the procedure.                           After obtaining informed consent, the colonoscope  was passed under direct vision. Throughout the                            procedure, the patient's blood pressure, pulse, and                            oxygen saturations were monitored continuously. The                            CF [id number] #[id number] was introduced through the anus                            and advanced to the the cecum, identified by                            appendiceal orifice and  ileocecal valve. The                            colonoscopy was performed without difficulty. The                            patient tolerated the procedure well. The quality                            of the bowel preparation was good. The ileocecal                            valve, appendiceal orifice, and rectum were                            photographed. Scope In: 2:53:44 PM Scope Out: 3:09:30 PM Scope Withdrawal Time: 0 hours 10 minutes 13 seconds  Total Procedure Duration: 0 hours 15 minutes 46 seconds  Findings:                 Skin tags were found on perianal exam.                           The entire colon appeared normal.                           Non-bleeding internal hemorrhoids were found during                            retroflexion. The hemorrhoids were small and Grade                            II (internal hemorrhoids that prolapse but reduce                            spontaneously). Complications:            No immediate complications. Estimated Blood Loss:     Estimated blood loss: none. Impression:               - Perianal skin tags found on perianal  exam.                           - The entire examined colon is normal.                           - Non-bleeding internal hemorrhoids.                           - No specimens collected. Recommendation:           - Patient has a contact number available for                            emergencies. The signs and symptoms of potential                            delayed complications were discussed with the                            patient. Return to normal activities tomorrow.                            Written discharge instructions were provided to the                            patient.                           - Resume previous diet.                           - Continue present medications.                           - Repeat colonoscopy in 10 years for screening                            purposes.                            - Return to GI clinic PRN.                           - Internal hemorrhoids were noted on this study and                            may be amenable to hemorrhoid band ligation. If you                            are interested in further treatment of these                            hemorrhoids with band ligation, please contact my                            clinic to set up  an appointment for evaluation and                            treatment. Doristine Locks, MD 05/26/2023 3:15:18 PM

## 2023-05-26 NOTE — Progress Notes (Signed)
GASTROENTEROLOGY PROCEDURE H&P NOTE   Primary Care Physician: Pincus Sanes, MD    Reason for Procedure:  Rectal pain, BRBPR  Plan:    Colonoscopy  Patient is appropriate for endoscopic procedure(s) in the ambulatory ([facility]) setting.  The nature of the procedure, as well as the risks, benefits, and alternatives were carefully and thoroughly reviewed with the patient. Ample time for discussion and questions allowed. The patient understood, was satisfied, and agreed to proceed.     HPI: Margaret Deleon is a 42 y.o. female who presents for colonoscopy for evaluation of rectal pain.  Does have history of hemorrhoidal symptoms to include intermittent BRBPR, itching, burning.  No previous colonoscopy.  Past Medical History:  Diagnosis Date   Anxiety 09/17/2017   Depression    Elevated blood pressure reading 10/14/2017   Elevated blood pressure   Hypertension    Migraine headache 09/17/2017   Has been on propranolol and labetalol in the past Did not tolerate Topamax Takes Excedrin Migraine as needed, has not tried has tried Fioricet once   Migraines    Muscle tightness 09/17/2017   Neck pain 09/17/2017   Nonallopathic lesion of cervical region 10/06/2017   Nonallopathic lesion of lumbosacral region 10/06/2017   Nonallopathic lesion of thoracic region 10/06/2017   Palpitations 09/17/2017   Palpitations    Past Surgical History:  Procedure Laterality Date   BREAST ENHANCEMENT SURGERY     CESAREAN SECTION      Prior to Admission medications   Medication Sig Start Date End Date Taking? Authorizing Provider  amLODipine-olmesartan (AZOR) 5-20 MG tablet TAKE 1 TABLET BY MOUTH DAILY 02/24/23  Yes Burns, Bobette Mo, MD  buPROPion (WELLBUTRIN XL) 150 MG 24 hr tablet TAKE 1 TABLET(150 MG) BY MOUTH DAILY 04/10/23  Yes Burns, Bobette Mo, MD  sertraline (ZOLOFT) 50 MG tablet Take 50 mg by mouth daily.   Yes [provider]  cyclobenzaprine (FLEXERIL) 10 MG tablet TAKE 1/2 TO 1 TABLET(5 TO 10 MG)  BY MOUTH AT BEDTIME 04/23/23   Burns, Bobette Mo, MD  hydrocortisone 2.5 % cream Apply topically 2 (two) times daily as needed. APPLY EXTERNALLY TO THE AFFECTED AREA TWICE DAILY AS NEEDED 09/13/22   Pincus Sanes, MD  prochlorperazine (COMPAZINE) 10 MG tablet Take 1 tablet (10 mg total) by mouth every 8 (eight) hours as needed for refractory nausea / vomiting (refractory headache). 09/08/17   Shaune Pollack, MD    Current Outpatient Medications  Medication Sig Dispense Refill   amLODipine-olmesartan (AZOR) 5-20 MG tablet TAKE 1 TABLET BY MOUTH DAILY 30 tablet 3   buPROPion (WELLBUTRIN XL) 150 MG 24 hr tablet TAKE 1 TABLET(150 MG) BY MOUTH DAILY 90 tablet 1   sertraline (ZOLOFT) 50 MG tablet Take 50 mg by mouth daily.     cyclobenzaprine (FLEXERIL) 10 MG tablet TAKE 1/2 TO 1 TABLET(5 TO 10 MG) BY MOUTH AT BEDTIME 45 tablet 0   hydrocortisone 2.5 % cream Apply topically 2 (two) times daily as needed. APPLY EXTERNALLY TO THE AFFECTED AREA TWICE DAILY AS NEEDED 30 g 0   prochlorperazine (COMPAZINE) 10 MG tablet Take 1 tablet (10 mg total) by mouth every 8 (eight) hours as needed for refractory nausea / vomiting (refractory headache). 15 tablet 0   Current Facility-Administered Medications  Medication Dose Route Frequency Provider Last Rate Last Admin   0.9 %  sodium chloride infusion  500 mL Intravenous Continuous Delaine Canter V, DO        Allergies as of  05/26/2023 - Review Complete 05/26/2023  Allergen Reaction Noted   Codeine Nausea And Vomiting 09/08/2017   Hydrochlorothiazide Rash 10/07/2022   Prozac [fluoxetine hcl]  08/05/2019   Topamax [topiramate]  09/29/2017    Family History  Problem Relation Age of Onset   Depression Mother    Cancer Father    Migraines Sister    Anxiety disorder Sister    Alcohol abuse Brother    Depression Brother    Esophageal cancer Neg Hx    Liver cancer Neg Hx    Pancreatic cancer Neg Hx    Rectal cancer Neg Hx    Breast cancer Neg Hx      Social History   Socioeconomic History   Marital status: Significant Other    Spouse name: Not on file   Number of children: 3   Years of education: Not on file   Highest education level: Not on file  Occupational History   Occupation: sonographer  Tobacco Use   Smoking status: Never   Smokeless tobacco: Never  Vaping Use   Vaping status: Never Used  Substance and Sexual Activity   Alcohol use: Yes    Comment: occasionally    Drug use: No   Sexual activity: Yes  Other Topics Concern   Not on file  Social History Narrative   Not on file   Social Determinants of Health   Financial Resource Strain: Not on file  Food Insecurity: Not on file  Transportation Needs: Not on file  Physical Activity: Not on file  Stress: Not on file  Social Connections: Not on file  Intimate Partner Violence: Not on file    Physical Exam: Vital signs in last 24 hours: @BP  119/69   Pulse 82   Temp 99.3 F (37.4 C)   Ht 5\' 7"  (1.702 m)   Wt 167 lb (75.8 kg)   SpO2 100%   BMI 26.16 kg/m  GEN: NAD EYE: Sclerae anicteric ENT: MMM CV: Non-tachycardic Pulm: CTA b/l GI: Soft, NT/ND NEURO:  Alert & Oriented x 3   Doristine Locks, DO [facility]   05/26/2023 2:45 PM

## 2023-05-26 NOTE — Patient Instructions (Signed)
Please read handouts provided. Continue present medications. Repeat colonoscopy in 10 years for screening. Return to [facility] clinic as needed.   YOU HAD AN ENDOSCOPIC PROCEDURE TODAY AT THE [facility]:   Refer to the procedure report that was given to you for any specific questions about what was found during the examination.  If the procedure report does not answer your questions, please call your gastroenterologist to clarify.  If you requested that your care partner not be given the details of your procedure findings, then the procedure report has been included in a sealed envelope for you to review at your convenience later.  YOU SHOULD EXPECT: Some feelings of bloating in the abdomen. Passage of more gas than usual.  Walking can help get rid of the air that was put into your GI tract during the procedure and reduce the bloating. If you had a lower endoscopy (such as a colonoscopy or flexible sigmoidoscopy) you may notice spotting of blood in your stool or on the toilet paper. If you underwent a bowel prep for your procedure, you may not have a normal bowel movement for a few days.  Please Note:  You might notice some irritation and congestion in your nose or some drainage.  This is from the oxygen used during your procedure.  There is no need for concern and it should clear up in a day or so.  SYMPTOMS TO REPORT IMMEDIATELY:  Following lower endoscopy (colonoscopy or flexible sigmoidoscopy):  Excessive amounts of blood in the stool  Significant tenderness or worsening of abdominal pains  Swelling of the abdomen that is new, acute  Fever of 100F or higher  For urgent or emergent issues, a gastroenterologist can be reached at any hour by calling [phone]. Do not use MyChart messaging for urgent concerns.    DIET:  We do recommend a small meal at first, but then you may proceed to your regular diet.  Drink plenty of fluids but you should avoid alcoholic beverages for 24  hours.  ACTIVITY:  You should plan to take it easy for the rest of today and you should NOT DRIVE or use heavy machinery until tomorrow (because of the sedation medicines used during the test).    FOLLOW UP: Our staff will call the number listed on your records the next business day following your procedure.  We will call around 7:15- 8:00 am to check on you and address any questions or concerns that you may have regarding the information given to you following your procedure. If we do not reach you, we will leave a message.     If any biopsies were taken you will be contacted by phone or by letter within the next 1-3 weeks.  Please call us at [phone] if you have not heard about the biopsies in 3 weeks.    SIGNATURES/CONFIDENTIALITY: You and/or your care partner have signed paperwork which will be entered into your electronic medical record.  These signatures attest to the fact that that the information above on your After Visit Summary has been reviewed and is understood.  Full responsibility of the confidentiality of this discharge information lies with you and/or your care-partner.

## 2023-05-27 ENCOUNTER — Telehealth: Payer: Self-pay

## 2023-05-27 ENCOUNTER — Telehealth: Payer: Self-pay | Admitting: *Deleted

## 2023-05-27 NOTE — Telephone Encounter (Signed)
  Follow up Call-     05/26/2023    2:16 PM  Call back number  Post procedure Call Back phone  # [phone]  Permission to leave phone message Yes     Patient questions:  Do you have a fever, pain , or abdominal swelling? No. Pain Score  0 *  Have you tolerated food without any problems? Yes.    Have you been able to return to your normal activities? Yes.    Do you have any questions about your discharge instructions: Diet   No. Medications  No. Follow up visit  No.  Do you have questions or concerns about your Care? No.  Actions: * If pain score is 4 or above: No action needed, pain <4.

## 2023-05-27 NOTE — Telephone Encounter (Signed)
-----   Message from Nurse Deno Etienne sent at 05/26/2023  4:48 PM EDT ----- Call pt with appt ----- Message ----- From: Shellia Cleverly, DO Sent: 05/26/2023   3:41 PM EDT To: Richardson Chiquito, RN  Can you schedule this patient for hemorrhoid banding w me in the office? Thanks.

## 2023-05-27 NOTE — Telephone Encounter (Signed)
Left message for patient to call back. She is scheduled for banding #1 on 07/16/23 and banding #2 on 08/22/23.

## 2023-05-28 NOTE — Telephone Encounter (Signed)
Left message for patient to call back  

## 2023-07-08 ENCOUNTER — Other Ambulatory Visit: Payer: Self-pay | Admitting: Internal Medicine

## 2023-07-16 ENCOUNTER — Encounter: Payer: 59 | Admitting: Gastroenterology

## 2023-07-28 ENCOUNTER — Ambulatory Visit (INDEPENDENT_AMBULATORY_CARE_PROVIDER_SITE_OTHER): Payer: 59 | Admitting: Gastroenterology

## 2023-07-28 ENCOUNTER — Encounter: Payer: Self-pay | Admitting: Gastroenterology

## 2023-07-28 VITALS — BP 110/70 | HR 103 | Ht 67.0 in | Wt 173.0 lb

## 2023-07-28 DIAGNOSIS — K641 Second degree hemorrhoids: Secondary | ICD-10-CM | POA: Diagnosis not present

## 2023-07-28 NOTE — Progress Notes (Signed)
Chief Complaint:    Symptomatic Internal Hemorrhoids; Hemorrhoid Band Ligation  GI History: 42 y.o. female with a history of HTN, anxiety, depression, follows in the GI clinic for symptomatic hemorrhoids (pruritus ani, burning, scant BRBPR).  - 05/26/2023: Colonoscopy: Normal colon, small grade 2 internal hemorrhoids.  Repeat 10 years  HPI:     Patient is a 42 y.o. femalewith a history of symptomatic internal hemorrhoids presenting to the Gastroenterology Clinic for follow-up and ongoing treatment. The patient presents with symptomatic grade 2 hemorrhoids, unresponsive to maximal medical therapy, requesting rubber band ligation of symptomatic hemorrhoidal disease.  Completed colonoscopy in 05/2023 as above.  Otherwise no change in medical or surgical history, medications, allergies, social history since last appointment with me.   Review of systems:     No chest pain, no SOB, no fevers, no urinary sx   Past Medical History:  Diagnosis Date   Anxiety 09/17/2017   Depression    Elevated blood pressure reading 10/14/2017   Elevated blood pressure   Hypertension    Migraine headache 09/17/2017   Has been on propranolol and labetalol in the past Did not tolerate Topamax Takes Excedrin Migraine as needed, has not tried has tried Fioricet once   Migraines    Muscle tightness 09/17/2017   Neck pain 09/17/2017   Nonallopathic lesion of cervical region 10/06/2017   Nonallopathic lesion of lumbosacral region 10/06/2017   Nonallopathic lesion of thoracic region 10/06/2017   Palpitations 09/17/2017   Palpitations    Patient's surgical history, family medical history, social history, medications and allergies were all reviewed in Epic    Current Outpatient Medications  Medication Sig Dispense Refill   amLODipine-olmesartan (AZOR) 5-20 MG tablet TAKE 1 TABLET BY MOUTH DAILY 30 tablet 3   buPROPion (WELLBUTRIN XL) 150 MG 24 hr tablet TAKE 1 TABLET(150 MG) BY MOUTH DAILY 90 tablet 1   cyclobenzaprine  (FLEXERIL) 10 MG tablet TAKE 1/2 TO 1 TABLET(5 TO 10 MG) BY MOUTH AT BEDTIME 45 tablet 0   hydrocortisone 2.5 % cream Apply topically 2 (two) times daily as needed. APPLY EXTERNALLY TO THE AFFECTED AREA TWICE DAILY AS NEEDED 30 g 0   prochlorperazine (COMPAZINE) 10 MG tablet Take 1 tablet (10 mg total) by mouth every 8 (eight) hours as needed for refractory nausea / vomiting (refractory headache). 15 tablet 0   sertraline (ZOLOFT) 50 MG tablet Take 50 mg by mouth daily.     No current facility-administered medications for this visit.    Physical Exam:     BP 110/70   Pulse (!) 103   Ht 5\' 7"  (1.702 m)   Wt 173 lb (78.5 kg)   BMI 27.10 kg/m   GENERAL:  Pleasant female in NAD PSYCH: : Cooperative, normal affect NEURO: Alert and oriented x 3, no focal neurologic deficits Rectal exam: Sensation intact and preserved anal wink.  Small external skin tags.  Grade 2 hemorrhoids noted in LL and RA position, and grade 1 hemorrhoid and RP position on anoscopy.  No external anal fissures noted. Normal sphincter tone. No palpable mass. No blood on the exam glove. (Chaperone: Thurshell Ratliff, CMA).   IMPRESSION and PLAN:    #1.  Symptomatic internal hemorrhoids: PROCEDURE NOTE: The patient presents with symptomatic grade 2 hemorrhoids, unresponsive to maximal medical therapy, requesting rubber band ligation of symptomatic hemorrhoidal disease.  All risks, benefits and alternative forms of therapy were described and informed consent was obtained.  In the Left Lateral Decubitus position, anoscopic examination revealed  grade 2 hemorrhoids in the LL and RA position, and grade 1 hemorrhoid column in the RP position on anoscopy.  The anorectum was pre-medicated with RectiCare. The decision was made to band the LL internal hemorrhoid, and the [facility] System was used to perform band ligation without complication.  Digital anorectal examination was then performed to assure proper positioning of the  band, and to adjust the banded tissue as required.  The patient was discharged home without pain or other issues.  Dietary and behavioral recommendations were given and along with follow-up instructions.     The following adjunctive treatments were recommended:  -Resume high-fiber diet with fiber supplement (i.e. Citrucel or Benefiber) with goal for soft stools without straining to have a BM. -Resume adequate fluid intake.  The patient will return in 4+ weeks for follow-up and possible additional banding as required. No complications were encountered and the patient tolerated the procedure well.        Verlin Dike Reakwon Barren ,DO, FACG 07/28/2023, 11:26 AM

## 2023-07-28 NOTE — Patient Instructions (Signed)
_______________________________________________________  If your blood pressure at your visit was 140/90 or greater, please contact your primary care physician to follow up on this.  _______________________________________________________  If you are age 41 or older, your body mass index should be between 23-30. Your Body mass index is 27.1 kg/m. If this is out of the aforementioned range listed, please consider follow up with your Primary Care Provider.  If you are age 50 or younger, your body mass index should be between 19-25. Your Body mass index is 27.1 kg/m. If this is out of the aformentioned range listed, please consider follow up with your Primary Care Provider.   ________________________________________________________  The  GI providers would like to encourage you to use [facility] to communicate with providers for non-urgent requests or questions.  Due to long hold times on the telephone, sending your provider a message by [facility] may be a faster and more efficient way to get a response.  Please allow 48 business hours for a response.  Please remember that this is for non-urgent requests.  _______________________________________________________  Etna Lions PROCEDURE    FOLLOW-UP CARE   The procedure you have had should have been relatively painless since the banding of the area involved does not have nerve endings and there is no pain sensation.  The rubber band cuts off the blood supply to the hemorrhoid and the band may fall off as soon as 48 hours after the banding (the band may occasionally be seen in the toilet bowl following a bowel movement). You may notice a temporary feeling of fullness in the rectum which should respond adequately to plain Tylenol or Motrin.  Following the banding, avoid strenuous exercise that evening and resume full activity the next day.  A sitz bath (soaking in a warm tub) or bidet is soothing, and can be useful for cleansing the area  after bowel movements.     To avoid constipation, take two tablespoons of natural wheat bran, natural oat bran, flax, Benefiber or any over the counter fiber supplement and increase your water intake to 7-8 glasses daily.    Unless you have been prescribed anorectal medication, do not put anything inside your rectum for two weeks: No suppositories, enemas, fingers, etc.  Occasionally, you may have more bleeding than usual after the banding procedure.  This is often from the untreated hemorrhoids rather than the treated one.  Don't be concerned if there is a tablespoon or so of blood.  If there is more blood than this, lie flat with your bottom higher than your head and apply an ice pack to the area. If the bleeding does not stop within a half an hour or if you feel faint, call our office at [phone] or go to the emergency room.  Problems are not common; however, if there is a substantial amount of bleeding, severe pain, chills, fever or difficulty passing urine (very rare) or other problems, you should call us at [phone] or report to the nearest emergency room.  Do not stay seated continuously for more than 2-3 hours for a day or two after the procedure.  Tighten your buttock muscles 10-15 times every two hours and take 10-15 deep breaths every 1-2 hours.  Do not spend more than a few minutes on the toilet if you cannot empty your bowel; instead re-visit the toilet at a later time.    It was a pleasure to see you today!  Vito Cirigliano, D.O.

## 2023-07-30 ENCOUNTER — Encounter: Payer: Self-pay | Admitting: Psychiatry

## 2023-07-31 NOTE — Progress Notes (Signed)
[name] [address] [place] [address] Phone: [phone] Subjective:   Margaret Deleon, am serving as a scribe for Dr. Antoine Primas.  I'm seeing this patient by the request  of:  Pincus Sanes, MD  CC: Low back pain follow-up, knee pain  [id number]  Margaret Deleon is a 42 y.o. female coming in with complaint of lumbar spine and knee pain. OMT 05/08/2021. Patient states that she is getting more headaches recently. Having pain in upper back and neck.   L knee pain in anterior aspect. Has sticking sensation with pain and popping. Able to workout despite pain.  Patient states that the pain is on the anterior aspect of the knee.  Makes a crepitus sound with pulling up her ankle.       Past Medical History:  Diagnosis Date   Anxiety 09/17/2017   Depression    Elevated blood pressure reading 10/14/2017   Elevated blood pressure   Hypertension    Migraine headache 09/17/2017   Has been on propranolol and labetalol in the past Did not tolerate Topamax Takes Excedrin Migraine as needed, has not tried has tried Fioricet once   Migraines    Muscle tightness 09/17/2017   Neck pain 09/17/2017   Nonallopathic lesion of cervical region 10/06/2017   Nonallopathic lesion of lumbosacral region 10/06/2017   Nonallopathic lesion of thoracic region 10/06/2017   Palpitations 09/17/2017   Palpitations   Past Surgical History:  Procedure Laterality Date   BREAST ENHANCEMENT SURGERY     CESAREAN SECTION     Social History   Socioeconomic History   Marital status: Significant Other    Spouse name: Not on file   Number of children: 3   Years of education: Not on file   Highest education level: Not on file  Occupational History   Occupation: sonographer  Tobacco Use   Smoking status: Never   Smokeless tobacco: Never  Vaping Use   Vaping status: Never Used  Substance and Sexual Activity   Alcohol use: Yes    Comment: occasionally    Drug use: No    Sexual activity: Yes  Other Topics Concern   Not on file  Social History Narrative   Not on file   Social Determinants of Health   Financial Resource Strain: Not on file  Food Insecurity: Not on file  Transportation Needs: Not on file  Physical Activity: Not on file  Stress: Not on file  Social Connections: Not on file   Allergies  Allergen Reactions   Codeine Nausea And Vomiting   Hydrochlorothiazide Rash   Prozac [Fluoxetine Hcl]     Increased anxiety   Topamax [Topiramate]     Felt weird, high like and tired   Family History  Problem Relation Age of Onset   Depression Mother    Cancer Father    Migraines Sister    Anxiety disorder Sister    Alcohol abuse Brother    Depression Brother    Esophageal cancer Neg Hx    Liver cancer Neg Hx    Pancreatic cancer Neg Hx    Rectal cancer Neg Hx    Breast cancer Neg Hx      Current Outpatient Medications (Cardiovascular):    amLODipine-olmesartan (AZOR) 5-20 MG tablet, TAKE 1 TABLET BY MOUTH DAILY     Current Outpatient Medications (Other):    buPROPion (WELLBUTRIN XL) 150 MG 24 hr tablet, TAKE 1 TABLET(150 MG) BY MOUTH DAILY   cyclobenzaprine (  FLEXERIL) 10 MG tablet, TAKE 1/2 TO 1 TABLET(5 TO 10 MG) BY MOUTH AT BEDTIME   hydrocortisone 2.5 % cream, Apply topically 2 (two) times daily as needed. APPLY EXTERNALLY TO THE AFFECTED AREA TWICE DAILY AS NEEDED   prochlorperazine (COMPAZINE) 10 MG tablet, Take 1 tablet (10 mg total) by mouth every 8 (eight) hours as needed for refractory nausea / vomiting (refractory headache).   sertraline (ZOLOFT) 50 MG tablet, Take 50 mg by mouth daily.   Reviewed prior external information including notes and imaging from  primary care provider As well as notes that were available from care everywhere and other healthcare systems.  Past medical history, social, surgical and family history all reviewed in electronic medical record.  No pertanent information unless stated regarding to the  chief complaint.   Review of Systems:  No  visual changes, nausea, vomiting, diarrhea, constipation, dizziness, abdominal pain, skin rash, fevers, chills, night sweats, weight loss, swollen lymph nodes, body aches, joint swelling, chest pain, shortness of breath, mood changes. POSITIVE muscle aches, headache  Objective  Blood pressure 128/84, pulse 88, height 5\' 7"  (1.702 m), weight 172 lb (78 kg), SpO2 98%.   General: No apparent distress alert and oriented x3 mood and affect normal, dressed appropriately.  HEENT: Pupils equal, extraocular movements intact  Respiratory: Patient's speak in full sentences and does not appear short of breath  Cardiovascular: No lower extremity edema, non tender, no erythema  Patient's left knee does have some crepitus with dorsi flexion that seems to be in the anterior tibialis. Low back does have some loss lordosis noted.  Some tenderness to palpation in the paraspinal musculature.  Patient does have tightness with Pearlean Brownie right greater than left.  Tightness of the neck with sidebending bilaterally.  Tightness in the occipital region.  Osteopathic findings C2 flexed rotated and side bent right C4 flexed rotated and side bent left C6 flexed rotated and side bent left T3 extended rotated and side bent right inhaled third rib T9 extended rotated and side bent left L2 flexed rotated and side bent right Sacrum right on right   Limited muscular skeletal ultrasound was performed and interpreted by Antoine Primas, M   Limited ultrasound does show some dynamic views of the anterior tibialis tendon proximally seeming to have some increasing hypoechoic changes.  No cortical irregularity of the joint itself noted. Impression: Questionable anterior tibialis tendinitis   Impression and Recommendations:     The above documentation has been reviewed and is accurate and complete Judi Saa, DO     Decision today to treat with OMT was based on Physical  Exam  After verbal consent patient was treated with HVLA, ME, FPR techniques in cervical, thoracic, rib, lumbar and sacral areas, all areas are chronic   Patient tolerated the procedure well with improvement in symptoms  Patient given exercises, stretches and lifestyle modifications  See medications in patient instructions if given  Patient will follow up in 4-8 weeks

## 2023-08-04 ENCOUNTER — Ambulatory Visit (INDEPENDENT_AMBULATORY_CARE_PROVIDER_SITE_OTHER): Payer: 59 | Admitting: Family Medicine

## 2023-08-04 ENCOUNTER — Encounter: Payer: Self-pay | Admitting: Family Medicine

## 2023-08-04 ENCOUNTER — Ambulatory Visit (INDEPENDENT_AMBULATORY_CARE_PROVIDER_SITE_OTHER): Payer: 59

## 2023-08-04 VITALS — BP 128/84 | HR 88 | Ht 67.0 in | Wt 172.0 lb

## 2023-08-04 DIAGNOSIS — M25562 Pain in left knee: Secondary | ICD-10-CM | POA: Diagnosis not present

## 2023-08-04 DIAGNOSIS — M9901 Segmental and somatic dysfunction of cervical region: Secondary | ICD-10-CM

## 2023-08-04 DIAGNOSIS — M9903 Segmental and somatic dysfunction of lumbar region: Secondary | ICD-10-CM

## 2023-08-04 DIAGNOSIS — M9908 Segmental and somatic dysfunction of rib cage: Secondary | ICD-10-CM

## 2023-08-04 DIAGNOSIS — M79662 Pain in left lower leg: Secondary | ICD-10-CM

## 2023-08-04 DIAGNOSIS — M5416 Radiculopathy, lumbar region: Secondary | ICD-10-CM | POA: Diagnosis not present

## 2023-08-04 DIAGNOSIS — M9902 Segmental and somatic dysfunction of thoracic region: Secondary | ICD-10-CM

## 2023-08-04 DIAGNOSIS — M542 Cervicalgia: Secondary | ICD-10-CM

## 2023-08-04 DIAGNOSIS — M9904 Segmental and somatic dysfunction of sacral region: Secondary | ICD-10-CM

## 2023-08-04 NOTE — Assessment & Plan Note (Signed)
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  Will continue to monitor.  No change in medication at this moment.

## 2023-08-04 NOTE — Patient Instructions (Signed)
L knee and tib fib xray Heel lift in the toe of shoe Ice after activity See me again in 7-8 weeks

## 2023-08-04 NOTE — Assessment & Plan Note (Signed)
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  We discussed with patient about potential aid of a heel lift in the shoe.  Discussed which activities to do and which ones to avoid.  Increase activity slowly otherwise.  Follow-up with me again in 6 to 8 weeks

## 2023-08-04 NOTE — Assessment & Plan Note (Signed)
Has had radicular symptoms previously.  Has responded well though to osteopathic manipulation.  Has been sometime since we have seen patient.  Discussed again about core strengthening aspect.  Increase activity slowly.  Follow-up again in 6 to 8 weeks

## 2023-08-22 ENCOUNTER — Encounter: Payer: Self-pay | Admitting: Gastroenterology

## 2023-08-22 ENCOUNTER — Ambulatory Visit (INDEPENDENT_AMBULATORY_CARE_PROVIDER_SITE_OTHER): Payer: 59 | Admitting: Gastroenterology

## 2023-08-22 VITALS — BP 122/70 | HR 91 | Ht 67.0 in | Wt 171.0 lb

## 2023-08-22 DIAGNOSIS — K641 Second degree hemorrhoids: Secondary | ICD-10-CM

## 2023-08-22 DIAGNOSIS — K602 Anal fissure, unspecified: Secondary | ICD-10-CM

## 2023-08-22 MED ORDER — AMBULATORY NON FORMULARY MEDICATION: 0 refills | Status: DC

## 2023-08-22 NOTE — Patient Instructions (Addendum)
_______________________________________________________  If your blood pressure at your visit was 140/90 or greater, please contact your primary care physician to follow up on this.  If you are age 41 or younger, your body mass index should be between 19-25. Your There is no height or weight on file to calculate BMI. If this is out of the aformentioned range listed, please consider follow up with your Primary Care Provider.  ________________________________________________________  The [place] [facility] providers would like to encourage you to use [facility] to communicate with providers for non-urgent requests or questions.  Due to long hold times on the telephone, sending your provider a message by [facility] may be a faster and more efficient way to get a response.  Please allow 48 business hours for a response.  Please remember that this is for non-urgent requests.  _______________________________________________________  We have sent a prescription for nitroglycerin 0.125% gel to [facility]. You should apply a pea size amount to your rectum three times daily x 6-8 weeks.  [facility]'s information is below: Address: [address], [place], [place] [address]  [phone]  *Please DO NOT go directly from our office to pick up this medication! Give the pharmacy 1 day to process the prescription as this is compounded and takes time to make.   HEMORRHOID BANDING PROCEDURE    FOLLOW-UP CARE   The procedure you have had should have been relatively painless since the banding of the area involved does not have nerve endings and there is no pain sensation.  The rubber band cuts off the blood supply to the hemorrhoid and the band may fall off as soon as 48 hours after the banding (the band may occasionally be seen in the toilet bowl following a bowel movement). You may notice a temporary feeling of fullness in the rectum which should respond adequately to plain Tylenol or  Motrin.  Following the banding, avoid strenuous exercise that evening and resume full activity the next day.  A sitz bath (soaking in a warm tub) or bidet is soothing, and can be useful for cleansing the area after bowel movements.     To avoid constipation, take two tablespoons of natural wheat bran, natural oat bran, flax, Benefiber or any over the counter fiber supplement and increase your water intake to 7-8 glasses daily.    Unless you have been prescribed anorectal medication, do not put anything inside your rectum for two weeks: No suppositories, enemas, fingers, etc.  Occasionally, you may have more bleeding than usual after the banding procedure.  This is often from the untreated hemorrhoids rather than the treated one.  Don't be concerned if there is a tablespoon or so of blood.  If there is more blood than this, lie flat with your bottom higher than your head and apply an ice pack to the area. If the bleeding does not stop within a half an hour or if you feel faint, call our office at [phone] or go to the emergency room.  Problems are not common; however, if there is a substantial amount of bleeding, severe pain, chills, fever or difficulty passing urine (very rare) or other problems, you should call us at [phone] or report to the nearest emergency room.  Do not stay seated continuously for more than 2-3 hours for a day or two after the procedure.  Tighten your buttock muscles 10-15 times every two hours and take 10-15 deep breaths every 1-2 hours.  Do not spend more than  a few minutes on the toilet if you cannot empty your bowel; instead re-visit the toilet at a later time.   It was a pleasure to see you today!  Vito Cirigliano, D.O.

## 2023-08-22 NOTE — Progress Notes (Signed)
Chief Complaint:    Symptomatic Internal Hemorrhoids; Hemorrhoid Band Ligation  GI History: 42 y.o. female with a history of HTN, anxiety, depression, follows in the GI clinic for symptomatic hemorrhoids (pruritus ani, burning, scant BRBPR).   - 05/26/2023: Colonoscopy: Normal colon, small grade 2 internal hemorrhoids.  Repeat 10 years - 07/28/2023: Banding of LL hemorrhoid - 08/22/2023: Presents for hemorrhoid banding #2  HPI:     Patient is a 42 y.o. femalewith a history of symptomatic internal hemorrhoids presenting to the Gastroenterology Clinic for follow-up and ongoing treatment. The patient presents with symptomatic grade 2 hemorrhoids, unresponsive to maximal medical therapy, requesting rubber band ligation of symptomatic hemorrhoidal disease.  Did well with first hemorrhoid banding.  No change in medical or surgical history, medications, allergies, social history since last appointment with me.   Review of systems:     No chest pain, no SOB, no fevers, no urinary sx   Past Medical History:  Diagnosis Date   Anxiety 09/17/2017   Depression    Elevated blood pressure reading 10/14/2017   Elevated blood pressure   Hypertension    Migraine headache 09/17/2017   Has been on propranolol and labetalol in the past Did not tolerate Topamax Takes Excedrin Migraine as needed, has not tried has tried Fioricet once   Migraines    Muscle tightness 09/17/2017   Neck pain 09/17/2017   Nonallopathic lesion of cervical region 10/06/2017   Nonallopathic lesion of lumbosacral region 10/06/2017   Nonallopathic lesion of thoracic region 10/06/2017   Palpitations 09/17/2017   Palpitations    Patient's surgical history, family medical history, social history, medications and allergies were all reviewed in Epic    Current Outpatient Medications  Medication Sig Dispense Refill   amLODipine-olmesartan (AZOR) 5-20 MG tablet TAKE 1 TABLET BY MOUTH DAILY 30 tablet 3   buPROPion (WELLBUTRIN XL) 150 MG 24  hr tablet TAKE 1 TABLET(150 MG) BY MOUTH DAILY 90 tablet 1   cyclobenzaprine (FLEXERIL) 10 MG tablet TAKE 1/2 TO 1 TABLET(5 TO 10 MG) BY MOUTH AT BEDTIME 45 tablet 0   hydrocortisone 2.5 % cream Apply topically 2 (two) times daily as needed. APPLY EXTERNALLY TO THE AFFECTED AREA TWICE DAILY AS NEEDED 30 g 0   prochlorperazine (COMPAZINE) 10 MG tablet Take 1 tablet (10 mg total) by mouth every 8 (eight) hours as needed for refractory nausea / vomiting (refractory headache). 15 tablet 0   sertraline (ZOLOFT) 50 MG tablet Take 50 mg by mouth daily.     No current facility-administered medications for this visit.    Physical Exam:     BP 122/70   Pulse 91   Ht 5\' 7"  (1.702 m)   Wt 171 lb (77.6 kg)   BMI 26.78 kg/m   GENERAL:  Pleasant female in NAD PSYCH: : Cooperative, normal affect NEURO: Alert and oriented x 3, no focal neurologic deficits Rectal exam: Sensation intact and preserved anal wink.  Grade 1-2 hemorrhoids noted in RA and RP position on exam.  Very small external anal fissure on right.  Normal sphincter tone. No palpable mass. No blood on the exam glove. (Chaperone: Thompson Grayer, CMA).   IMPRESSION and PLAN:    #1.  Symptomatic internal hemorrhoids: PROCEDURE NOTE: The patient presents with symptomatic grade 2 hemorrhoids, unresponsive to maximal medical therapy, requesting rubber band ligation of symptomatic hemorrhoidal disease.  All risks, benefits and alternative forms of therapy were described and informed consent was obtained.  In the Left Lateral Decubitus position,  anoscopic examination revealed grade 1-2 hemorrhoids in the RA and RP position(s).  The anorectum was pre-medicated with [facility]. The decision was made to band the RA internal hemorrhoid, and the [facility] System was used to perform band ligation without complication.  Digital anorectal examination was then performed to assure proper positioning of the band, and to adjust the banded tissue as  required.  The patient was discharged home without pain or other issues.  Dietary and behavioral recommendations were given and along with follow-up instructions.     The following adjunctive treatments were recommended:  -Resume high-fiber diet with fiber supplement (i.e. Citrucel or Benefiber) with goal for soft stools without straining to have a BM. -Resume adequate fluid intake.  The patient will return in 4 weeks for follow-up and possible additional banding as required. No complications were encountered and the patient tolerated the procedure well.   #2.  External anal fissure Exam with small, external anal fissure. - Topical NTG 0.125% applied twice daily x 6 weeks      Shellia Cleverly ,DO, FACG 08/22/2023, 11:39 AM

## 2023-09-05 NOTE — Progress Notes (Unsigned)
[name] [address] [place] [address] Phone: [phone] Subjective:   INadine Deleon, am serving as a scribe for Dr. Antoine Primas.  I'm seeing this patient by the request  of:  Pincus Sanes, MD  CC: back and neck pain  [id number]  Margaret Deleon is a 42 y.o. female coming in with complaint of back and neck pain. OMT on 08/04/2023. Patient states same per usual. No new concerns.  Medications patient has been prescribed:   Taking:         Reviewed prior external information including notes and imaging from previsou exam, outside providers and external EMR if available.   As well as notes that were available from care everywhere and other healthcare systems.  Past medical history, social, surgical and family history all reviewed in electronic medical record.  No pertanent information unless stated regarding to the chief complaint.   Past Medical History:  Diagnosis Date   Anxiety 09/17/2017   Depression    Elevated blood pressure reading 10/14/2017   Elevated blood pressure   Hypertension    Migraine headache 09/17/2017   Has been on propranolol and labetalol in the past Did not tolerate Topamax Takes Excedrin Migraine as needed, has not tried has tried Fioricet once   Migraines    Muscle tightness 09/17/2017   Neck pain 09/17/2017   Nonallopathic lesion of cervical region 10/06/2017   Nonallopathic lesion of lumbosacral region 10/06/2017   Nonallopathic lesion of thoracic region 10/06/2017   Palpitations 09/17/2017   Palpitations    Allergies  Allergen Reactions   Codeine Nausea And Vomiting   Hydrochlorothiazide Rash   Prozac [Fluoxetine Hcl]     Increased anxiety   Topamax [Topiramate]     Felt weird, high like and tired     Review of Systems:  No headache, visual changes, nausea, vomiting, diarrhea, constipation, dizziness, abdominal pain, skin rash, fevers, chills, night sweats, weight loss, swollen lymph nodes,  body aches, joint swelling, chest pain, shortness of breath, mood changes. POSITIVE muscle aches, headaches but improving  Objective  Blood pressure 122/84, pulse 76, height 5\' 7"  (1.702 m), weight 174 lb (78.9 kg), SpO2 97%.   General: No apparent distress alert and oriented x3 mood and affect normal, dressed appropriately.  HEENT: Pupils equal, extraocular movements intact  Respiratory: Patient's speak in full sentences and does not appear short of breath  Cardiovascular: No lower extremity edema, non tender, no erythema  MSK:  Back does have significant tightness noted.  Tightness noted in the neck noted,  Osteopathic findings  C2 flexed rotated and side bent right C5 flexed rotated and side bent left T3 extended rotated and side bent right inhaled rib T7 extended rotated and side bent left L2 flexed rotated and side bent right Sacrum right on right     Assessment and Plan:  Lumbar radiculopathy, right Tightness noted in the lower back.  Possible radiculopathy also to the knee.  We will continue to monitor this.  Patient has had difficulty with the anterior knee we will need to see how patient does in the long run.  Discussed with patient about icing regimen and home exercises, which activities to do and which ones to avoid.  Increase activity slowly otherwise.  Follow-up with me again in 6 to 8 weeks.  Anterior knee pain, left Discussed with patient did have the anterior tibialis tendinitis.  Seem to be more knee and ankle.  At this point if worsening symptoms  would consider injection in the knee to see if anything is follow-up with me again 6 to 8 weeks    Nonallopathic problems  Decision today to treat with OMT was based on Physical Exam  After verbal consent patient was treated with HVLA, ME, FPR techniques in cervical, rib, thoracic, lumbar, and sacral  areas  Patient tolerated the procedure well with improvement in symptoms  Patient given exercises, stretches and  lifestyle modifications  See medications in patient instructions if given  Patient will follow up in 4-8 weeks    The above documentation has been reviewed and is accurate and complete Judi Saa, DO          Note: This dictation was prepared with Dragon dictation along with smaller phrase technology. Any transcriptional errors that result from this process are unintentional.

## 2023-09-08 ENCOUNTER — Ambulatory Visit: Payer: 59 | Admitting: Family Medicine

## 2023-09-08 ENCOUNTER — Encounter: Payer: Self-pay | Admitting: Family Medicine

## 2023-09-08 VITALS — BP 122/84 | HR 76 | Ht 67.0 in | Wt 174.0 lb

## 2023-09-08 DIAGNOSIS — M9904 Segmental and somatic dysfunction of sacral region: Secondary | ICD-10-CM

## 2023-09-08 DIAGNOSIS — M9908 Segmental and somatic dysfunction of rib cage: Secondary | ICD-10-CM | POA: Diagnosis not present

## 2023-09-08 DIAGNOSIS — M9903 Segmental and somatic dysfunction of lumbar region: Secondary | ICD-10-CM | POA: Diagnosis not present

## 2023-09-08 DIAGNOSIS — M9902 Segmental and somatic dysfunction of thoracic region: Secondary | ICD-10-CM | POA: Diagnosis not present

## 2023-09-08 DIAGNOSIS — M9901 Segmental and somatic dysfunction of cervical region: Secondary | ICD-10-CM

## 2023-09-08 DIAGNOSIS — M25562 Pain in left knee: Secondary | ICD-10-CM | POA: Diagnosis not present

## 2023-09-08 DIAGNOSIS — M5416 Radiculopathy, lumbar region: Secondary | ICD-10-CM

## 2023-09-08 NOTE — Assessment & Plan Note (Signed)
Tightness noted in the lower back.  Possible radiculopathy also to the knee.  We will continue to monitor this.  Patient has had difficulty with the anterior knee we will need to see how patient does in the long run.  Discussed with patient about icing regimen and home exercises, which activities to do and which ones to avoid.  Increase activity slowly otherwise.  Follow-up with me again in 6 to 8 weeks.

## 2023-09-08 NOTE — Assessment & Plan Note (Signed)
Discussed with patient did have the anterior tibialis tendinitis.  Seem to be more knee and ankle.  At this point if worsening symptoms would consider injection in the knee to see if anything is follow-up with me again 6 to 8 weeks

## 2023-09-24 ENCOUNTER — Encounter: Payer: 59 | Admitting: Gastroenterology

## 2023-10-04 ENCOUNTER — Other Ambulatory Visit: Payer: Self-pay | Admitting: Internal Medicine

## 2023-10-21 ENCOUNTER — Encounter: Payer: 59 | Admitting: Gastroenterology

## 2023-10-24 NOTE — Progress Notes (Signed)
 Margaret Deleon [address] [place] [address] Phone: [phone] Subjective:   Margaret Deleon, am serving as a scribe for Dr. Ronnell Coins.  I'm seeing this patient by the request  of:  Margaret Dauphin, MD  CC: back and neck pain follow up   [id number]  Margaret Deleon is a 43 y.o. female coming in with complaint of back and neck pain. OMT 09/08/2023. Patient states neck and back pain feeling a little better.  Patient has been more active recently.  Medications patient has been prescribed: None          Reviewed prior external information including notes and imaging from previsou exam, outside providers and external EMR if available.   As well as notes that were available from care everywhere and other healthcare systems.  Past medical history, social, surgical and family history all reviewed in electronic medical record.  No pertanent information unless stated regarding to the chief complaint.   Past Medical History:  Diagnosis Date   Anxiety 09/17/2017   Depression    Elevated blood pressure reading 10/14/2017   Elevated blood pressure   Hypertension    Migraine headache 09/17/2017   Has been on propranolol  and labetalol in the past Did not tolerate Topamax  Takes Excedrin Migraine as needed, has not tried has tried Fioricet once   Migraines    Muscle tightness 09/17/2017   Neck pain 09/17/2017   Nonallopathic lesion of cervical region 10/06/2017   Nonallopathic lesion of lumbosacral region 10/06/2017   Nonallopathic lesion of thoracic region 10/06/2017   Palpitations 09/17/2017   Palpitations    Allergies  Allergen Reactions   Codeine Nausea And Vomiting   Hydrochlorothiazide  Rash   Prozac  [Fluoxetine  Hcl]     Increased anxiety   Topamax  [Topiramate ]     Felt weird, high like and tired     Review of Systems:  No headache, visual changes, nausea, vomiting, diarrhea, constipation, dizziness, abdominal pain, skin rash, fevers,  chills, night sweats, weight loss, swollen lymph nodes, body aches, joint swelling, chest pain, shortness of breath, mood changes. POSITIVE muscle aches  Objective  Blood pressure 112/82, pulse (!) 55, height 5\' 7"  (1.702 m), weight 177 lb (80.3 kg), SpO2 97%.   General: No apparent distress alert and oriented x3 mood and affect normal, dressed appropriately.  HEENT: Pupils equal, extraocular movements intact  Respiratory: Patient's speak in full sentences and does not appear short of breath  Cardiovascular: No lower extremity edema, non tender, no erythema  Gait relatively unremarkable. MSK:  Back does have some loss of lordosis noted.  Some tenderness to palpation in the paraspinal musculature.  Patient does have increasing in strength noted today.  Seems to be the upper back compared to previous exam but does have Somewhat tightness.  Osteopathic findings  C2 flexed rotated and side bent right C6 flexed rotated and side bent left T3 extended rotated and side bent right inhaled rib T9 extended rotated and side bent left L2 flexed rotated and side bent right Sacrum right on right    Assessment and Plan:  Lumbar radiculopathy, right Patient has been doing remarkably well overall.  Patient has gained more muscle which I do think has been very very beneficial.  Discussed icing regimen and home exercises, which activities to do and which ones to avoid.  Increase activity slowly otherwise.  Follow-up again in 6 to 8 weeks.    Nonallopathic problems  Decision today to treat with OMT was based  on Physical Exam  After verbal consent patient was treated with HVLA, ME, FPR techniques in cervical, rib, thoracic, lumbar, and sacral  areas  Patient tolerated the procedure well with improvement in symptoms  Patient given exercises, stretches and lifestyle modifications  See medications in patient instructions if given  Patient will follow up in 4-8 weeks    The above documentation has  been reviewed and is accurate and complete Margaret Margo, DO          Note: This dictation was prepared with Dragon dictation along with smaller phrase technology. Any transcriptional errors that result from this process are unintentional.

## 2023-10-27 ENCOUNTER — Ambulatory Visit: Payer: 59 | Admitting: Family Medicine

## 2023-10-27 ENCOUNTER — Encounter: Payer: Self-pay | Admitting: Family Medicine

## 2023-10-27 VITALS — BP 112/82 | HR 55 | Ht 67.0 in | Wt 177.0 lb

## 2023-10-27 DIAGNOSIS — M9902 Segmental and somatic dysfunction of thoracic region: Secondary | ICD-10-CM | POA: Diagnosis not present

## 2023-10-27 DIAGNOSIS — M9903 Segmental and somatic dysfunction of lumbar region: Secondary | ICD-10-CM | POA: Diagnosis not present

## 2023-10-27 DIAGNOSIS — M5416 Radiculopathy, lumbar region: Secondary | ICD-10-CM

## 2023-10-27 DIAGNOSIS — M9904 Segmental and somatic dysfunction of sacral region: Secondary | ICD-10-CM | POA: Diagnosis not present

## 2023-10-27 DIAGNOSIS — M9908 Segmental and somatic dysfunction of rib cage: Secondary | ICD-10-CM | POA: Diagnosis not present

## 2023-10-27 DIAGNOSIS — M9901 Segmental and somatic dysfunction of cervical region: Secondary | ICD-10-CM

## 2023-10-27 NOTE — Assessment & Plan Note (Signed)
 Patient has been doing remarkably well overall.  Patient has gained more muscle which I do think has been very very beneficial.  Discussed icing regimen and home exercises, which activities to do and which ones to avoid.  Increase activity slowly otherwise.  Follow-up again in 6 to 8 weeks.

## 2023-10-27 NOTE — Patient Instructions (Signed)
 Great to see you! Love the extra protein. When you run reduce the weight lighting a bit.  Grastin tool Return in 2 months.

## 2023-10-30 ENCOUNTER — Encounter: Payer: Self-pay | Admitting: Family Medicine

## 2023-11-13 ENCOUNTER — Other Ambulatory Visit: Payer: Self-pay | Admitting: Internal Medicine

## 2023-11-18 ENCOUNTER — Other Ambulatory Visit: Payer: Self-pay | Admitting: Internal Medicine

## 2023-12-01 ENCOUNTER — Ambulatory Visit (INDEPENDENT_AMBULATORY_CARE_PROVIDER_SITE_OTHER): Payer: 59 | Admitting: Gastroenterology

## 2023-12-01 ENCOUNTER — Encounter: Payer: Self-pay | Admitting: Gastroenterology

## 2023-12-01 VITALS — BP 130/80 | HR 86 | Ht 67.0 in | Wt 172.0 lb

## 2023-12-01 DIAGNOSIS — K641 Second degree hemorrhoids: Secondary | ICD-10-CM

## 2023-12-01 NOTE — Patient Instructions (Signed)
 _______________________________________________________  If your blood pressure at your visit was 140/90 or greater, please contact your primary care physician to follow up on this.  _______________________________________________________  If you are age 42 or older, your body mass index should be between 23-30. Your Body mass index is 26.94 kg/m. If this is out of the aforementioned range listed, please consider follow up with your Primary Care Provider.  If you are age 12 or younger, your body mass index should be between 19-25. Your Body mass index is 26.94 kg/m. If this is out of the aformentioned range listed, please consider follow up with your Primary Care Provider.   ________________________________________________________  The [facility] providers would like to encourage you to use [facility] to communicate with providers for non-urgent requests or questions.  Due to long hold times on the telephone, sending your provider a message by [facility] may be a faster and more efficient way to get a response.  Please allow 48 business hours for a response.  Please remember that this is for non-urgent requests.  _______________________________________________________  [place] [facility] PROCEDURE    FOLLOW-UP CARE   The procedure you have had should have been relatively painless since the banding of the area involved does not have nerve endings and there is no pain sensation.  The rubber band cuts off the blood supply to the hemorrhoid and the band may fall off as soon as 48 hours after the banding (the band may occasionally be seen in the toilet bowl following a bowel movement). You may notice a temporary feeling of fullness in the rectum which should respond adequately to plain Tylenol or Motrin.  Following the banding, avoid strenuous exercise that evening and resume full activity the next day.  A sitz bath (soaking in a warm tub) or bidet is soothing, and can be useful for cleansing the area  after bowel movements.     To avoid constipation, take two tablespoons of natural wheat bran, natural oat bran, flax, Benefiber or any over the counter fiber supplement and increase your water intake to 7-8 glasses daily.    Unless you have been prescribed anorectal medication, do not put anything inside your rectum for two weeks: No suppositories, enemas, fingers, etc.  Occasionally, you may have more bleeding than usual after the banding procedure.  This is often from the untreated hemorrhoids rather than the treated one.  Don't be concerned if there is a tablespoon or so of blood.  If there is more blood than this, lie flat with your bottom higher than your head and apply an ice pack to the area. If the bleeding does not stop within a half an hour or if you feel faint, call our office at [phone] or go to the emergency room.  Problems are not common; however, if there is a substantial amount of bleeding, severe pain, chills, fever or difficulty passing urine (very rare) or other problems, you should call us at [phone] or report to the nearest emergency room.  Do not stay seated continuously for more than 2-3 hours for a day or two after the procedure.  Tighten your buttock muscles 10-15 times every two hours and take 10-15 deep breaths every 1-2 hours.  Do not spend more than a few minutes on the toilet if you cannot empty your bowel; instead re-visit the toilet at a later time.  It was a pleasure to see you today!  Vito Cirigliano, D.O.

## 2023-12-01 NOTE — Progress Notes (Signed)
 Chief Complaint:    Symptomatic Internal Hemorrhoids; Hemorrhoid Band Ligation  GI History: 43 y.o. female with a history of HTN, anxiety, depression, follows in the GI clinic for symptomatic hemorrhoids (pruritus ani, burning, scant BRBPR).   - 05/26/2023: Colonoscopy: Normal colon, small grade 2 internal hemorrhoids.  Repeat 10 years - 07/28/2023: Banding of LL hemorrhoid - 08/22/2023: Banding of RA hemorrhoid  HPI:     Patient is a 43 y.o. femalewith a history of symptomatic internal hemorrhoids presenting to the Gastroenterology Clinic for follow-up and ongoing treatment. The patient presents with symptomatic grade 2 hemorrhoids, unresponsive to maximal medical therapy, requesting rubber band ligation of symptomatic hemorrhoidal disease.  Has done well with banding so far. Noted anal fissure at last appt ; treated with conservative measures with good response.   No change in medical or surgical history, medications, allergies, social history since last appointment with me.   Review of systems:     No chest pain, no SOB, no fevers, no urinary sx   Past Medical History:  Diagnosis Date   Anxiety 09/17/2017   Depression    Elevated blood pressure reading 10/14/2017   Elevated blood pressure   Hypertension    Migraine headache 09/17/2017   Has been on propranolol and labetalol in the past Did not tolerate Topamax Takes Excedrin Migraine as needed, has not tried has tried Fioricet once   Migraines    Muscle tightness 09/17/2017   Neck pain 09/17/2017   Nonallopathic lesion of cervical region 10/06/2017   Nonallopathic lesion of lumbosacral region 10/06/2017   Nonallopathic lesion of thoracic region 10/06/2017   Palpitations 09/17/2017   Palpitations    Patient's surgical history, family medical history, social history, medications and allergies were all reviewed in Epic    Current Outpatient Medications  Medication Sig Dispense Refill   AMBULATORY NON FORMULARY MEDICATION Nitroglycerine  ointment 0.125 %  Apply a pea sized amount internally three times daily for 6 to 8 weeks Dispense 30 GM zero refill 30 g 0   amLODipine-olmesartan (AZOR) 5-20 MG tablet TAKE 1 TABLET BY MOUTH DAILY. NEEDS OFFICE VISIT FOR REFILLS. 30 tablet 0   buPROPion (WELLBUTRIN XL) 150 MG 24 hr tablet TAKE 1 TABLET(150 MG) BY MOUTH DAILY. Needs appointment for further refills. 30 tablet 0   cyclobenzaprine (FLEXERIL) 10 MG tablet TAKE 1/2 TO 1 TABLET(5 TO 10 MG) BY MOUTH AT BEDTIME 45 tablet 0   hydrocortisone 2.5 % cream Apply topically 2 (two) times daily as needed. APPLY EXTERNALLY TO THE AFFECTED AREA TWICE DAILY AS NEEDED 30 g 0   prochlorperazine (COMPAZINE) 10 MG tablet Take 1 tablet (10 mg total) by mouth every 8 (eight) hours as needed for refractory nausea / vomiting (refractory headache). 15 tablet 0   sertraline (ZOLOFT) 50 MG tablet Take 50 mg by mouth daily.     No current facility-administered medications for this visit.    Physical Exam:     BP 130/80   Pulse 86   Ht 5\' 7"  (1.702 m)   Wt 172 lb (78 kg)   BMI 26.94 kg/m   GENERAL:  Pleasant female in NAD PSYCH: : Cooperative, normal affect NEURO: Alert and oriented x 3, no focal neurologic deficits Rectal exam: Sensation intact and preserved anal wink.  Small grade 1 hemorrhoid noted in RP position on exam.  Small external skin tag.  external anal fissures noted. Normal sphincter tone. No palpable mass. No blood on the exam glove. (Chaperone: Loysie, CMA).  IMPRESSION and PLAN:    #1.  Symptomatic internal hemorrhoids: PROCEDURE NOTE: The patient presents with symptomatic grade 2 hemorrhoids, unresponsive to maximal medical therapy, requesting rubber band ligation of symptomatic hemorrhoidal disease.  All risks, benefits and alternative forms of therapy were described and informed consent was obtained.  In the Left Lateral Decubitus position, anoscopic examination revealed grade 1 hemorrhoids in the RP position(s).  The  anorectum was pre-medicated with RectiCare. The decision was made to band the RP internal hemorrhoid, and the [facility] System was used to perform band ligation without complication.  Digital anorectal examination was then performed to assure proper positioning of the band, and to adjust the banded tissue as required.  The patient was discharged home without pain or other issues.  Dietary and behavioral recommendations were given and along with follow-up instructions.     The following adjunctive treatments were recommended:  -Resume high-fiber diet with fiber supplement (i.e. Citrucel or Benefiber) with goal for soft stools without straining to have a BM. -Resume adequate fluid intake.  The patient will return as needed if return of hemorrhoidal symptoms for follow-up and possible additional banding as required. No complications were encountered and the patient tolerated the procedure well.    #2.  External anal fissure Small external anal fissure noted on previous exam.  Patient was never contacted regarding topical NTG.  Symptoms otherwise resolved with conservative management.  To call me if symptoms recur.      Shellia Cleverly ,DO, FACG 12/01/2023, 11:47 AM

## 2023-12-15 ENCOUNTER — Other Ambulatory Visit: Payer: Self-pay | Admitting: Internal Medicine

## 2023-12-17 ENCOUNTER — Encounter: Payer: Self-pay | Admitting: Gastroenterology

## 2023-12-18 ENCOUNTER — Ambulatory Visit (INDEPENDENT_AMBULATORY_CARE_PROVIDER_SITE_OTHER): Admitting: Gastroenterology

## 2023-12-18 ENCOUNTER — Encounter: Payer: Self-pay | Admitting: Gastroenterology

## 2023-12-18 VITALS — BP 140/68 | HR 94 | Ht 67.0 in | Wt 168.0 lb

## 2023-12-18 DIAGNOSIS — K644 Residual hemorrhoidal skin tags: Secondary | ICD-10-CM | POA: Diagnosis not present

## 2023-12-18 DIAGNOSIS — K602 Anal fissure, unspecified: Secondary | ICD-10-CM | POA: Diagnosis not present

## 2023-12-18 DIAGNOSIS — K648 Other hemorrhoids: Secondary | ICD-10-CM

## 2023-12-18 MED ORDER — AMBULATORY NON FORMULARY MEDICATION: 0 refills | Status: AC

## 2023-12-18 NOTE — Patient Instructions (Addendum)
 _______________________________________________________  If your blood pressure at your visit was 140/90 or greater, please contact your primary care physician to follow up on this.  If you are age 43 or younger, your body mass index should be between 19-25. Your Body mass index is 26.31 kg/m. If this is out of the aformentioned range listed, please consider follow up with your Primary Care Provider.  ________________________________________________________  We have sent a prescription for nitroglycerin 0.125% gel to [facility]. You should apply a pea size amount to your rectum two times daily x 6-8 weeks.  [facility]'s information is below: Address: [address] [place], [place] [address]  [phone]  *Please DO NOT go directly from our office to pick up this medication! Give the pharmacy 1 day to process the prescription as this is compounded and takes time to make.  How to Take a [employer] A sitz bath is a warm water bath that may be used to care for your rectum, genital area, or the area between your rectum and genitals (perineum). In a sitz bath, the water only comes up to your hips and covers your buttocks. A sitz bath may be done in a bathtub or with a portable sitz bath that fits over the toilet. Your health care provider may recommend a sitz bath to help: Relieve pain and discomfort after delivering a baby. Relieve pain and itching from hemorrhoids or anal fissures. Relieve pain after certain surgeries. Relax muscles that are sore or tight. How to take a sitz bath Take 2-4 sitz baths a day, or as many as told by your health care provider. Bathtub sitz bath To take a sitz bath in a bathtub: Partially fill a bathtub with warm water. The water should be deep enough to cover your hips and buttocks when you are sitting in the bathtub. Follow your health care provider's instructions if you are told to put medicine in the water. Sit in the water. Open the  bathtub drain a little, and leave it open during your bath. Turn on the warm water again, enough to replace the water that is draining out. Keep the water running throughout your bath. This helps keep the water at the right level and temperature. Soak in the water for 15-20 minutes, or as long as told by your health care provider. When you are done, be careful when you stand up. You may feel dizzy. After the sitz bath, pat yourself dry. Do not rub your skin to dry it.  Over-the-toilet sitz bath To take a sitz bath with an over-the-toilet basin: Follow the manufacturer's instructions. Fill the basin with warm water. Follow your health care provider's instructions if you were told to put medicine in the water. Sit on the seat. Make sure the water covers your buttocks and perineum. Soak in the water for 15-20 minutes, or as long as told by your health care provider. After the sitz bath, pat yourself dry. Do not rub your skin to dry it. Clean and dry the basin between uses. Discard the basin if it cracks, or according to the manufacturer's instructions.  Contact a health care provider if: Your pain or itching gets worse. Stop doing sitz baths if your symptoms get worse. You have new symptoms. Stop doing sitz baths until you talk with your health care provider. Summary A sitz bath is a warm water bath in which the water only comes up to your hips and covers your buttocks. Your health care provider may recommend  a sitz bath to help relieve pain and discomfort after delivering a baby, relieve pain and itching from hemorrhoids or anal fissures, relieve pain after certain surgeries, or help to relax muscles that are sore or tight. Take 2-4 sitz baths a day, or as many as told by your health care provider. Soak in the water for 15-20 minutes. Stop doing sitz baths if your symptoms get worse. This information is not intended to replace advice given to you by your health care provider. Make sure you  discuss any questions you have with your health care provider. Document Revised: 12/04/2021 Document Reviewed: 12/04/2021 Elsevier Patient Education  2024 Elsevier Inc.   The [facility] providers would like to encourage you to use [facility] to communicate with providers for non-urgent requests or questions.  Due to long hold times on the telephone, sending your provider a message by [facility] may be a faster and more efficient way to get a response.  Please allow 48 business hours for a response.  Please remember that this is for non-urgent requests.  _______________________________________________________  Please purchase the following medications over the counter and take as directed: START: Recticare Complete as directed  START: Miralax use 1/2 capful daily  You have been scheduled for an appointment with __________ at [facility]. Your appointment is on ______ at ______. Please arrive at ________ for registration. Make certain to bring a list of current medications, including any over the counter medications or vitamins. Also bring your co-pay if you have one as well as your insurance cards. [facility] is located at [address], [address]. Should you need to reschedule your appointment, please contact them at [phone].  It was a pleasure to see you today!  Vito Cirigliano, D.O.

## 2023-12-18 NOTE — Progress Notes (Signed)
 Chief Complaint:    Rectal pain  GI History: 43 y.o. female with a history of HTN, anxiety, depression, follows in the GI clinic for symptomatic hemorrhoids (pruritus ani, burning, scant BRBPR).   - 05/26/2023: Colonoscopy: Normal colon, small grade 2 internal hemorrhoids.  Repeat 10 years - 07/28/2023: Banding of LL hemorrhoid - 08/22/2023: Banding of RA hemorrhoid.  Exam also notable for external anal fissure.  Prescribed topical NTG, but she never started and symptoms improved with conservative management alone - 12/01/2023: Banding of RP hemorrhoid   HPI:     Patient is a 43 y.o. female presenting to the Gastroenterology Clinic for evaluation of rectal pain.  She had completed hemorrhoid banding series just a few weeks ago as outlined above.  Did well with hemorrhoid banding without any issues.  Symptoms started about 5 days after her most recent hemorrhoid banding.  Symptoms initially tolerable, but started worsening about 1 week ago.  Now with constant throbbing pain and feeling of swelling with BRBPR with BM.  Dyschezia+.  She has not had any hard stools or straining to have BM.     Review of systems:     No chest pain, no SOB, no fevers, no urinary sx   Past Medical History:  Diagnosis Date   Anxiety 09/17/2017   Depression    Elevated blood pressure reading 10/14/2017   Elevated blood pressure   Hypertension    Migraine headache 09/17/2017   Has been on propranolol and labetalol in the past Did not tolerate Topamax Takes Excedrin Migraine as needed, has not tried has tried Fioricet once   Migraines    Muscle tightness 09/17/2017   Neck pain 09/17/2017   Nonallopathic lesion of cervical region 10/06/2017   Nonallopathic lesion of lumbosacral region 10/06/2017   Nonallopathic lesion of thoracic region 10/06/2017   Palpitations 09/17/2017   Palpitations    Patient's surgical history, family medical history, social history, medications and allergies were all reviewed in Epic     Current Outpatient Medications  Medication Sig Dispense Refill   AMBULATORY NON FORMULARY MEDICATION Nitroglycerine ointment 0.125 %  Apply a pea sized amount internally three times daily for 6 to 8 weeks Dispense 30 GM zero refill 30 g 0   amLODipine-olmesartan (AZOR) 5-20 MG tablet TAKE 1 TABLET BY MOUTH DAILY 30 tablet 0   buPROPion (WELLBUTRIN XL) 150 MG 24 hr tablet TAKE 1 TABLET(150 MG) BY MOUTH DAILY 30 tablet 0   cyclobenzaprine (FLEXERIL) 10 MG tablet TAKE 1/2 TO 1 TABLET(5 TO 10 MG) BY MOUTH AT BEDTIME 45 tablet 0   hydrocortisone 2.5 % cream Apply topically 2 (two) times daily as needed. APPLY EXTERNALLY TO THE AFFECTED AREA TWICE DAILY AS NEEDED 30 g 0   prochlorperazine (COMPAZINE) 10 MG tablet Take 1 tablet (10 mg total) by mouth every 8 (eight) hours as needed for refractory nausea / vomiting (refractory headache). 15 tablet 0   sertraline (ZOLOFT) 50 MG tablet Take 50 mg by mouth daily.     No current facility-administered medications for this visit.    Physical Exam:     There were no vitals taken for this visit.  GENERAL:  Pleasant female in NAD PSYCH: : Cooperative, normal affect Musculoskeletal:  Normal muscle tone, normal strength NEURO: Alert and oriented x 3, no focal neurologic deficits Rectal exam: Small external skin tags.  Internal anal fissure with exquisite TTP located at posterior portion of anal verge (9 o'clock position while in left lateral) (Chaperone: Thompson Grayer,  CMA).    IMPRESSION and PLAN:    1) Anal Fissure: Physical exam notable for external anal fissure in the posterior position. Will treat as below:  - Start topical NTG 0.125%, apply a small, pea-sized amount to the affected area BID for 6-8 weeks.  - We discussed the ADR of headache, and if patient does experience, to call me and will change and make pharmacy request to compound topical CCB (topical nifedipine 0.2-0.3% applied 2-4 times daily; unfortunately, the CCB also carries an  ADR of headache in 5-12%). Additionally, cautioned to avoid strenuous activity within 30 minutes of application  - OTC RectiCare complete applied to affected area - Start MiraLAX 1/2 cap daily to maintain soft stools and aid in fissure healing.  Decrease dosing if watery or loose stools or frequent BM - Sitz bath with warm water for 10-15 minutes 2-3 times daily; directed to [employer] available online or at [employer]; ensure to dry area afterwards - If no improvement with appropriate trial of therapy, we briefly discussed referral to Colorectal Surgery  2) External skin tags - Patient requested referral to Colorectal Surgery for evaluation and potential removal  3) Internal hemorrhoids Has completed hemorrhoid banding series.             Verlin Dike Wendie Diskin ,DO, FACG 12/18/2023, 1:29 PM

## 2023-12-19 NOTE — Progress Notes (Signed)
 [name] [address] [place] [address] Phone: [phone] Subjective:    I'm seeing this patient by the request  of:  Pincus Sanes, MD  CC: Back and neck pain follow-up  [id number]  Margaret Deleon is a 43 y.o. female coming in with complaint of back and neck pain. OMT 10/27/2023. Patient states it is about the same as usual.  Some discomfort but nothing that is stopping her from activity at this moment.  Medications patient has been prescribed: None  Taking:         Reviewed prior external information including notes and imaging from previsou exam, outside providers and external EMR if available.   As well as notes that were available from care everywhere and other healthcare systems.  Past medical history, social, surgical and family history all reviewed in electronic medical record.  No pertanent information unless stated regarding to the chief complaint.   Past Medical History:  Diagnosis Date   Anxiety 09/17/2017   Depression    Elevated blood pressure reading 10/14/2017   Elevated blood pressure   Hypertension    Migraine headache 09/17/2017   Has been on propranolol and labetalol in the past Did not tolerate Topamax Takes Excedrin Migraine as needed, has not tried has tried Fioricet once   Migraines    Muscle tightness 09/17/2017   Neck pain 09/17/2017   Nonallopathic lesion of cervical region 10/06/2017   Nonallopathic lesion of lumbosacral region 10/06/2017   Nonallopathic lesion of thoracic region 10/06/2017   Palpitations 09/17/2017   Palpitations    Allergies  Allergen Reactions   Codeine Nausea And Vomiting   Hydrochlorothiazide Rash   Prozac [Fluoxetine Hcl]     Increased anxiety   Topamax [Topiramate]     Felt weird, high like and tired     Review of Systems:  No headache, visual changes, nausea, vomiting, diarrhea, constipation, dizziness, abdominal pain, skin rash, fevers, chills, night sweats, weight loss,  swollen lymph nodes, body aches, joint swelling, chest pain, shortness of breath, mood changes. POSITIVE muscle aches  Objective  Blood pressure 120/70, pulse 69, height 5\' 7"  (1.702 m), weight 167 lb (75.8 kg), SpO2 100%.   General: No apparent distress alert and oriented x3 mood and affect normal, dressed appropriately.  HEENT: Pupils equal, extraocular movements intact  Respiratory: Patient's speak in full sentences and does not appear short of breath  Cardiovascular: No lower extremity edema, non tender, no erythema  Gait relatively normal MSK:  Back does have some loss lordosis noted.  Some tenderness to palpation in the paraspinal musculature.  Tightness with Pearlean Brownie right greater than left.  Osteopathic findings  C2 flexed rotated and side bent right C7 flexed rotated and side bent right T3 extended rotated and side bent right inhaled rib T9 extended rotated and side bent left L2 flexed rotated and side bent right L3 flexed rotated and side bent left Sacrum right on right       Assessment and Plan:  Lumbar radiculopathy, right Patient has done remarkably well and is doing well with conservative therapy.  Patient has strengthening core significantly.  Discussed icing regimen and home exercises, discussed which activities to do and which ones to avoid.  Increase activity slowly.  Discussed icing regimen.  Follow-up again in 6 to 8 weeks otherwise.    Nonallopathic problems  Decision today to treat with OMT was based on Physical Exam  After verbal consent patient was treated with HVLA, ME, FPR techniques  in cervical, rib, thoracic, lumbar, and sacral  areas  Patient tolerated the procedure well with improvement in symptoms  Patient given exercises, stretches and lifestyle modifications  See medications in patient instructions if given  Patient will follow up in 4-8 weeks    The above documentation has been reviewed and is accurate and complete Judi Saa,  DO          Note: This dictation was prepared with Dragon dictation along with smaller phrase technology. Any transcriptional errors that result from this process are unintentional.

## 2023-12-21 ENCOUNTER — Encounter: Payer: Self-pay | Admitting: Internal Medicine

## 2023-12-21 NOTE — Patient Instructions (Addendum)
      Blood work was ordered.       Medications changes include :   None    A referral was ordered and someone will call you to schedule an appointment.     Return in about 6 months (around 06/22/2024) for Physical Exam.

## 2023-12-21 NOTE — Progress Notes (Unsigned)
 Subjective:    Patient ID: Margaret Deleon, female    DOB: 1981-02-06, 43 y.o.   MRN: [id number]     HPI Margaret Deleon is here for follow up of her chronic medical problems.  Has some increased stress.  Has had some personal stress and work stress.  Feels like she is doing well.  Her medications are working well.    She is working out regularly, which is a big help for her mood overall.   Medications and allergies reviewed with patient and updated if appropriate.  Current Outpatient Medications on File Prior to Visit  Medication Sig Dispense Refill   AMBULATORY NON FORMULARY MEDICATION Nitroglycerin ointment 0.125 %  Apply a pea sized amount internally two times daily for 6 to 8 weeks Dispense 30 GM zero refill 30 g 0   amLODipine-olmesartan (AZOR) 5-20 MG tablet TAKE 1 TABLET BY MOUTH DAILY 30 tablet 0   buPROPion (WELLBUTRIN XL) 150 MG 24 hr tablet TAKE 1 TABLET(150 MG) BY MOUTH DAILY 30 tablet 0   cyclobenzaprine (FLEXERIL) 10 MG tablet TAKE 1/2 TO 1 TABLET(5 TO 10 MG) BY MOUTH AT BEDTIME 45 tablet 0   hydrocortisone 2.5 % cream Apply topically 2 (two) times daily as needed. APPLY EXTERNALLY TO THE AFFECTED AREA TWICE DAILY AS NEEDED 30 g 0   phentermine (ADIPEX-P) 37.5 MG tablet Take 37.5 mg by mouth daily.     prochlorperazine (COMPAZINE) 10 MG tablet Take 1 tablet (10 mg total) by mouth every 8 (eight) hours as needed for refractory nausea / vomiting (refractory headache). 15 tablet 0   sertraline (ZOLOFT) 50 MG tablet Take 50 mg by mouth daily.     No current facility-administered medications on file prior to visit.     Review of Systems  Constitutional:  Negative for fever.  Respiratory:  Negative for cough, shortness of breath and wheezing.   Cardiovascular:  Negative for chest pain, palpitations and leg swelling.  Neurological:  Positive for headaches (occ - tension HAs). Negative for light-headedness.  Psychiatric/Behavioral:  Positive for dysphoric mood  (controlled). The patient is nervous/anxious (controlled).        Objective:   Vitals:   12/22/23 1100  BP: 120/70  Pulse: 67  Temp: 98.6 F (37 C)   BP Readings from Last 3 Encounters:  12/22/23 120/70  12/22/23 120/70  12/18/23 (!) 140/68   Wt Readings from Last 3 Encounters:  12/22/23 167 lb (75.8 kg)  12/22/23 167 lb (75.8 kg)  12/18/23 168 lb (76.2 kg)   Body mass index is 26.16 kg/m.    Physical Exam Constitutional:      General: She is not in acute distress.    Appearance: Normal appearance.  HENT:     Head: Normocephalic and atraumatic.  Eyes:     Conjunctiva/sclera: Conjunctivae normal.  Cardiovascular:     Rate and Rhythm: Normal rate and regular rhythm.     Heart sounds: Normal heart sounds.  Pulmonary:     Effort: Pulmonary effort is normal. No respiratory distress.     Breath sounds: Normal breath sounds. No wheezing.  Musculoskeletal:     Cervical back: Neck supple.     Right lower leg: No edema.     Left lower leg: No edema.  Lymphadenopathy:     Cervical: No cervical adenopathy.  Skin:    General: Skin is warm and dry.     Findings: No rash.  Neurological:     Mental Status: She is alert.  Mental status is at baseline.  Psychiatric:        Mood and Affect: Mood normal.        Behavior: Behavior normal.        Lab Results  Component Value Date   WBC 5.7 10/07/2022   HGB 13.6 10/07/2022   HCT 40.0 10/07/2022   PLT 289.0 10/07/2022   GLUCOSE 89 10/07/2022   CHOL 166 10/07/2022   TRIG 132.0 10/07/2022   HDL 62.10 10/07/2022   LDLCALC 78 10/07/2022   ALT 12 10/07/2022   AST 21 10/07/2022   NA 138 10/07/2022   K 4.5 10/07/2022   CL 102 10/07/2022   CREATININE 0.98 10/07/2022   BUN 9 10/07/2022   CO2 29 10/07/2022   TSH 2.09 10/07/2022   HGBA1C 5.0 10/07/2022   MICROALBUR <0.7 02/18/2022     Assessment & Plan:    See Problem List for Assessment and Plan of chronic medical problems.

## 2023-12-22 ENCOUNTER — Ambulatory Visit (INDEPENDENT_AMBULATORY_CARE_PROVIDER_SITE_OTHER): Admitting: Internal Medicine

## 2023-12-22 ENCOUNTER — Ambulatory Visit (INDEPENDENT_AMBULATORY_CARE_PROVIDER_SITE_OTHER): Payer: 59 | Admitting: Family Medicine

## 2023-12-22 VITALS — BP 120/70 | HR 67 | Temp 98.6°F | Ht 67.0 in | Wt 167.0 lb

## 2023-12-22 VITALS — BP 120/70 | HR 69 | Ht 67.0 in | Wt 167.0 lb

## 2023-12-22 DIAGNOSIS — F419 Anxiety disorder, unspecified: Secondary | ICD-10-CM

## 2023-12-22 DIAGNOSIS — M9902 Segmental and somatic dysfunction of thoracic region: Secondary | ICD-10-CM | POA: Diagnosis not present

## 2023-12-22 DIAGNOSIS — M9904 Segmental and somatic dysfunction of sacral region: Secondary | ICD-10-CM | POA: Diagnosis not present

## 2023-12-22 DIAGNOSIS — M9903 Segmental and somatic dysfunction of lumbar region: Secondary | ICD-10-CM

## 2023-12-22 DIAGNOSIS — F3289 Other specified depressive episodes: Secondary | ICD-10-CM | POA: Diagnosis not present

## 2023-12-22 DIAGNOSIS — I10 Essential (primary) hypertension: Secondary | ICD-10-CM

## 2023-12-22 DIAGNOSIS — M9908 Segmental and somatic dysfunction of rib cage: Secondary | ICD-10-CM | POA: Diagnosis not present

## 2023-12-22 DIAGNOSIS — G43009 Migraine without aura, not intractable, without status migrainosus: Secondary | ICD-10-CM | POA: Diagnosis not present

## 2023-12-22 DIAGNOSIS — M5416 Radiculopathy, lumbar region: Secondary | ICD-10-CM | POA: Diagnosis not present

## 2023-12-22 DIAGNOSIS — M9901 Segmental and somatic dysfunction of cervical region: Secondary | ICD-10-CM | POA: Diagnosis not present

## 2023-12-22 DIAGNOSIS — E559 Vitamin D deficiency, unspecified: Secondary | ICD-10-CM

## 2023-12-22 LAB — COMPREHENSIVE METABOLIC PANEL WITH GFR
ALT: 15 U/L (ref 0–35)
AST: 22 U/L (ref 0–37)
Albumin: 4.9 g/dL (ref 3.5–5.2)
Alkaline Phosphatase: 40 U/L (ref 39–117)
BUN: 14 mg/dL (ref 6–23)
CO2: 26 meq/L (ref 19–32)
Calcium: 9.5 mg/dL (ref 8.4–10.5)
Chloride: 104 meq/L (ref 96–112)
Creatinine, Ser: 1.06 mg/dL (ref 0.40–1.20)
GFR: 65.09 mL/min (ref >60.00)
Glucose, Bld: 90 mg/dL (ref 70–99)
Potassium: 4.5 meq/L (ref 3.5–5.1)
Sodium: 138 meq/L (ref 135–145)
Total Bilirubin: 0.5 mg/dL (ref 0.2–1.2)
Total Protein: 7.4 g/dL (ref 6.0–8.3)

## 2023-12-22 LAB — VITAMIN D 25 HYDROXY (VIT D DEFICIENCY, FRACTURES): VITD: 27.99 ng/mL — ABNORMAL LOW (ref 30.00–100.00)

## 2023-12-22 NOTE — Assessment & Plan Note (Signed)
 Chronic Taking vitamin D daily Check vitamin D level

## 2023-12-22 NOTE — Assessment & Plan Note (Signed)
 Chronic Controlled, stable Continue bupropion xl 150 mg daily, sertraline 50 mg daily

## 2023-12-22 NOTE — Assessment & Plan Note (Signed)
 Chronic Blood pressure well controlled CMP Continue amlodipine-olmesartan 5-20 mg daily

## 2023-12-22 NOTE — Assessment & Plan Note (Signed)
Chronic Occasional migraines - from muscle tightness in upper back Continue flexeril 10 mg daily prn Continue ibuprofen as needed

## 2023-12-22 NOTE — Assessment & Plan Note (Signed)
 Patient has done remarkably well and is doing well with conservative therapy.  Patient has strengthening core significantly.  Discussed icing regimen and home exercises, discussed which activities to do and which ones to avoid.  Increase activity slowly.  Discussed icing regimen.  Follow-up again in 6 to 8 weeks otherwise.

## 2023-12-22 NOTE — Patient Instructions (Signed)
 Thanks for the admission to the gun show! See you again in 8 weeks.

## 2023-12-22 NOTE — Assessment & Plan Note (Signed)
 Chronic Fairly controlled, Stable Continue  bupropion XL 150 mg daily, sertraline 50 mg daily

## 2023-12-23 ENCOUNTER — Encounter: Payer: Self-pay | Admitting: Internal Medicine

## 2023-12-25 ENCOUNTER — Telehealth: Payer: Self-pay | Admitting: Gastroenterology

## 2023-12-25 NOTE — Telephone Encounter (Signed)
 Inbound call from [place] at [facility] stating she received a referral for patient for anal fissure and hemorrhoids. States she is unclear on which patient is needing to be scheduled for. Requesting a call back at [phone] to received clarification. Please advise thank you.

## 2023-12-25 NOTE — Telephone Encounter (Signed)
 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  (Re: per Dr. Frankey Shown last note, patient was referred for anal fissure and external skin tag.

## 2023-12-26 NOTE — Telephone Encounter (Signed)
 Spoke with Margaret Deleon from [facility] & answered all questions regarding referral. She will reach out to patient for scheduling.

## 2023-12-29 NOTE — Telephone Encounter (Signed)
 Returned call to [facility] and left a message on [employer] that referral has been resent with demographic information attached. Advised to contact our office with any further questions or concerns.

## 2023-12-29 NOTE — Telephone Encounter (Signed)
 Inbound call from [place] at [facility], would like to speak to a nurse in regards to patient referral. They state they did not receive patient demographics and would like those faxed.

## 2024-01-10 ENCOUNTER — Other Ambulatory Visit: Payer: Self-pay | Admitting: Internal Medicine

## 2024-01-29 ENCOUNTER — Other Ambulatory Visit: Payer: Self-pay

## 2024-01-29 ENCOUNTER — Encounter: Payer: Self-pay | Admitting: Internal Medicine

## 2024-01-29 MED ORDER — BUPROPION HCL ER (XL) 150 MG PO TB24: ORAL_TABLET | ORAL | 1 refills | Status: DC

## 2024-01-29 MED ORDER — AMLODIPINE-OLMESARTAN 5-20 MG PO TABS: 1.0000 | ORAL_TABLET | Freq: Every day | ORAL | 1 refills | Status: DC

## 2024-02-12 ENCOUNTER — Other Ambulatory Visit: Payer: Self-pay | Admitting: Internal Medicine

## 2024-02-13 ENCOUNTER — Other Ambulatory Visit: Payer: Self-pay | Admitting: Internal Medicine

## 2024-02-13 NOTE — Progress Notes (Deleted)
  [name] [address] [place] [address] Phone: [phone] Subjective:    I'm seeing this patient by the request  of:  [name], MD  CC:   [id number]  Margaret Deleon is a 43 y.o. female coming in with complaint of back and neck pain. OMT 12/22/2023. Patient states   Medications patient has been prescribed: None  Taking:         Reviewed prior external information including notes and imaging from previsou exam, outside providers and external EMR if available.   As well as notes that were available from care everywhere and other healthcare systems.  Past medical history, social, surgical and family history all reviewed in electronic medical record.  No pertanent information unless stated regarding to the chief complaint.   Past Medical History:  Diagnosis Date   Anxiety 09/17/2017   Depression    Elevated blood pressure reading 10/14/2017   Elevated blood pressure   Hypertension    Migraine headache 09/17/2017   Has been on propranolol  and labetalol in the past Did not tolerate Topamax  Takes Excedrin Migraine as needed, has not tried has tried Fioricet once   Migraines    Muscle tightness 09/17/2017   Neck pain 09/17/2017   Nonallopathic lesion of cervical region 10/06/2017   Nonallopathic lesion of lumbosacral region 10/06/2017   Nonallopathic lesion of thoracic region 10/06/2017   Palpitations 09/17/2017   Palpitations    Allergies  Allergen Reactions   Codeine Nausea And Vomiting   Hydrochlorothiazide  Rash   Prozac  [Fluoxetine  Hcl]     Increased anxiety   Topamax  [Topiramate ]     Felt weird, high like and tired     Review of Systems:  No headache, visual changes, nausea, vomiting, diarrhea, constipation, dizziness, abdominal pain, skin rash, fevers, chills, night sweats, weight loss, swollen lymph nodes, body aches, joint swelling, chest pain, shortness of breath, mood changes. POSITIVE muscle aches  Objective  There  were no vitals taken for this visit.   General: No apparent distress alert and oriented x3 mood and affect normal, dressed appropriately.  HEENT: Pupils equal, extraocular movements intact  Respiratory: Patient's speak in full sentences and does not appear short of breath  Cardiovascular: No lower extremity edema, non tender, no erythema  Gait MSK:  Back   Osteopathic findings  C2 flexed rotated and side bent right C6 flexed rotated and side bent left T3 extended rotated and side bent right inhaled rib T9 extended rotated and side bent left L2 flexed rotated and side bent right Sacrum right on right       Assessment and Plan:  No problem-specific Assessment & Plan notes found for this encounter.    Nonallopathic problems  Decision today to treat with OMT was based on Physical Exam  After verbal consent patient was treated with HVLA, ME, FPR techniques in cervical, rib, thoracic, lumbar, and sacral  areas  Patient tolerated the procedure well with improvement in symptoms  Patient given exercises, stretches and lifestyle modifications  See medications in patient instructions if given  Patient will follow up in 4-8 weeks             Note: This dictation was prepared with Dragon dictation along with smaller phrase technology. Any transcriptional errors that result from this process are unintentional.

## 2024-02-16 ENCOUNTER — Ambulatory Visit: Admitting: Family Medicine

## 2024-03-11 NOTE — Progress Notes (Signed)
 [facility] [address] [place] [address] Phone: [phone] Subjective:   Margaret Deleon, am serving as a scribe for Dr. Arthea Claudene.  I'm seeing this patient by the request  of:  Geofm Glade PARAS, MD  CC: back and nekc pain   YEP:Dlagzrupcz  Margaret Deleon is a 43 y.o. female coming in with complaint of back and neck pain. OMT 12/22/2023. Patient states doing well. No new symptoms or concerns. Same as last visit.  Patient has changed jobs since we have seen her.  Now having more difficulty with some ergonomics but making changes.  Mobility not stopping her from activity at the moment.  Medications patient has been prescribed: None  Taking:         Reviewed prior external information including notes and imaging from previsou exam, outside providers and external EMR if available.   As well as notes that were available from care everywhere and other healthcare systems.  Past medical history, social, surgical and family history all reviewed in electronic medical record.  No pertanent information unless stated regarding to the chief complaint.   Past Medical History:  Diagnosis Date   Anxiety 09/17/2017   Depression    Elevated blood pressure reading 10/14/2017   Elevated blood pressure   Hypertension    Migraine headache 09/17/2017   Has been on propranolol  and labetalol in the past Did not tolerate Topamax  Takes Excedrin Migraine as needed, has not tried has tried Fioricet once   Migraines    Muscle tightness 09/17/2017   Neck pain 09/17/2017   Nonallopathic lesion of cervical region 10/06/2017   Nonallopathic lesion of lumbosacral region 10/06/2017   Nonallopathic lesion of thoracic region 10/06/2017   Palpitations 09/17/2017   Palpitations    Allergies  Allergen Reactions   Codeine Nausea And Vomiting   Hydrochlorothiazide  Rash   Prozac  [Fluoxetine  Hcl]     Increased anxiety   Topamax  [Topiramate ]     Felt weird, high like and tired      Review of Systems:  No headache, visual changes, nausea, vomiting, diarrhea, constipation, dizziness, abdominal pain, skin rash, fevers, chills, night sweats, weight loss, swollen lymph nodes, body aches, joint swelling, chest pain, shortness of breath, mood changes. POSITIVE muscle aches  Objective  Blood pressure 126/78, pulse 97, height 5' 7 (1.702 m), weight 163 lb (73.9 kg), SpO2 98%.   General: No apparent distress alert and oriented x3 mood and affect normal, dressed appropriately.  HEENT: Pupils equal, extraocular movements intact  Respiratory: Patient's speak in full sentences and does not appear short of breath  Cardiovascular: No lower extremity edema, non tender, no erythema  Gait MSK:  Back does have some loss lordosis noted.  Some tenderness to palpation more in the scapular area.  Osteopathic findings  C2 flexed rotated and side bent right C7 flexed rotated and side bent right T3 extended rotated and side bent right inhaled rib T9 extended rotated and side bent left T11 extended rotated and side bent right L2 flexed rotated and side bent right Sacrum right on right       Assessment and Plan:  No problem-specific Assessment & Plan notes found for this encounter.    Nonallopathic problems  Decision today to treat with OMT was based on Physical Exam  After verbal consent patient was treated with HVLA, ME, FPR techniques in cervical, rib, thoracic, lumbar, and sacral  areas  Patient tolerated the procedure well with improvement in symptoms  Patient  given exercises, stretches and lifestyle modifications  See medications in patient instructions if given  Patient will follow up in 4-8 weeks    The above documentation has been reviewed and is accurate and complete Margaret Deleon M Margaret Stong, DO          Note: This dictation was prepared with Dragon dictation along with smaller phrase technology. Any transcriptional errors that result from this process are  unintentional.

## 2024-03-15 ENCOUNTER — Encounter: Payer: Self-pay | Admitting: Family Medicine

## 2024-03-15 ENCOUNTER — Ambulatory Visit (INDEPENDENT_AMBULATORY_CARE_PROVIDER_SITE_OTHER): Admitting: Family Medicine

## 2024-03-15 VITALS — BP 126/78 | HR 97 | Ht 67.0 in | Wt 163.0 lb

## 2024-03-15 DIAGNOSIS — M9908 Segmental and somatic dysfunction of rib cage: Secondary | ICD-10-CM

## 2024-03-15 DIAGNOSIS — M9903 Segmental and somatic dysfunction of lumbar region: Secondary | ICD-10-CM | POA: Diagnosis not present

## 2024-03-15 DIAGNOSIS — M9901 Segmental and somatic dysfunction of cervical region: Secondary | ICD-10-CM | POA: Diagnosis not present

## 2024-03-15 DIAGNOSIS — M9904 Segmental and somatic dysfunction of sacral region: Secondary | ICD-10-CM

## 2024-03-15 DIAGNOSIS — M5416 Radiculopathy, lumbar region: Secondary | ICD-10-CM

## 2024-03-15 DIAGNOSIS — M9902 Segmental and somatic dysfunction of thoracic region: Secondary | ICD-10-CM

## 2024-03-15 NOTE — Assessment & Plan Note (Signed)
 Tightness noted in the paraspinal musculature of the lumbar spine.  Tightness noted with Deri right greater than left.  Continue to be active otherwise.  Increase activity slowly.  Follow-up again in 6 to 8 weeks

## 2024-03-15 NOTE — Patient Instructions (Signed)
 Glad everything worked out Have a great time at wedding See you again in 2-3 months

## 2024-05-19 ENCOUNTER — Encounter: Payer: Self-pay | Admitting: Internal Medicine

## 2024-05-19 MED ORDER — SERTRALINE HCL 50 MG PO TABS: 50.0000 mg | ORAL_TABLET | Freq: Every day | ORAL | 1 refills | Status: AC

## 2024-05-21 NOTE — Progress Notes (Unsigned)
 [facility] [address] [place] [address] Phone: [phone] Subjective:   ISusannah Gully, am serving as a scribe for Dr. Arthea Claudene.  I'm seeing this patient by the request  of:  Geofm Glade PARAS, MD  CC: Back and neck pain follow-up  YEP:Dlagzrupcz  Alvin Rubano is a 43 y.o. female coming in with complaint of back and neck pain. OMT 03/15/2024. Patient states lower back and hip hurting a bunch. Slept wrong last night and neck very stiff.   Medications patient has been prescribed: None          Reviewed prior external information including notes and imaging from previsou exam, outside providers and external EMR if available.   As well as notes that were available from care everywhere and other healthcare systems.  Past medical history, social, surgical and family history all reviewed in electronic medical record.  No pertanent information unless stated regarding to the chief complaint.   Past Medical History:  Diagnosis Date   Anxiety 09/17/2017   Depression    Elevated blood pressure reading 10/14/2017   Elevated blood pressure   Hypertension    Migraine headache 09/17/2017   Has been on propranolol  and labetalol in the past Did not tolerate Topamax  Takes Excedrin Migraine as needed, has not tried has tried Fioricet once   Migraines    Muscle tightness 09/17/2017   Neck pain 09/17/2017   Nonallopathic lesion of cervical region 10/06/2017   Nonallopathic lesion of lumbosacral region 10/06/2017   Nonallopathic lesion of thoracic region 10/06/2017   Palpitations 09/17/2017   Palpitations    Allergies  Allergen Reactions   Codeine Nausea And Vomiting   Hydrochlorothiazide  Rash   Prozac  [Fluoxetine  Hcl]     Increased anxiety   Topamax  [Topiramate ]     Felt weird, high like and tired     Review of Systems:  No headache, visual changes, nausea, vomiting, diarrhea, constipation, dizziness, abdominal pain, skin rash, fevers, chills,  night sweats, weight loss, swollen lymph nodes, body aches, joint swelling, chest pain, shortness of breath, mood changes. POSITIVE muscle aches  Objective  Blood pressure 112/78, pulse 76, height 5' 7 (1.702 m), weight 161 lb (73 kg), SpO2 98%.   General: No apparent distress alert and oriented x3 mood and affect normal, dressed appropriately.  HEENT: Pupils equal, extraocular movements intact  Respiratory: Patient's speak in full sentences and does not appear short of breath  Cardiovascular: No lower extremity edema, non tender, no erythema  Gait MSK:  Back significant tightness noted over the right sacroiliac joint.  Tightness noted in the paraspinal musculature of the lumbar spine as well all on the right side.  Does have worsening discomfort actually with extension but no CVA tenderness.  Osteopathic findings  C2 flexed rotated and side bent right C6 flexed rotated and side bent left T3 extended rotated and side bent right inhaled rib T7 extended rotated and side bent right L2 flexed rotated and side bent right L3 flexed rotated and side bent left L5 flexed rotated and side bent right Sacrum right on right       Assessment and Plan:  Lumbar radiculopathy, right Continue to have difficulty.  Discussed icing regimen and home exercises.  Increase activity slowly.  Discussed icing regimen.  Follow-up again in 6 to 8 weeks.  Patient is getting ready to run.  I think she is going to do extremely well.  Depression Patient is having some difficulty with this as well  as past trauma.  Will refer accordingly.  Patient has an action plan and will follow-up as an else is necessary.  Denies any suicidal or homicidal ideation and more depression and anxiety than anything else at this time    Nonallopathic problems  Decision today to treat with OMT was based on Physical Exam  After verbal consent patient was treated with HVLA, ME, FPR techniques in cervical, rib, thoracic, lumbar, and  sacral  areas  Patient tolerated the procedure well with improvement in symptoms  Patient given exercises, stretches and lifestyle modifications  See medications in patient instructions if given  Patient will follow up in 4-8 weeks    The above documentation has been reviewed and is accurate and complete Arthea CHRISTELLA Sharps, DO          Note: This dictation was prepared with Dragon dictation along with smaller phrase technology. Any transcriptional errors that result from this process are unintentional.

## 2024-05-24 ENCOUNTER — Ambulatory Visit

## 2024-05-24 ENCOUNTER — Ambulatory Visit (INDEPENDENT_AMBULATORY_CARE_PROVIDER_SITE_OTHER): Admitting: Family Medicine

## 2024-05-24 ENCOUNTER — Encounter: Payer: Self-pay | Admitting: Family Medicine

## 2024-05-24 VITALS — BP 112/78 | HR 76 | Ht 67.0 in | Wt 161.0 lb

## 2024-05-24 DIAGNOSIS — M5416 Radiculopathy, lumbar region: Secondary | ICD-10-CM

## 2024-05-24 DIAGNOSIS — D229 Melanocytic nevi, unspecified: Secondary | ICD-10-CM

## 2024-05-24 DIAGNOSIS — M9904 Segmental and somatic dysfunction of sacral region: Secondary | ICD-10-CM

## 2024-05-24 DIAGNOSIS — M9908 Segmental and somatic dysfunction of rib cage: Secondary | ICD-10-CM

## 2024-05-24 DIAGNOSIS — M9901 Segmental and somatic dysfunction of cervical region: Secondary | ICD-10-CM | POA: Diagnosis not present

## 2024-05-24 DIAGNOSIS — F3289 Other specified depressive episodes: Secondary | ICD-10-CM | POA: Diagnosis not present

## 2024-05-24 DIAGNOSIS — M9902 Segmental and somatic dysfunction of thoracic region: Secondary | ICD-10-CM | POA: Diagnosis not present

## 2024-05-24 DIAGNOSIS — M9903 Segmental and somatic dysfunction of lumbar region: Secondary | ICD-10-CM

## 2024-05-24 DIAGNOSIS — F4310 Post-traumatic stress disorder, unspecified: Secondary | ICD-10-CM | POA: Diagnosis not present

## 2024-05-24 NOTE — Assessment & Plan Note (Signed)
 Patient is having some difficulty with this as well as past trauma.  Will refer accordingly.  Patient has an action plan and will follow-up as an else is necessary.  Denies any suicidal or homicidal ideation and more depression and anxiety than anything else at this time

## 2024-05-24 NOTE — Patient Instructions (Addendum)
 Remember the pelvis trick [facility]erral [facility] See you again in 2 months

## 2024-05-24 NOTE — Assessment & Plan Note (Signed)
 Continue to have difficulty.  Discussed icing regimen and home exercises.  Increase activity slowly.  Discussed icing regimen.  Follow-up again in 6 to 8 weeks.  Patient is getting ready to run.  I think she is going to do extremely well.

## 2024-05-24 NOTE — Addendum Note (Signed)
 Addended by: DESIDERIO SUSANNAH SAUNDERS on: 05/24/2024 08:44 AM   Modules accepted: Orders

## 2024-05-31 ENCOUNTER — Ambulatory Visit: Payer: Self-pay | Admitting: Family Medicine

## 2024-06-28 ENCOUNTER — Encounter: Admitting: Internal Medicine

## 2024-07-15 NOTE — Progress Notes (Deleted)
  [facility] [address] [place] [address] Phone: [phone] Subjective:    I'm seeing this patient by the request  of:  Geofm Glade PARAS, MD  CC:   Margaret Deleon  Margaret Deleon is a 43 y.o. female coming in with complaint of back and neck pain. OMT 05/24/2024. Patient states   Medications patient has been prescribed: None  Taking:         Reviewed prior external information including notes and imaging from previsou exam, outside providers and external EMR if available.   As well as notes that were available from care everywhere and other healthcare systems.  Past medical history, social, surgical and family history all reviewed in electronic medical record.  No pertanent information unless stated regarding to the chief complaint.   Past Medical History:  Diagnosis Date   Anxiety 09/17/2017   Depression    Elevated blood pressure reading 10/14/2017   Elevated blood pressure   Hypertension    Migraine headache 09/17/2017   Has been on propranolol  and labetalol in the past Did not tolerate Topamax  Takes Excedrin Migraine as needed, has not tried has tried Fioricet once   Migraines    Muscle tightness 09/17/2017   Neck pain 09/17/2017   Nonallopathic lesion of cervical region 10/06/2017   Nonallopathic lesion of lumbosacral region 10/06/2017   Nonallopathic lesion of thoracic region 10/06/2017   Palpitations 09/17/2017   Palpitations    Allergies  Allergen Reactions   Codeine Nausea And Vomiting   Hydrochlorothiazide  Rash   Prozac  [Fluoxetine  Hcl]     Increased anxiety   Topamax  [Topiramate ]     Felt weird, high like and tired     Review of Systems:  No headache, visual changes, nausea, vomiting, diarrhea, constipation, dizziness, abdominal pain, skin rash, fevers, chills, night sweats, weight loss, swollen lymph nodes, body aches, joint swelling, chest pain, shortness of breath, mood changes. POSITIVE muscle aches  Objective  There  were no vitals taken for this visit.   General: No apparent distress alert and oriented x3 mood and affect normal, dressed appropriately.  HEENT: Pupils equal, extraocular movements intact  Respiratory: Patient's speak in full sentences and does not appear short of breath  Cardiovascular: No lower extremity edema, non tender, no erythema  Gait MSK:  Back   Osteopathic findings  C2 flexed rotated and side bent right C6 flexed rotated and side bent left T3 extended rotated and side bent right inhaled rib T9 extended rotated and side bent left L2 flexed rotated and side bent right Sacrum right on right       Assessment and Plan:  No problem-specific Assessment & Plan notes found for this encounter.    Nonallopathic problems  Decision today to treat with OMT was based on Physical Exam  After verbal consent patient was treated with HVLA, ME, FPR techniques in cervical, rib, thoracic, lumbar, and sacral  areas  Patient tolerated the procedure well with improvement in symptoms  Patient given exercises, stretches and lifestyle modifications  See medications in patient instructions if given  Patient will follow up in 4-8 weeks             Note: This dictation was prepared with Dragon dictation along with smaller phrase technology. Any transcriptional errors that result from this process are unintentional.

## 2024-07-19 ENCOUNTER — Ambulatory Visit: Admitting: Family Medicine

## 2024-08-10 NOTE — Progress Notes (Signed)
 [facility] [address] [place] [address] Phone: [phone] Subjective:   Margaret Deleon, am serving as a scribe for Dr. Arthea Claudene.  I'm seeing this patient by the request  of:  Geofm Glade PARAS, MD  CC: Back and neck pain follow-up  YEP:Dlagzrupcz  Margaret Deleon is a 43 y.o. female coming in with complaint of back and neck pain. OMT 05/24/2024. Patient states same per usual. No new symptoms.  Medications patient has been prescribed: None  Taking:         Reviewed prior external information including notes and imaging from previsou exam, outside providers and external EMR if available.   As well as notes that were available from care everywhere and other healthcare systems.  Past medical history, social, surgical and family history all reviewed in electronic medical record.  No pertanent information unless stated regarding to the chief complaint.   Past Medical History:  Diagnosis Date   Anxiety 09/17/2017   Depression    Elevated blood pressure reading 10/14/2017   Elevated blood pressure   Hypertension    Migraine headache 09/17/2017   Has been on propranolol  and labetalol in the past Did not tolerate Topamax  Takes Excedrin Migraine as needed, has not tried has tried Fioricet once   Migraines    Muscle tightness 09/17/2017   Neck pain 09/17/2017   Nonallopathic lesion of cervical region 10/06/2017   Nonallopathic lesion of lumbosacral region 10/06/2017   Nonallopathic lesion of thoracic region 10/06/2017   Palpitations 09/17/2017   Palpitations    Allergies  Allergen Reactions   Codeine Nausea And Vomiting   Hydrochlorothiazide  Rash   Prozac  [Fluoxetine  Hcl]     Increased anxiety   Topamax  [Topiramate ]     Felt weird, high like and tired     Review of Systems:  No headache, visual changes, nausea, vomiting, diarrhea, constipation, dizziness, abdominal pain, skin rash, fevers, chills, night sweats, weight loss, swollen lymph  nodes, body aches, joint swelling, chest pain, shortness of breath, mood changes. POSITIVE muscle aches  Objective  There were no vitals taken for this visit.   General: No apparent distress alert and oriented x3 mood and affect normal, dressed appropriately.  HEENT: Pupils equal, extraocular movements intact  Respiratory: Patient's speak in full sentences and does not appear short of breath  Cardiovascular: No lower extremity edema, non tender, no erythema  Gait relatively normal MSK:  Back does have some mild loss of lordosis.  Tightness still noted on the right sacroiliac joint. Tenderness to palpation in the paraspinal musculature on the right side as well.  Osteopathic findings   C5 flexed rotated and side bent right T5 extended rotated and side bent right inhaled rib L2 flexed rotated and side bent right L4 flexed rotated and side bent right Sacrum right on right       Assessment and Plan:  Lumbar radiculopathy, right Patient is doing relatively well, discussed and changing patient's running form somewhat.  Discussed icing regimen and home exercises.  Increase activity slowly.  Discussed icing regimen.  Continue work on designer, fashion/clothing.  Follow-up again in 6 to 12 weeks.      Nonallopathic problems  Decision today to treat with OMT was based on Physical Exam  After verbal consent patient was treated with HVLA, ME, FPR techniques in cervical, rib, thoracic, lumbar, and sacral  areas  Patient tolerated the procedure well with improvement in symptoms  Patient given exercises, stretches and lifestyle modifications  See medications in patient instructions if given  Patient will follow up in 4-8 weeks    The above documentation has been reviewed and is accurate and complete Margaret Deleon M Margaret Eid, DO          Note: This dictation was prepared with Dragon dictation along with smaller phrase technology. Any transcriptional errors that result from this process are  unintentional.

## 2024-08-16 ENCOUNTER — Ambulatory Visit: Admitting: Family Medicine

## 2024-08-16 ENCOUNTER — Encounter: Payer: Self-pay | Admitting: Family Medicine

## 2024-08-16 VITALS — BP 106/62 | HR 77 | Ht 67.0 in | Wt 167.0 lb

## 2024-08-16 DIAGNOSIS — M9908 Segmental and somatic dysfunction of rib cage: Secondary | ICD-10-CM

## 2024-08-16 DIAGNOSIS — M9904 Segmental and somatic dysfunction of sacral region: Secondary | ICD-10-CM

## 2024-08-16 DIAGNOSIS — M9901 Segmental and somatic dysfunction of cervical region: Secondary | ICD-10-CM

## 2024-08-16 DIAGNOSIS — M5416 Radiculopathy, lumbar region: Secondary | ICD-10-CM

## 2024-08-16 DIAGNOSIS — M9903 Segmental and somatic dysfunction of lumbar region: Secondary | ICD-10-CM

## 2024-08-16 DIAGNOSIS — M9902 Segmental and somatic dysfunction of thoracic region: Secondary | ICD-10-CM

## 2024-08-16 NOTE — Assessment & Plan Note (Signed)
 Patient is doing relatively well, discussed and changing patient's running form somewhat.  Discussed icing regimen and home exercises.  Increase activity slowly.  Discussed icing regimen.  Continue work on designer, fashion/clothing.  Follow-up again in 6 to 12 weeks.

## 2024-08-17 ENCOUNTER — Other Ambulatory Visit: Payer: Self-pay | Admitting: Internal Medicine

## 2024-08-30 ENCOUNTER — Ambulatory Visit: Admitting: Family Medicine

## 2024-09-13 ENCOUNTER — Encounter: Payer: Self-pay | Admitting: Physician Assistant

## 2024-09-13 ENCOUNTER — Ambulatory Visit (INDEPENDENT_AMBULATORY_CARE_PROVIDER_SITE_OTHER): Admitting: Physician Assistant

## 2024-09-13 VITALS — BP 123/83

## 2024-09-13 DIAGNOSIS — Z1283 Encounter for screening for malignant neoplasm of skin: Secondary | ICD-10-CM | POA: Diagnosis not present

## 2024-09-13 DIAGNOSIS — D2372 Other benign neoplasm of skin of left lower limb, including hip: Secondary | ICD-10-CM

## 2024-09-13 DIAGNOSIS — D239 Other benign neoplasm of skin, unspecified: Secondary | ICD-10-CM

## 2024-09-13 DIAGNOSIS — W908XXA Exposure to other nonionizing radiation, initial encounter: Secondary | ICD-10-CM

## 2024-09-13 DIAGNOSIS — L578 Other skin changes due to chronic exposure to nonionizing radiation: Secondary | ICD-10-CM | POA: Diagnosis not present

## 2024-09-13 DIAGNOSIS — L820 Inflamed seborrheic keratosis: Secondary | ICD-10-CM | POA: Diagnosis not present

## 2024-09-13 DIAGNOSIS — D229 Melanocytic nevi, unspecified: Secondary | ICD-10-CM

## 2024-09-13 DIAGNOSIS — D225 Melanocytic nevi of trunk: Secondary | ICD-10-CM | POA: Diagnosis not present

## 2024-09-13 DIAGNOSIS — D485 Neoplasm of uncertain behavior of skin: Secondary | ICD-10-CM | POA: Diagnosis not present

## 2024-09-13 DIAGNOSIS — L814 Other melanin hyperpigmentation: Secondary | ICD-10-CM

## 2024-09-13 DIAGNOSIS — D1801 Hemangioma of skin and subcutaneous tissue: Secondary | ICD-10-CM

## 2024-09-13 NOTE — Progress Notes (Signed)
 "  New Patient Visit   Subjective  Margaret Deleon is a 43 y.o. female NEW PATIENT who presents for the following:   Total Body Skin Exam (TBSE)  The patient reports she has spots, moles and lesions to be evaluated, some may be new or changing and the patient may have concern these could be cancer. Patient has previously been treated by a dermatologist. Denied Hx of Bx. Denied family Hx of skin cancers. Patient does apply sunscreen and/or wears protective coverings.  The following portions of the chart were reviewed this encounter and updated as appropriate: medications, allergies, medical history  Review of Systems:  No other skin or systemic complaints except as noted in HPI or Assessment and Plan.  Objective  Well appearing patient in no apparent distress; mood and affect are within normal limits.  A full examination was performed including scalp, head, eyes, ears, nose, lips, neck, chest, axillae, abdomen, back, buttocks, bilateral upper extremities, bilateral lower extremities, hands, feet, fingers, toes, fingernails, and toenails. All findings within normal limits unless otherwise noted below.   Relevant exam findings are noted in the Assessment and Plan.        Left inferior Neck 6mm pink papule        R mid back 4mm irregular dark brown macule  Assessment & Plan   LENTIGINES,  HEMANGIOMAS - Benign normal skin lesions - Benign-appearing - Call for any changes  BENIGN MELANOCYTIC NEVI - Tan-brown and/or pink-flesh-colored symmetric macules and papules - Benign appearing on exam today - Observation - Call clinic for new or changing moles - Recommend daily use of broad spectrum spf 30+ sunscreen to sun-exposed areas.   MILD ACTINIC DAMAGE - Chronic condition, secondary to cumulative UV/sun exposure - diffuse scaly erythematous macules with underlying dyspigmentation - Recommend daily broad spectrum sunscreen SPF 30+ to sun-exposed areas, reapply every 2 hours  as needed.  - Staying in the shade or wearing long sleeves, sun glasses (UVA+UVB protection) and wide brim hats (4-inch brim around the entire circumference of the hat) are also recommended for sun protection.  - Call for new or changing lesions.  DERMATOFIBROMA Exam: Firm pink/brown papulenodule with dimple sign at L lower leg  Treatment Plan: A dermatofibroma is a benign growth possibly related to trauma, such as an insect bite, cut from shaving, or inflamed acne-type bump.  Treatment options to remove include shave or excision with resulting scar and risk of recurrence.  Since benign-appearing and not bothersome, will observe for now.    SKIN CANCER SCREENING PERFORMED TODAY NEOPLASM OF UNCERTAIN BEHAVIOR OF SKIN (2) Left inferior Neck - Skin / nail biopsy Type of biopsy: tangential   Informed consent: discussed and consent obtained   Timeout: patient name, date of birth, surgical site, and procedure verified   Procedure prep:  Patient was prepped and draped in usual sterile fashion Prep type:  Isopropyl alcohol Anesthesia: the lesion was anesthetized in a standard fashion   Anesthetic:  1% lidocaine w/ epinephrine 1-100,000 buffered w/ 8.4% NaHCO3 Instrument used: DermaBlade   Hemostasis achieved with: aluminum chloride   Outcome: patient tolerated procedure well   Post-procedure details: sterile dressing applied and wound care instructions given   Dressing type: petrolatum gauze and bandage    Specimen 1 - Surgical pathology Differential Diagnosis: r/o irritated nevus v SK  Check Margins: yes R mid back - Epidermal / dermal shaving  Lesion diameter (cm):  0.4 Informed consent: discussed and consent obtained   Timeout: patient name, date of  birth, surgical site, and procedure verified   Procedure prep:  Patient was prepped and draped in usual sterile fashion Prep type:  Isopropyl alcohol Anesthesia: the lesion was anesthetized in a standard fashion   Anesthetic:  1%  lidocaine w/ epinephrine 1-100,000 buffered w/ 8.4% NaHCO3 Instrument used: flexible razor blade   Hemostasis achieved with: pressure, aluminum chloride and electrodesiccation   Outcome: patient tolerated procedure well   Post-procedure details: sterile dressing applied and wound care instructions given   Dressing type: bandage and petrolatum    Specimen 2 - Surgical pathology Differential Diagnosis: r/o DN v MM  Check Margins: Yes LENTIGINES   ACTINIC SKIN DAMAGE   CHERRY ANGIOMA   DERMATOFIBROMA   MULTIPLE BENIGN NEVI   SCREENING EXAM FOR SKIN CANCER    Return in about 1 year (around 09/13/2025) for TBSE.   Documentation: I have reviewed the above documentation for accuracy and completeness, and I agree with the above.  I, Shirron Maranda, CMA II, am acting as scribe for:  Scott Fix K, PA-C    "

## 2024-09-13 NOTE — Patient Instructions (Addendum)

## 2024-09-14 LAB — SURGICAL PATHOLOGY

## 2024-09-19 ENCOUNTER — Ambulatory Visit: Payer: Self-pay | Admitting: Physician Assistant

## 2024-10-03 ENCOUNTER — Encounter: Payer: Self-pay | Admitting: Internal Medicine

## 2024-10-03 NOTE — Progress Notes (Unsigned)
 "   Subjective:    Patient ID: Margaret Deleon, female    DOB: [birth year], 44 y.o.   MRN: [id number]      HPI Margaret Deleon is here for a Physical exam and her chronic medical problems.   Overall doing well.  She still struggles with anxiety and depression at times.  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  She finds that sometimes if she gets depressed or something happen she has a hard time getting out of it.  She has difficulty concentrating.  She obsesses about certain things.  Her daughter was diagnosed with ADHD and the more she read about it she wondered if that something that she may have.   Medications and allergies reviewed with patient and updated if appropriate.  Medications Ordered Prior to Encounter[1]  Review of Systems  Constitutional:  Negative for fever.  Eyes:  Negative for visual disturbance.  Respiratory:  Negative for cough, shortness of breath and wheezing.   Cardiovascular:  Negative for chest pain, palpitations and leg swelling.  Gastrointestinal:  Negative for abdominal pain, blood in stool, constipation and diarrhea.       No gerd  Genitourinary:  Negative for dysuria.  Musculoskeletal:  Positive for back pain (chronic from work). Negative for arthralgias.  Skin:  Positive for pallor. Negative for rash.  Neurological:  Positive for headaches (occ). Negative for light-headedness.  Psychiatric/Behavioral:  Positive for decreased concentration and dysphoric mood. The patient is nervous/anxious and is hyperactive.        Objective:   Vitals:   10/04/24 1325  BP: 120/78  Pulse: 60  Temp: 98.3 F (36.8 C)  SpO2: 97%   Filed Weights   10/04/24 1325  Weight: 170 lb (77.1 kg)   Body mass index is 26.63 kg/m.  BP Readings from Last 3 Encounters:  10/04/24 120/78  09/13/24 123/83  08/16/24 106/62    Wt Readings from Last 3 Encounters:  10/04/24 170 lb (77.1 kg)  08/16/24 167  lb (75.8 kg)  05/24/24 161 lb (73 kg)       Physical Exam Constitutional: She appears well-developed and well-nourished. No distress.  HENT:  Head: Normocephalic and atraumatic.  Right Ear: External ear normal. Normal ear canal and TM Left Ear: External ear normal.  Normal ear canal and TM Mouth/Throat: Oropharynx is clear and moist.  Eyes: Conjunctivae normal.  Neck: Neck supple. No tracheal deviation present. No thyromegaly present.  No carotid bruit  Cardiovascular: Normal rate, regular rhythm and normal heart sounds.   No murmur heard.  No edema. Pulmonary/Chest: Effort normal and breath sounds normal. No respiratory distress. She has no wheezes. She has no rales.  Breast: deferred   Abdominal: Soft. She exhibits no distension. There is no tenderness.  Lymphadenopathy: She has no cervical adenopathy.  Skin: Skin is warm and dry. She is not diaphoretic.  Psychiatric: She has a normal mood and affect. Her behavior is normal.     Lab Results  Component Value Date   WBC 4.1 10/04/2024   HGB 13.0 10/04/2024   HCT 37.5 10/04/2024   PLT 266.0 10/04/2024   GLUCOSE 87 10/04/2024   CHOL 185 10/04/2024   TRIG 90.0 10/04/2024   HDL 69.50 10/04/2024   LDLCALC 98 10/04/2024   ALT 53 (H) 10/04/2024   AST 47 (H) 10/04/2024   NA 139 10/04/2024   K 4.3 10/04/2024   CL 104 10/04/2024  CREATININE 0.98 10/04/2024   BUN 9 10/04/2024   CO2 28 10/04/2024   TSH 1.51 10/04/2024   HGBA1C 5.0 10/07/2022         Assessment & Plan:   Physical exam: Screening blood work  ordered Exercise  regular - running/walking, weights Weight  is good Substance abuse  none   Reviewed recommended immunizations.   Health Maintenance  Topic Date Due   Cervical Cancer Screening (HPV/Pap Cotest)  Never done   COVID-19 Vaccine (1) 10/19/2024 (Originally 08/15/1981)   Mammogram  01/19/2025   DTaP/Tdap/Td (2 - Td or Tdap) 08/16/2026   HPV VACCINES (No Doses Required) Completed   Pneumococcal  Vaccine  Aged Out   Meningococcal B Vaccine  Aged Out   Influenza Vaccine  Discontinued   Hepatitis B Vaccines 19-59 Average Risk  Discontinued   Hepatitis C Screening  Discontinued   HIV Screening  Discontinued          See Problem List for Assessment and Plan of chronic medical problems.        [1]  Current Outpatient Medications on File Prior to Visit  Medication Sig Dispense Refill   AMBULATORY NON FORMULARY MEDICATION Nitroglycerin  ointment 0.125 %  Apply a pea sized amount internally two times daily for 6 to 8 weeks Dispense 30 GM zero refill 30 g 0   amLODipine -olmesartan  (AZOR ) 5-20 MG tablet TAKE 1 TABLET BY MOUTH DAILY 30 tablet 5   buPROPion  (WELLBUTRIN  XL) 150 MG 24 hr tablet TAKE 1 TABLET BY MOUTH EVERY DAY 90 tablet 1   cyclobenzaprine  (FLEXERIL ) 10 MG tablet TAKE 1/2 TO 1 TABLET(5 TO 10 MG) BY MOUTH AT BEDTIME 45 tablet 0   hydrocortisone  2.5 % cream Apply topically 2 (two) times daily as needed. APPLY EXTERNALLY TO THE AFFECTED AREA TWICE DAILY AS NEEDED 30 g 0   prochlorperazine  (COMPAZINE ) 10 MG tablet Take 1 tablet (10 mg total) by mouth every 8 (eight) hours as needed for refractory nausea / vomiting (refractory headache). 15 tablet 0   sertraline  (ZOLOFT ) 50 MG tablet Take 1 tablet (50 mg total) by mouth daily. 90 tablet 1   No current facility-administered medications on file prior to visit.   "

## 2024-10-03 NOTE — Patient Instructions (Addendum)
 "     Blood work was ordered.       Medications changes include :   None    A referral was ordered and someone will call you to schedule an appointment.     Return in about 6 months (around 04/03/2025) for follow up.     Health Maintenance, Female Adopting a healthy lifestyle and getting preventive care are important in promoting health and wellness. Ask your health care provider about: The right schedule for you to have regular tests and exams. Things you can do on your own to prevent diseases and keep yourself healthy. What should I know about diet, weight, and exercise? Eat a healthy diet  Eat a diet that includes plenty of vegetables, fruits, low-fat dairy products, and lean protein. Do not eat a lot of foods that are high in solid fats, added sugars, or sodium. Maintain a healthy weight Body mass index (BMI) is used to identify weight problems. It estimates body fat based on height and weight. Your health care provider can help determine your BMI and help you achieve or maintain a healthy weight. Get regular exercise Get regular exercise. This is one of the most important things you can do for your health. Most adults should: Exercise for at least 150 minutes each week. The exercise should increase your heart rate and make you sweat (moderate-intensity exercise). Do strengthening exercises at least twice a week. This is in addition to the moderate-intensity exercise. Spend less time sitting. Even light physical activity can be beneficial. Watch cholesterol and blood lipids Have your blood tested for lipids and cholesterol at 43 years of age, then have this test every 5 years. Have your cholesterol levels checked more often if: Your lipid or cholesterol levels are high. You are older than 43 years of age. You are at high risk for heart disease. What should I know about cancer screening? Depending on your health history and family history, you may need to have cancer  screening at various ages. This may include screening for: Breast cancer. Cervical cancer. Colorectal cancer. Skin cancer. Lung cancer. What should I know about heart disease, diabetes, and high blood pressure? Blood pressure and heart disease High blood pressure causes heart disease and increases the risk of stroke. This is more likely to develop in people who have high blood pressure readings or are overweight. Have your blood pressure checked: Every 3-5 years if you are 80-47 years of age. Every year if you are 70 years old or older. Diabetes Have regular diabetes screenings. This checks your fasting blood sugar level. Have the screening done: Once every three years after age 55 if you are at a normal weight and have a low risk for diabetes. More often and at a younger age if you are overweight or have a high risk for diabetes. What should I know about preventing infection? Hepatitis B If you have a higher risk for hepatitis B, you should be screened for this virus. Talk with your health care provider to find out if you are at risk for hepatitis B infection. Hepatitis C Testing is recommended for: Everyone born from [birth year] through 1964. Anyone with known risk factors for hepatitis C. Sexually transmitted infections (STIs) Get screened for STIs, including gonorrhea and chlamydia, if: You are sexually active and are younger than 43 years of age. You are older than 43 years of age and your health care provider tells you that you are at risk for this type of infection. Your  sexual activity has changed since you were last screened, and you are at increased risk for chlamydia or gonorrhea. Ask your health care provider if you are at risk. Ask your health care provider about whether you are at high risk for HIV. Your health care provider may recommend a prescription medicine to help prevent HIV infection. If you choose to take medicine to prevent HIV, you should first get tested for HIV. You  should then be tested every 3 months for as long as you are taking the medicine. Pregnancy If you are about to stop having your period (premenopausal) and you may become pregnant, seek counseling before you get pregnant. Take 400 to 800 micrograms (mcg) of folic acid every day if you become pregnant. Ask for birth control (contraception) if you want to prevent pregnancy. Osteoporosis and menopause Osteoporosis is a disease in which the bones lose minerals and strength with aging. This can result in bone fractures. If you are 46 years old or older, or if you are at risk for osteoporosis and fractures, ask your health care provider if you should: Be screened for bone loss. Take a calcium or vitamin D  supplement to lower your risk of fractures. Be given hormone replacement therapy (HRT) to treat symptoms of menopause. Follow these instructions at home: Alcohol use Do not drink alcohol if: Your health care provider tells you not to drink. You are pregnant, may be pregnant, or are planning to become pregnant. If you drink alcohol: Limit how much you have to: 0-1 drink a day. Know how much alcohol is in your drink. In the [place] one drink equals one 12 oz bottle of beer (355 mL), one 5 oz glass of wine (148 mL), or one 1 oz glass of hard liquor (44 mL). Lifestyle Do not use any products that contain nicotine or tobacco. These products include cigarettes, chewing tobacco, and vaping devices, such as e-cigarettes. If you need help quitting, ask your health care provider. Do not use street drugs. Do not share needles. Ask your health care provider for help if you need support or information about quitting drugs. General instructions Schedule regular health, dental, and eye exams. Stay current with your vaccines. Tell your health care provider if: You often feel depressed. You have ever been abused or do not feel safe at home. Summary Adopting a healthy lifestyle and getting preventive care are  important in promoting health and wellness. Follow your health care provider's instructions about healthy diet, exercising, and getting tested or screened for diseases. Follow your health care provider's instructions on monitoring your cholesterol and blood pressure. This information is not intended to replace advice given to you by your health care provider. Make sure you discuss any questions you have with your health care provider. Document Revised: 01/22/2021 Document Reviewed: 01/22/2021 Elsevier Patient Education  2024 [employer]. "

## 2024-10-04 ENCOUNTER — Ambulatory Visit: Admitting: Internal Medicine

## 2024-10-04 VITALS — BP 120/78 | HR 60 | Temp 98.3°F | Ht 67.0 in | Wt 170.0 lb

## 2024-10-04 DIAGNOSIS — F419 Anxiety disorder, unspecified: Secondary | ICD-10-CM

## 2024-10-04 DIAGNOSIS — E559 Vitamin D deficiency, unspecified: Secondary | ICD-10-CM

## 2024-10-04 DIAGNOSIS — F3289 Other specified depressive episodes: Secondary | ICD-10-CM

## 2024-10-04 DIAGNOSIS — I10 Essential (primary) hypertension: Secondary | ICD-10-CM | POA: Diagnosis not present

## 2024-10-04 DIAGNOSIS — Z0000 Encounter for general adult medical examination without abnormal findings: Secondary | ICD-10-CM | POA: Diagnosis not present

## 2024-10-04 DIAGNOSIS — R41840 Attention and concentration deficit: Secondary | ICD-10-CM

## 2024-10-04 DIAGNOSIS — F988 Other specified behavioral and emotional disorders with onset usually occurring in childhood and adolescence: Secondary | ICD-10-CM | POA: Insufficient documentation

## 2024-10-04 LAB — COMPREHENSIVE METABOLIC PANEL WITH GFR
ALT: 53 U/L — ABNORMAL HIGH (ref 3–35)
AST: 47 U/L — ABNORMAL HIGH (ref 5–37)
Albumin: 4.7 g/dL (ref 3.5–5.2)
Alkaline Phosphatase: 34 U/L — ABNORMAL LOW (ref 39–117)
BUN: 9 mg/dL (ref 6–23)
CO2: 28 meq/L (ref 19–32)
Calcium: 9.5 mg/dL (ref 8.4–10.5)
Chloride: 104 meq/L (ref 96–112)
Creatinine, Ser: 0.98 mg/dL (ref 0.40–1.20)
GFR: 71.13 mL/min
Glucose, Bld: 87 mg/dL (ref 70–99)
Potassium: 4.3 meq/L (ref 3.5–5.1)
Sodium: 139 meq/L (ref 135–145)
Total Bilirubin: 0.6 mg/dL (ref 0.2–1.2)
Total Protein: 7.3 g/dL (ref 6.0–8.3)

## 2024-10-04 LAB — LIPID PANEL
Cholesterol: 185 mg/dL (ref 28–200)
HDL: 69.5 mg/dL
LDL Cholesterol: 98 mg/dL (ref 10–99)
NonHDL: 115.77
Total CHOL/HDL Ratio: 3
Triglycerides: 90 mg/dL (ref 10.0–149.0)
VLDL: 18 mg/dL (ref 0.0–40.0)

## 2024-10-04 LAB — CBC
HCT: 37.5 % (ref 36.0–46.0)
Hemoglobin: 13 g/dL (ref 12.0–15.0)
MCHC: 34.5 g/dL (ref 30.0–36.0)
MCV: 96.8 fl (ref 78.0–100.0)
Platelets: 266 K/uL (ref 150.0–400.0)
RBC: 3.88 Mil/uL (ref 3.87–5.11)
RDW: 11.8 % (ref 11.5–15.5)
WBC: 4.1 K/uL (ref 4.0–10.5)

## 2024-10-04 LAB — TSH: TSH: 1.51 u[IU]/mL (ref 0.35–5.50)

## 2024-10-04 LAB — VITAMIN D 25 HYDROXY (VIT D DEFICIENCY, FRACTURES): VITD: 25.7 ng/mL — ABNORMAL LOW (ref 30.00–100.00)

## 2024-10-04 MED ORDER — AMPHETAMINE-DEXTROAMPHET ER 10 MG PO CP24: 10.0000 mg | ORAL_CAPSULE | Freq: Every day | ORAL | 0 refills | Status: AC

## 2024-10-04 NOTE — Assessment & Plan Note (Signed)
 Chronic Taking vitamin D daily Check vitamin D level

## 2024-10-04 NOTE — Assessment & Plan Note (Signed)
 Chronic Blood pressure well controlled CMP, cbc, lipids, tsh Continue amlodipine -olmesartan  5-20 mg daily

## 2024-10-04 NOTE — Assessment & Plan Note (Addendum)
 Chronic Fairly controlled, but still has anxiety Continue bupropion  XL 150 mg daily, sertraline  50 mg daily

## 2024-10-04 NOTE — Assessment & Plan Note (Signed)
 New Has several features of ADD Also with anxiety and depression  Will try Adderall XR 10 mg daily  - may need a higher dose

## 2024-10-04 NOTE — Assessment & Plan Note (Signed)
 Chronic Fairly controlled, but still has episodes of depression that she struggles with Continue  bupropion  XL 150 mg daily, sertraline  50 mg daily

## 2024-10-07 ENCOUNTER — Ambulatory Visit: Payer: Self-pay | Admitting: Internal Medicine

## 2024-10-07 DIAGNOSIS — R7989 Other specified abnormal findings of blood chemistry: Secondary | ICD-10-CM

## 2024-10-22 ENCOUNTER — Ambulatory Visit: Payer: Self-pay | Admitting: Internal Medicine

## 2024-10-22 ENCOUNTER — Other Ambulatory Visit

## 2024-10-22 DIAGNOSIS — R7989 Other specified abnormal findings of blood chemistry: Secondary | ICD-10-CM

## 2024-10-22 LAB — HEPATIC FUNCTION PANEL
ALT: 23 U/L (ref 3–35)
AST: 35 U/L (ref 5–37)
Albumin: 4.8 g/dL (ref 3.5–5.2)
Alkaline Phosphatase: 38 U/L — ABNORMAL LOW (ref 39–117)
Bilirubin, Direct: 0.1 mg/dL (ref 0.1–0.3)
Total Bilirubin: 0.6 mg/dL (ref 0.2–1.2)
Total Protein: 7.5 g/dL (ref 6.0–8.3)

## 2024-10-22 NOTE — Progress Notes (Unsigned)
" °  [facility] [address] [place] [address] Phone: [phone] Subjective:    I'm seeing this patient by the request  of:  Geofm Glade PARAS, MD  CC:   YEP:Dlagzrupcz  Margaret Deleon is a 44 y.o. female coming in with complaint of back and neck pain. OMT 08/16/2024. Patient states   Medications patient has been prescribed: None  Taking:         Reviewed prior external information including notes and imaging from previsou exam, outside providers and external EMR if available.   As well as notes that were available from care everywhere and other healthcare systems.  Past medical history, social, surgical and family history all reviewed in electronic medical record.  No pertanent information unless stated regarding to the chief complaint.   Past Medical History:  Diagnosis Date   Anxiety 09/17/2017   Depression    Elevated blood pressure reading 10/14/2017   Elevated blood pressure   Hypertension    Migraine headache 09/17/2017   Has been on propranolol  and labetalol in the past Did not tolerate Topamax  Takes Excedrin Migraine as needed, has not tried has tried Fioricet once   Migraines    Muscle tightness 09/17/2017   Neck pain 09/17/2017   Nonallopathic lesion of cervical region 10/06/2017   Nonallopathic lesion of lumbosacral region 10/06/2017   Nonallopathic lesion of thoracic region 10/06/2017   Palpitations 09/17/2017   Palpitations    Allergies[1]   Review of Systems:  No headache, visual changes, nausea, vomiting, diarrhea, constipation, dizziness, abdominal pain, skin rash, fevers, chills, night sweats, weight loss, swollen lymph nodes, body aches, joint swelling, chest pain, shortness of breath, mood changes. POSITIVE muscle aches  Objective  There were no vitals taken for this visit.   General: No apparent distress alert and oriented x3 mood and affect normal, dressed appropriately.  HEENT: Pupils equal, extraocular movements  intact  Respiratory: Patient's speak in full sentences and does not appear short of breath  Cardiovascular: No lower extremity edema, non tender, no erythema  Gait MSK:  Back   Osteopathic findings  C2 flexed rotated and side bent right C6 flexed rotated and side bent left T3 extended rotated and side bent right inhaled rib T9 extended rotated and side bent left L2 flexed rotated and side bent right Sacrum right on right       Assessment and Plan:  No problem-specific Assessment & Plan notes found for this encounter.    Nonallopathic problems  Decision today to treat with OMT was based on Physical Exam  After verbal consent patient was treated with HVLA, ME, FPR techniques in cervical, rib, thoracic, lumbar, and sacral  areas  Patient tolerated the procedure well with improvement in symptoms  Patient given exercises, stretches and lifestyle modifications  See medications in patient instructions if given  Patient will follow up in 4-8 weeks             Note: This dictation was prepared with Dragon dictation along with smaller phrase technology. Any transcriptional errors that result from this process are unintentional.            [1]  Allergies Allergen Reactions   Codeine Nausea And Vomiting   Hydrochlorothiazide  Rash   Prozac  [Fluoxetine  Hcl]     Increased anxiety   Topamax  [Topiramate ]     Felt weird, high like and tired   "

## 2024-10-25 ENCOUNTER — Ambulatory Visit: Admitting: Family Medicine

## 2025-04-04 ENCOUNTER — Ambulatory Visit: Admitting: Internal Medicine

## 2025-09-14 ENCOUNTER — Ambulatory Visit: Admitting: Physician Assistant
